# Patient Record
Sex: Female | Born: 1950
Health system: Southern US, Community
[De-identification: ages and names within clinical notes are randomized; demographics above are authoritative.]

## PROBLEM LIST (undated history)

## (undated) DIAGNOSIS — I10 Essential (primary) hypertension: Secondary | ICD-10-CM

## (undated) DIAGNOSIS — E119 Type 2 diabetes mellitus without complications: Secondary | ICD-10-CM

## (undated) DIAGNOSIS — E785 Hyperlipidemia, unspecified: Secondary | ICD-10-CM

## (undated) DIAGNOSIS — B009 Herpesviral infection, unspecified: Secondary | ICD-10-CM

## (undated) HISTORY — DX: Herpesviral infection, unspecified: B00.9

## (undated) HISTORY — DX: Essential (primary) hypertension: I10

## (undated) HISTORY — PX: WISDOM TOOTH EXTRACTION: SHX21

## (undated) HISTORY — DX: Hyperlipidemia, unspecified: E78.5

## (undated) HISTORY — PX: BREAST BIOPSY: SHX20

## (undated) HISTORY — DX: Type 2 diabetes mellitus without complications: E11.9

## (undated) HISTORY — PX: TUBAL LIGATION: SHX77

---

## 1994-06-12 HISTORY — PX: TOE SURGERY: SHX1073

## 1999-01-03 ENCOUNTER — Other Ambulatory Visit: Admission: RE | Admit: 1999-01-03 | Discharge: 1999-01-03 | Payer: Self-pay | Admitting: Obstetrics & Gynecology

## 1999-01-05 ENCOUNTER — Ambulatory Visit (HOSPITAL_COMMUNITY): Admission: RE | Admit: 1999-01-05 | Discharge: 1999-01-05 | Payer: Self-pay | Admitting: Obstetrics and Gynecology

## 1999-01-05 ENCOUNTER — Encounter: Payer: Self-pay | Admitting: Obstetrics and Gynecology

## 1999-01-10 ENCOUNTER — Ambulatory Visit (HOSPITAL_COMMUNITY): Admission: RE | Admit: 1999-01-10 | Discharge: 1999-01-10 | Payer: Self-pay | Admitting: Obstetrics and Gynecology

## 1999-01-10 ENCOUNTER — Encounter: Payer: Self-pay | Admitting: Obstetrics and Gynecology

## 1999-01-18 ENCOUNTER — Encounter: Payer: Self-pay | Admitting: Obstetrics and Gynecology

## 1999-01-18 ENCOUNTER — Ambulatory Visit (HOSPITAL_COMMUNITY): Admission: RE | Admit: 1999-01-18 | Discharge: 1999-01-18 | Payer: Self-pay | Admitting: Obstetrics and Gynecology

## 1999-01-18 ENCOUNTER — Encounter (INDEPENDENT_AMBULATORY_CARE_PROVIDER_SITE_OTHER): Payer: Self-pay | Admitting: Specialist

## 2000-04-23 ENCOUNTER — Other Ambulatory Visit: Admission: RE | Admit: 2000-04-23 | Discharge: 2000-04-23 | Payer: Self-pay | Admitting: Obstetrics and Gynecology

## 2001-02-06 ENCOUNTER — Ambulatory Visit (HOSPITAL_COMMUNITY): Admission: RE | Admit: 2001-02-06 | Discharge: 2001-02-06 | Payer: Self-pay | Admitting: Obstetrics and Gynecology

## 2001-02-06 ENCOUNTER — Encounter: Payer: Self-pay | Admitting: Obstetrics and Gynecology

## 2001-05-21 ENCOUNTER — Other Ambulatory Visit: Admission: RE | Admit: 2001-05-21 | Discharge: 2001-05-21 | Payer: Self-pay | Admitting: Obstetrics and Gynecology

## 2001-06-17 ENCOUNTER — Ambulatory Visit (HOSPITAL_COMMUNITY): Admission: RE | Admit: 2001-06-17 | Discharge: 2001-06-17 | Payer: Self-pay | Admitting: Gastroenterology

## 2002-11-06 ENCOUNTER — Encounter: Payer: Self-pay | Admitting: Obstetrics and Gynecology

## 2002-11-06 ENCOUNTER — Ambulatory Visit (HOSPITAL_COMMUNITY): Admission: RE | Admit: 2002-11-06 | Discharge: 2002-11-06 | Payer: Self-pay | Admitting: Obstetrics and Gynecology

## 2003-02-03 ENCOUNTER — Other Ambulatory Visit: Admission: RE | Admit: 2003-02-03 | Discharge: 2003-02-03 | Payer: Self-pay | Admitting: Obstetrics and Gynecology

## 2003-12-17 ENCOUNTER — Ambulatory Visit (HOSPITAL_COMMUNITY): Admission: RE | Admit: 2003-12-17 | Discharge: 2003-12-17 | Payer: Self-pay | Admitting: Obstetrics and Gynecology

## 2004-02-17 ENCOUNTER — Other Ambulatory Visit: Admission: RE | Admit: 2004-02-17 | Discharge: 2004-02-17 | Payer: Self-pay | Admitting: Obstetrics and Gynecology

## 2005-03-15 ENCOUNTER — Other Ambulatory Visit: Admission: RE | Admit: 2005-03-15 | Discharge: 2005-03-15 | Payer: Self-pay | Admitting: Obstetrics and Gynecology

## 2006-03-27 ENCOUNTER — Other Ambulatory Visit: Admission: RE | Admit: 2006-03-27 | Discharge: 2006-03-27 | Payer: Self-pay | Admitting: Obstetrics and Gynecology

## 2006-10-30 ENCOUNTER — Ambulatory Visit (HOSPITAL_COMMUNITY): Admission: RE | Admit: 2006-10-30 | Discharge: 2006-10-30 | Payer: Self-pay | Admitting: Obstetrics and Gynecology

## 2007-12-20 ENCOUNTER — Ambulatory Visit (HOSPITAL_COMMUNITY): Admission: RE | Admit: 2007-12-20 | Discharge: 2007-12-20 | Payer: Self-pay | Admitting: Obstetrics and Gynecology

## 2009-01-08 ENCOUNTER — Ambulatory Visit (HOSPITAL_COMMUNITY): Admission: RE | Admit: 2009-01-08 | Discharge: 2009-01-08 | Payer: Self-pay | Admitting: Obstetrics and Gynecology

## 2010-07-02 ENCOUNTER — Encounter: Payer: Self-pay | Admitting: Obstetrics and Gynecology

## 2010-07-03 ENCOUNTER — Encounter: Payer: Self-pay | Admitting: Obstetrics and Gynecology

## 2010-10-28 NOTE — Procedures (Signed)
[place]  Patient:    Renee [name], Renee [name] Visit Number: [id number] MRN: [id number]          Service Type: Attending:  Florencia Reasons, M.D. Dictated by:   Florencia Reasons, M.D. Proc. Date: 06/17/01   CC:         Lanora Manis A. Asencion Islam, M.D.  Janine Limbo, M.D.   Procedure Report  PROCEDURE:  Colonoscopy.  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  FINDINGS:  Normal exam to the cecum.  DESCRIPTION OF PROCEDURE:  The nature, purpose, and risks of the procedure had been discussed with the patient, who provided written consent.  Sedation was fentanyl 80 mcg and Versed 8 mg IV prior to and during the course of the procedure without arrhythmias or desaturation.  The Olympus adjustable-tension pediatric video colonoscope was advanced to the cecum, and pullback was then performed.  The quality of the prep was excellent, and it was felt that all areas were well-seen.  There was no particular difficulty in advancing the scope.  This was a normal examination.  No polyps, cancer, colitis, vascular malformations, or diverticulosis were noted.  Retroflexion was attempted in the rectum but could not be accomplished due to the small rectal ampulla.  No biopsies were obtained.  The patient tolerated the procedure well, and there were no apparent complications.  IMPRESSION:  Normal screening colonoscopy.  PLAN:  Consider screening colonoscopy again in five years. Dictated by:   Florencia Reasons, M.D. Attending:  Florencia Reasons, M.D. DD:  06/17/01 TD:  06/17/01 Job: [id number]

## 2010-12-05 ENCOUNTER — Other Ambulatory Visit: Payer: Self-pay | Admitting: Family Medicine

## 2010-12-06 ENCOUNTER — Other Ambulatory Visit: Payer: Self-pay | Admitting: Family Medicine

## 2010-12-06 ENCOUNTER — Other Ambulatory Visit: Payer: Self-pay

## 2010-12-06 DIAGNOSIS — R102 Pelvic and perineal pain: Secondary | ICD-10-CM

## 2011-01-02 ENCOUNTER — Other Ambulatory Visit (HOSPITAL_COMMUNITY): Payer: Self-pay | Admitting: Obstetrics and Gynecology

## 2011-01-02 DIAGNOSIS — Z1231 Encounter for screening mammogram for malignant neoplasm of breast: Secondary | ICD-10-CM

## 2011-01-03 ENCOUNTER — Ambulatory Visit (HOSPITAL_COMMUNITY)
Admission: RE | Admit: 2011-01-03 | Discharge: 2011-01-03 | Disposition: A | Discharge status: Home / Self Care | Payer: BC Managed Care – PPO | Source: Ambulatory Visit | Attending: Obstetrics and Gynecology | Admitting: Obstetrics and Gynecology

## 2011-01-03 DIAGNOSIS — Z1231 Encounter for screening mammogram for malignant neoplasm of breast: Secondary | ICD-10-CM

## 2012-01-26 ENCOUNTER — Other Ambulatory Visit: Payer: Self-pay | Admitting: Obstetrics and Gynecology

## 2012-01-26 DIAGNOSIS — Z1231 Encounter for screening mammogram for malignant neoplasm of breast: Secondary | ICD-10-CM

## 2012-02-20 ENCOUNTER — Ambulatory Visit (HOSPITAL_COMMUNITY): Payer: BC Managed Care – PPO | Attending: Obstetrics and Gynecology

## 2012-03-05 ENCOUNTER — Ambulatory Visit (HOSPITAL_COMMUNITY)
Admission: RE | Admit: 2012-03-05 | Discharge: 2012-03-05 | Disposition: A | Discharge status: Home / Self Care | Payer: BC Managed Care – PPO | Source: Ambulatory Visit | Attending: Obstetrics and Gynecology | Admitting: Obstetrics and Gynecology

## 2012-03-05 DIAGNOSIS — Z1231 Encounter for screening mammogram for malignant neoplasm of breast: Secondary | ICD-10-CM | POA: Insufficient documentation

## 2012-04-01 ENCOUNTER — Ambulatory Visit: Payer: BC Managed Care – PPO | Admitting: Obstetrics and Gynecology

## 2012-05-20 ENCOUNTER — Encounter: Payer: Self-pay | Admitting: Obstetrics and Gynecology

## 2012-05-20 ENCOUNTER — Ambulatory Visit (INDEPENDENT_AMBULATORY_CARE_PROVIDER_SITE_OTHER): Payer: BC Managed Care – PPO | Admitting: Obstetrics and Gynecology

## 2012-05-20 VITALS — BP 120/78 | Ht 64.6 in | Wt 181.0 lb

## 2012-05-20 DIAGNOSIS — Z01419 Encounter for gynecological examination (general) (routine) without abnormal findings: Secondary | ICD-10-CM

## 2012-05-20 NOTE — Progress Notes (Signed)
Subjective:    Renee [name] is a 61 y.o. female G2P2002 who presents for annual exam. The patient complaints of double vision.  She is scheduled to see a neurologist. The patient has a history of herpes but has infrequent outbreaks.  Work and home are going well at this point.  She is not sexually active.  The following portions of the patient's history were reviewed and updated as appropriate: allergies, current medications, past family history, past medical history, past social history, past surgical history and problem list.  Review of Systems Pertinent items are noted in HPI. Gastrointestinal:No change in bowel habits, no abdominal pain, no rectal bleeding Genitourinary:negative for dysuria, frequency, hematuria, nocturia and urinary incontinence    Objective:     BP 120/78  Ht 5' 4.6" (1.641 m)  Wt 181 lb (82.101 kg)  BMI 30.49 kg/m2  Weight:  Wt Readings from Last 1 Encounters:  05/20/12 181 lb (82.101 kg)     BMI: Body mass index is 30.49 kg/(m^2). General Appearance: Alert, appropriate appearance for age. No acute distress HEENT: Grossly normal Neck / Thyroid: Supple, no masses, nodes or enlargement Lungs: clear to auscultation bilaterally Back: No CVA tenderness Breast Exam: No masses or nodes.No dimpling, nipple retraction or discharge. Cardiovascular: Regular rate and rhythm. S1, S2, no murmur Gastrointestinal: Soft, non-tender, no masses or organomegaly  ++++++++++++++++++++++++++++++++++++++++++++++++++++++++  Pelvic Exam: External genitalia: normal general appearance Vaginal: normal without tenderness, induration or masses and relaxation noted Cervix: normal appearance Adnexa: normal bimanual exam Uterus: normal size shape and consistency Rectovaginal: normal rectal, no masses  ++++++++++++++++++++++++++++++++++++++++++++++++++++++++  Lymphatic Exam: Non-palpable nodes in neck, clavicular, axillary, or inguinal regions  Psychiatric: Alert and  oriented, appropriate affect.      Assessment:    Normal gyn exam Menopause   Family history of colon cancer  Overweight or obese: Yes  Pelvic relaxation: Yes  Menopausal symptoms: No. Severe: No.   Plan:    Mammogram. Pap smear. colonoscopy   Follow-up:  for annual exam  The updated Pap smear screening guidelines were discussed with the patient. The patient requested that I obtain a Pap smear: Yes.  Kegel exercises discussed: Yes.  Proper diet and regular exercise were reviewed.  Annual mammograms recommended starting at age 51. Proper breast care was discussed.  Screening colonoscopy is recommended beginning at age 66.  Regular health maintenance was reviewed.  Sleep hygiene was discussed.  Adequate calcium and vitamin D intake was emphasized.  The patient declines a prescription for Valtrex  Leonard Schwartz M.D.   Regular Periods: no Mammogram: yes  Monthly Breast Ex.: yes Exercise: yes  Tetanus < 10 years: yes Seatbelts: yes  NI. Bladder Functn.: yes Abuse at home: no  Daily BM's: no Stressful Work: no  Healthy Diet: yes Sigmoid-Colonoscopy: 2010  Calcium: yes Medical problems this year: Double vision    LAST PAP:04/2009  Contraception: BTL   Mammogram:  03/06/2012  [facility]P: [facility]   PMH: unchanged  FMH: unchanged   Last Bone Scan: never had one

## 2012-05-21 LAB — PAP IG W/ RFLX HPV ASCU

## 2012-05-22 DIAGNOSIS — R51 Headache: Secondary | ICD-10-CM | POA: Insufficient documentation

## 2012-05-22 DIAGNOSIS — H532 Diplopia: Secondary | ICD-10-CM | POA: Insufficient documentation

## 2012-05-22 DIAGNOSIS — R519 Headache, unspecified: Secondary | ICD-10-CM | POA: Insufficient documentation

## 2012-05-25 ENCOUNTER — Encounter: Payer: Self-pay | Admitting: *Deleted

## 2012-05-25 DIAGNOSIS — R51 Headache: Secondary | ICD-10-CM

## 2012-05-25 DIAGNOSIS — H532 Diplopia: Secondary | ICD-10-CM

## 2012-08-29 ENCOUNTER — Ambulatory Visit: Payer: Self-pay | Admitting: Neurology

## 2013-04-07 DIAGNOSIS — H209 Unspecified iridocyclitis: Secondary | ICD-10-CM | POA: Insufficient documentation

## 2013-06-20 ENCOUNTER — Other Ambulatory Visit: Payer: Self-pay | Admitting: Obstetrics and Gynecology

## 2013-06-20 DIAGNOSIS — Z1231 Encounter for screening mammogram for malignant neoplasm of breast: Secondary | ICD-10-CM

## 2013-06-23 ENCOUNTER — Ambulatory Visit (HOSPITAL_COMMUNITY): Payer: BC Managed Care – PPO

## 2013-06-24 ENCOUNTER — Ambulatory Visit (HOSPITAL_COMMUNITY)
Admission: RE | Admit: 2013-06-24 | Discharge: 2013-06-24 | Disposition: A | Discharge status: Home / Self Care | Payer: BC Managed Care – PPO | Source: Ambulatory Visit | Attending: Obstetrics and Gynecology | Admitting: Obstetrics and Gynecology

## 2013-06-24 DIAGNOSIS — Z1231 Encounter for screening mammogram for malignant neoplasm of breast: Secondary | ICD-10-CM | POA: Insufficient documentation

## 2013-06-26 ENCOUNTER — Other Ambulatory Visit: Payer: Self-pay | Admitting: Obstetrics and Gynecology

## 2013-06-26 DIAGNOSIS — R928 Other abnormal and inconclusive findings on diagnostic imaging of breast: Secondary | ICD-10-CM

## 2013-07-01 DIAGNOSIS — H5000 Unspecified esotropia: Secondary | ICD-10-CM | POA: Insufficient documentation

## 2013-07-07 ENCOUNTER — Ambulatory Visit
Admission: RE | Admit: 2013-07-07 | Discharge: 2013-07-07 | Disposition: A | Discharge status: Home / Self Care | Payer: BC Managed Care – PPO | Source: Ambulatory Visit | Attending: Obstetrics and Gynecology | Admitting: Obstetrics and Gynecology

## 2013-07-07 DIAGNOSIS — R928 Other abnormal and inconclusive findings on diagnostic imaging of breast: Secondary | ICD-10-CM

## 2013-08-11 ENCOUNTER — Other Ambulatory Visit: Payer: Self-pay | Admitting: Interventional Cardiology

## 2013-10-25 ENCOUNTER — Other Ambulatory Visit: Payer: Self-pay | Admitting: Interventional Cardiology

## 2014-01-07 ENCOUNTER — Other Ambulatory Visit: Payer: Self-pay | Admitting: Interventional Cardiology

## 2014-01-30 ENCOUNTER — Telehealth: Payer: Self-pay | Admitting: Interventional Cardiology

## 2014-01-30 NOTE — Telephone Encounter (Signed)
New message     Pt thinks she is having problems with her medication.  She says she has "breathing" problems sometimes.  Please advise.

## 2014-01-30 NOTE — Telephone Encounter (Signed)
returned pt call. pt sts that she is having increased fatigue, sob and a "little swelling"LE . pt denies chest pain, palpitations. pt believes her symptoms are related to Amlodipine.pt does not measure bp regularly.pt av to measure bp 1-2 wk and bring readings to o/v. pt is past due for f/u with Dr.Smith appt sch. for 9/18. pt adv to call if symptoms worsen. Adv pt Dr.Smith is back in the office on 8/26. I will fwd him a message and call back with his recommendations. Pt agreeable and verbalized understanding.

## 2014-02-09 ENCOUNTER — Other Ambulatory Visit: Payer: Self-pay | Admitting: Interventional Cardiology

## 2014-02-27 ENCOUNTER — Encounter: Payer: Self-pay | Admitting: Interventional Cardiology

## 2014-02-27 ENCOUNTER — Ambulatory Visit (INDEPENDENT_AMBULATORY_CARE_PROVIDER_SITE_OTHER): Payer: BC Managed Care – PPO | Admitting: Interventional Cardiology

## 2014-02-27 VITALS — BP 118/74 | HR 86 | Ht 64.0 in | Wt 185.0 lb

## 2014-02-27 DIAGNOSIS — R0609 Other forms of dyspnea: Secondary | ICD-10-CM

## 2014-02-27 DIAGNOSIS — E785 Hyperlipidemia, unspecified: Secondary | ICD-10-CM | POA: Insufficient documentation

## 2014-02-27 DIAGNOSIS — I10 Essential (primary) hypertension: Secondary | ICD-10-CM

## 2014-02-27 DIAGNOSIS — G4719 Other hypersomnia: Secondary | ICD-10-CM

## 2014-02-27 DIAGNOSIS — R0683 Snoring: Secondary | ICD-10-CM

## 2014-02-27 DIAGNOSIS — R0989 Other specified symptoms and signs involving the circulatory and respiratory systems: Secondary | ICD-10-CM

## 2014-02-27 DIAGNOSIS — G4710 Hypersomnia, unspecified: Secondary | ICD-10-CM

## 2014-02-27 MED ORDER — AMLODIPINE BESYLATE 5 MG PO TABS: ORAL_TABLET | ORAL | Status: DC

## 2014-02-27 MED ORDER — VALSARTAN-HYDROCHLOROTHIAZIDE 320-25 MG PO TABS: ORAL_TABLET | ORAL | Status: DC

## 2014-02-27 NOTE — Progress Notes (Signed)
Patient ID: Renee [name], female   DOB: [birth year], 63 y.o.   MRN: [id number]    [address] [place], [place]  [address] Phone: [phone] Fax:  [phone]  Date:  02/27/2014   ID:  Renee [name], DOB 1950-11-04, MRN [id number]  PCP:  Pcp Not In System   ASSESSMENT:  1. Hyperlipidemia 2. Hypertension 3. Double vision 4. Excessive daytime sleepiness associated with snoring  PLAN:  1. Consider sleep study 2. Lipid NMR 3. Renew antihypertensive therapy   SUBJECTIVE: Renee [name] is a 63 y.o. female who has an occasional hiccup and she wonders if this could be related to her heart. She also has complaint of excessive daytime sleepiness. Her children complaining that she snores loudly. She feels sleepy L. the time. There is no energy. She states in am responsible for prescribing her blood pressure medications. Has no significant cardiac complaints. She denies orthopnea and PND. She is having trouble with double vision and his are present.   Wt Readings from Last 3 Encounters:  02/27/14 185 lb (83.915 kg)  05/22/12 182 lb (82.555 kg)  05/20/12 181 lb (82.101 kg)     Past Medical History  Diagnosis Date  . Hypertension   . Hyperlipidemia   . HSV infection     Current Outpatient Prescriptions  Medication Sig Dispense Refill  . amLODipine (NORVASC) 5 MG tablet TAKE 1 TABLET EVERY DAY  30 tablet  0  . aspirin 81 MG tablet Take 81 mg by mouth daily.      . RESTASIS 0.05 % ophthalmic emulsion       . valACYclovir (VALTREX) 500 MG tablet       . valsartan-hydrochlorothiazide (DIOVAN-HCT) 320-25 MG per tablet TAKE 1 TABLET EVERY DAY  30 tablet  0   No current facility-administered medications for this visit.    Allergies:   No Known Allergies  Social History:  The patient  reports that she has never smoked. She has never used smokeless tobacco. She reports that she drinks alcohol. She reports that she does not use illicit drugs.    ROS:  Please see the history of present illness.   No neurological complaints of double vision. This was evaluated at [facility] and no brain lesion or evidence of cerebrovascular disease was identified. She denies peripheral edema.   All other systems reviewed and negative.   OBJECTIVE: VS:  BP 118/74  Pulse 86  Ht  (1.626 m)  Wt 185 lb (83.915 kg)  BMI 31.74 kg/m2 Well nourished, well developed, in no acute distress, wearing glasses and have a prism in the right lens HEENT: normal Neck: JVD flat. Carotid bruit absent  Cardiac:  normal S1, S2; RRR; no murmur Lungs:  clear to auscultation bilaterally, no wheezing, rhonchi or rales Abd: soft, nontender, no hepatomegaly Ext: Edema absent. Pulses 2+ Skin: warm and dry Neuro:  CNs 2-12 intact, no focal abnormalities noted  EKG:  Poor R-wave progression. Otherwise normal       Signed, Darci Needle III, MD 02/27/2014 4:40 PM

## 2014-02-27 NOTE — Patient Instructions (Signed)
Your physician recommends that you continue on your current medications as directed. Please refer to the Current Medication list given to you today.  Your physician recommends that you return for a FASTING NMR lipid profile: 03/03/14 between 7:30am-5:15pm  Your physician wants you to follow-up in: 1 year with Dr.Smith You will receive a reminder letter in the mail two months in advance. If you don't receive a letter, please call our office to schedule the follow-up appointment.

## 2014-03-03 ENCOUNTER — Other Ambulatory Visit: Payer: BC Managed Care – PPO

## 2014-04-13 ENCOUNTER — Encounter: Payer: Self-pay | Admitting: Interventional Cardiology

## 2014-04-29 DIAGNOSIS — H50021 Monocular esotropia with A pattern, right eye: Secondary | ICD-10-CM | POA: Insufficient documentation

## 2014-05-05 DIAGNOSIS — H04123 Dry eye syndrome of bilateral lacrimal glands: Secondary | ICD-10-CM | POA: Insufficient documentation

## 2014-11-17 ENCOUNTER — Ambulatory Visit (INDEPENDENT_AMBULATORY_CARE_PROVIDER_SITE_OTHER): Payer: Self-pay | Admitting: Neurology

## 2014-11-17 ENCOUNTER — Telehealth: Payer: Self-pay | Admitting: *Deleted

## 2014-11-17 ENCOUNTER — Encounter: Payer: Self-pay | Admitting: Neurology

## 2014-11-17 VITALS — BP 126/78 | HR 76 | Temp 97.8°F | Ht 64.0 in | Wt 182.4 lb

## 2014-11-17 DIAGNOSIS — F0781 Postconcussional syndrome: Secondary | ICD-10-CM

## 2014-11-17 NOTE — Telephone Encounter (Deleted)
Spoke with pt who stated she has not gone to get MRI and believes she "does not need MRI". I advised her that Dr. Ahern would like her to have this done and she then was confused between different tests she has had done and which doctor's have done which test. Unable to tell me where she was going for MRI for "her appointment on Tuesday next week". She stated "It's off of [address], I will know when I get there". I told her I would have to talk with Dr. Ahern and call her back. Pt verbalized understanding.   I also told her Dr. Wilson office would not give her something before her MRI because he wanted Dr. Ahern to do this since she is the once who ordered the MRI. Pt stated she was not aware and stated "I asked them to fill prescription at my pharmacy and that never happened". I told her Dr. Wilson office has been trying to contact her also about this issue and that I spoke with Gayle from their office about this. Pt aware.  

## 2014-11-17 NOTE — Telephone Encounter (Signed)
There is no order showing for this patient in the chart.
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
## 2014-11-17 NOTE — Patient Instructions (Signed)
Overall you are doing fairly well but I do want to suggest a few things today:   Remember to drink plenty of fluid, eat healthy meals and do not skip any meals. Try to eat protein with a every meal and eat a healthy snack such as fruit or nuts in between meals. Try to keep a regular sleep-wake schedule and try to exercise daily, particularly in the form of walking, 20-30 minutes a day, if you can.   I would like to see you back in 3 months, sooner if we need to. Please call us with any interim questions, concerns, problems, updates or refill requests.   Please also call us for any test results so we can go over those with you on the phone.  My clinical assistant and will answer any of your questions and relay your messages to me and also relay most of my messages to you.   Our phone number is [phone]. We also have an after hours call service for urgent matters and there is a physician on-call for urgent questions. For any emergencies you know to call 911 or go to the nearest emergency room   

## 2014-11-17 NOTE — Telephone Encounter (Signed)
I think there is a mistake. I just saw this patient today and I did not order an MRI. Please recheck chart and ensure this is the right patient, thanks.

## 2014-11-17 NOTE — Telephone Encounter (Signed)
Documentation made on 11/17/14 at 2:55pm and note was deleted on 11/17/14 at 3:56pm. This was intended for another pt chart. Dr. Lucia GaskinsAhern and Venita LickJodi Isom also made me aware of mistake.

## 2015-02-17 ENCOUNTER — Ambulatory Visit (INDEPENDENT_AMBULATORY_CARE_PROVIDER_SITE_OTHER): Payer: BC Managed Care – PPO | Admitting: Neurology

## 2015-02-17 ENCOUNTER — Encounter: Payer: Self-pay | Admitting: Neurology

## 2015-02-17 VITALS — BP 155/84 | HR 73 | Ht 64.0 in | Wt 185.0 lb

## 2015-02-17 DIAGNOSIS — F0781 Postconcussional syndrome: Secondary | ICD-10-CM

## 2015-02-17 NOTE — Patient Instructions (Signed)
Overall you are doing fairly well but I do want to suggest a few things today:   Remember to drink plenty of fluid, eat healthy meals and do not skip any meals. Try to eat protein with a every meal and eat a healthy snack such as fruit or nuts in between meals. Try to keep a regular sleep-wake schedule and try to exercise daily, particularly in the form of walking, 20-30 minutes a day, if you can.   As far as your medications are concerned, I would like to suggest  As far as diagnostic testing:   I would like to see you back as needed, sooner if we need to. Please call us with any interim questions, concerns, problems, updates or refill requests.   Please also call us for any test results so we can go over those with you on the phone.  My clinical assistant and will answer any of your questions and relay your messages to me and also relay most of my messages to you.   Our phone number is [phone]. We also have an after hours call service for urgent matters and there is a physician on-call for urgent questions. For any emergencies you know to call 911 or go to the nearest emergency room
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
## 2015-03-09 DIAGNOSIS — H50012 Monocular esotropia, left eye: Secondary | ICD-10-CM | POA: Insufficient documentation

## 2015-03-09 DIAGNOSIS — H4922 Sixth [abducent] nerve palsy, left eye: Secondary | ICD-10-CM | POA: Insufficient documentation

## 2015-04-02 ENCOUNTER — Other Ambulatory Visit: Payer: Self-pay | Admitting: Interventional Cardiology

## 2015-05-13 ENCOUNTER — Other Ambulatory Visit: Payer: Self-pay | Admitting: Interventional Cardiology

## 2015-05-14 ENCOUNTER — Ambulatory Visit: Payer: BC Managed Care – PPO | Admitting: Interventional Cardiology

## 2015-05-14 ENCOUNTER — Other Ambulatory Visit: Payer: Self-pay | Admitting: Obstetrics and Gynecology

## 2015-05-14 DIAGNOSIS — N6489 Other specified disorders of breast: Secondary | ICD-10-CM

## 2015-05-27 ENCOUNTER — Other Ambulatory Visit: Payer: Self-pay | Admitting: Interventional Cardiology

## 2015-06-16 NOTE — Progress Notes (Signed)
Cardiology Office Note   Date:  06/17/2015   ID:  Renee SkeensGwendolyn Gravlin-Wade, DOB July 22, 1950, MRN [id number]  PCP:  Irving CopasHACKER,ROBERT KELLER, MD  Cardiologist:  Lesleigh NoeSMITH III,Elder Davidian W, MD   Chief Complaint  Patient presents with  . Hypertension      History of Present Illness: Renee [name] is a [age] y.o. female who presents for extreme hyperlipidemia, essential hypertension, and recent history of right sixth nerve palsy.  Doing well. No orthopnea, PND, or chest discomfort. She has no edema or other cardiovascular complaints. She is concerned about when to take her antihypertensive therapy and also the influence of grapefruit juice on absorption  She asks if she is healthy enough to undergo surgery on her right eye for VI nerve palsy   Past Medical History  Diagnosis Date  . Hypertension   . Hyperlipidemia   . HSV infection     Past Surgical History  Procedure Laterality Date  . Tubal ligation    . Toe surgery  1996  . Wisdom tooth extraction       Current Outpatient Prescriptions  Medication Sig Dispense Refill  . amLODipine (NORVASC) 5 MG tablet Take 1 tablet (5 mg total) by mouth daily. 30 tablet 1  . aspirin 81 MG tablet Take 81 mg by mouth daily.    . diclofenac (VOLTAREN) 75 MG EC tablet Take 75 mg by mouth 2 (two) times daily with a meal.  0  . dorzolamide-timolol (COSOPT) 22.3-6.8 MG/ML ophthalmic solution Place 1 drop into both eyes daily.     . valsartan-hydrochlorothiazide (DIOVAN-HCT) 320-25 MG tablet Take 1 tablet by mouth daily. 30 tablet 0   No current facility-administered medications for this visit.    Allergies:   Review of patient's allergies indicates no known allergies.    Social History:  The patient  reports that she has never smoked. She has never used smokeless tobacco. She reports that she drinks alcohol. She reports that she does not use illicit drugs.   Family History:  The patient's family history includes Breast cancer in her  maternal aunt and sister; Colon cancer in her paternal uncle and paternal uncle; Hyperlipidemia in her maternal grandmother and mother; Hypertension in her maternal grandmother and mother; Lung cancer in her father.    ROS:  Please see the history of present illness.   Otherwise, review of systems are positive for double vision, eye discomfort, and anxiety..   All other systems are reviewed and negative.    PHYSICAL EXAM: VS:  BP 134/88 mmHg  Pulse 77  Ht 5' 4.5" (1.638 m)  Wt 189 lb (85.73 kg)  BMI 31.95 kg/m2 , BMI Body mass index is 31.95 kg/(m^2). GEN: Well nourished, well developed, in no acute distress HEENT: normal Neck: no JVD, carotid bruits, or masses Cardiac: RRR.  There is no murmur, rub, or gallop. There is no edema. Respiratory:  clear to auscultation bilaterally, normal work of breathing. GI: soft, nontender, nondistended, + BS MS: no deformity or atrophy Skin: warm and dry, no rash Neuro:  Strength and sensation are intact Psych: euthymic mood, full affect   EKG:  EKG is ordered today. The ekg reveals normal sinus rhythm with leftward axis and nonspecific T wave flattening.   Recent Labs: No results found for requested labs within last 365 days.    Lipid Panel No results found for: CHOL, TRIG, HDL, CHOLHDL, VLDL, LDLCALC, LDLDIRECT    Wt Readings from Last 3 Encounters:  06/17/15 189 lb (85.73 kg)  02/17/15  185 lb (83.915 kg)  11/17/14 182 lb 6.4 oz (82.736 kg)      Other studies Reviewed: Additional studies/ records that were reviewed today include: none. The findings include none.    ASSESSMENT AND PLAN:  1. Essential hypertension Excellent control  2. Hyperlipidemia There is a history of extreme lipid elevation. She has been reluctant to have therapy.  3. Snoring Still happening but not addressed  4. Right sixth nerve palsy She will have corrective surgery soon.   Current medicines are reviewed at length with the patient today.  The  patient has the following concerns regarding medicines: None.  The following changes/actions have been instituted:    Cardio IQ  Basic metabolic panel  Cleared for upcoming ophthalmologic surgery.  Labs/ tests ordered today include:  No orders of the defined types were placed in this encounter.     Disposition:   FU with HS in 1 year  Signed, Lesleigh Noe, MD  06/17/2015 2:24 PM    [facility] [address] [place], [place], [place]  [address] Phone: [phone]; Fax: [phone]

## 2015-06-17 ENCOUNTER — Encounter: Payer: Self-pay | Admitting: Interventional Cardiology

## 2015-06-17 ENCOUNTER — Ambulatory Visit (INDEPENDENT_AMBULATORY_CARE_PROVIDER_SITE_OTHER): Payer: BC Managed Care – PPO | Admitting: Interventional Cardiology

## 2015-06-17 VITALS — BP 134/88 | HR 77 | Ht 64.5 in | Wt 189.0 lb

## 2015-06-17 DIAGNOSIS — R0683 Snoring: Secondary | ICD-10-CM | POA: Diagnosis not present

## 2015-06-17 DIAGNOSIS — E785 Hyperlipidemia, unspecified: Secondary | ICD-10-CM | POA: Diagnosis not present

## 2015-06-17 DIAGNOSIS — I10 Essential (primary) hypertension: Secondary | ICD-10-CM

## 2015-06-17 NOTE — Patient Instructions (Signed)
Medication Instructions:  Your physician recommends that you continue on your current medications as directed. Please refer to the Current Medication list given to you today.   Labwork: Cardio IQ, Bmet today  Testing/Procedures: None ordered  Follow-Up: Your physician wants you to follow-up in: 1 year with Dr.Smith You will receive a reminder letter in the mail two months in advance. If you don't receive a letter, please call our office to schedule the follow-up appointment.   Any Other Special Instructions Will Be Listed Below (If Applicable).     If you need a refill on your cardiac medications before your next appointment, please call your pharmacy.

## 2015-06-18 LAB — BASIC METABOLIC PANEL
BUN: 13 mg/dL (ref 7–25)
CHLORIDE: 100 mmol/L (ref 98–110)
CO2: 30 mmol/L (ref 20–31)
CREATININE: 0.69 mg/dL (ref 0.50–0.99)
Calcium: 10.1 mg/dL (ref 8.6–10.4)
GLUCOSE: 120 mg/dL — AB (ref 65–99)
Potassium: 3.8 mmol/L (ref 3.5–5.3)
Sodium: 140 mmol/L (ref 135–146)

## 2015-06-23 LAB — CARDIO IQ(R) ADVANCED LIPID PANEL
Apolipoprotein B: 182 mg/dL — ABNORMAL HIGH (ref 49–103)
Cholesterol, Total: 346 mg/dL — ABNORMAL HIGH (ref 125–200)
Cholesterol/HDL Ratio: 4.9 calc (ref <=5.0)
HDL CHOLESTEROL (CARDIO IQ ADV LIPID PANEL): 71 mg/dL (ref >=46)
LDL CHOLESTEROL CALCULATED (CARDIO IQ ADV LIPID PANEL): 257 mg/dL — AB
LDL LARGE: 6287 nmol/L (ref 5038–17886)
LDL MEDIUM: 606 nmol/L — AB (ref 121–397)
LDL Particle Number: 2597 nmol/L — ABNORMAL HIGH (ref 1016–2185)
LDL Peak Size: 222.9 Angstrom (ref >=218.2)
LDL SMALL: 317 nmol/L (ref 115–386)
LIPOPROTEIN (A) (CARDIO IQ ADV LIPID PANEL): 104 nmol/L — AB (ref <75)
Non-HDL Cholesterol: 275 mg/dL
Triglycerides: 89 mg/dL

## 2015-06-28 ENCOUNTER — Other Ambulatory Visit: Payer: BC Managed Care – PPO

## 2015-06-28 ENCOUNTER — Telehealth: Payer: Self-pay

## 2015-06-28 DIAGNOSIS — E785 Hyperlipidemia, unspecified: Secondary | ICD-10-CM

## 2015-06-28 MED ORDER — ROSUVASTATIN CALCIUM 20 MG PO TABS: 20.0000 mg | ORAL_TABLET | Freq: Every day | ORAL | Status: DC

## 2015-06-28 NOTE — Telephone Encounter (Signed)
Pt aware of lab results and Dr.Smith's recommendation. Lipids are abnormal with multiple high risk features. I would recommend starting therapy. My suggestion would be Crestor 20 mg daily. This would prevent development of vascular obstruction in the future. Fasting liver and lipid in 6 weeks. After some discussion pt is agreeable with plan. Rx sent to pt pharmacy. Pt will call back to schedule her fasting lab appt for 6 weeks. Lab recall in epic.

## 2015-06-28 NOTE — Telephone Encounter (Signed)
-----   Message from Lyn RecordsHenry W Smith, MD sent at 06/21/2015  9:38 AM EST ----- Electrolytes are normal but blood sugar elevated. Needs to have this followed by PCP.

## 2015-07-02 ENCOUNTER — Ambulatory Visit
Admission: RE | Admit: 2015-07-02 | Discharge: 2015-07-02 | Disposition: A | Discharge status: Home / Self Care | Payer: BC Managed Care – PPO | Source: Ambulatory Visit | Attending: Obstetrics and Gynecology | Admitting: Obstetrics and Gynecology

## 2015-07-02 DIAGNOSIS — N6489 Other specified disorders of breast: Secondary | ICD-10-CM

## 2015-07-05 DIAGNOSIS — Z9889 Other specified postprocedural states: Secondary | ICD-10-CM | POA: Insufficient documentation

## 2015-07-24 ENCOUNTER — Other Ambulatory Visit: Payer: Self-pay | Admitting: Interventional Cardiology

## 2015-07-28 ENCOUNTER — Other Ambulatory Visit: Payer: Self-pay | Admitting: Interventional Cardiology

## 2015-12-07 ENCOUNTER — Telehealth: Payer: Self-pay | Admitting: *Deleted

## 2015-12-07 NOTE — Telephone Encounter (Signed)
Pt records and bills faxed to [employer] on 12/07/2015.

## 2016-05-29 ENCOUNTER — Encounter: Payer: BC Managed Care – PPO | Attending: Family Medicine | Admitting: *Deleted

## 2016-05-29 ENCOUNTER — Encounter: Payer: Self-pay | Admitting: *Deleted

## 2016-05-29 DIAGNOSIS — Z713 Dietary counseling and surveillance: Secondary | ICD-10-CM | POA: Diagnosis not present

## 2016-05-29 DIAGNOSIS — E785 Hyperlipidemia, unspecified: Secondary | ICD-10-CM

## 2016-05-29 DIAGNOSIS — I10 Essential (primary) hypertension: Secondary | ICD-10-CM

## 2016-05-29 DIAGNOSIS — E119 Type 2 diabetes mellitus without complications: Secondary | ICD-10-CM

## 2016-05-29 NOTE — Progress Notes (Signed)
Diabetes Self-Management Education  Visit Type: First/Initial  Appt. Start Time: 0830 Appt. End Time: 1000  05/29/2016  [name], identified by name and date of birth, is a 65 y.o. female with a diagnosis of Diabetes: Type 2.   ASSESSMENT  Daughter has type 1 diabetes.  Patient with history of dieting, meal skipping, emotional eating, smoothie cleanses (general disordered eating)      Diabetes Self-Management Education - 05/29/16 0843      Visit Information   Visit Type First/Initial     Initial Visit   Diabetes Type Type 2   Are you currently following a meal plan? No   Are you taking your medications as prescribed? Yes  sometimes forgets   Date Diagnosed 2017     Health Coping   How would you rate your overall health? Good     Psychosocial Assessment   Patient Belief/Attitude about Diabetes Motivated to manage diabetes   Self-care barriers None   Other persons present Patient   Patient Concerns Nutrition/Meal planning;Healthy Lifestyle  meal skipping and then overeating, emotional/binge eating   Special Needs None   Preferred Learning Style Auditory;Visual;Hands on   Learning Readiness Ready   How often do you need to have someone help you when you read instructions, pamphlets, or other written materials from your doctor or pharmacy? 1 - Never   What is the last grade level you completed in school? post graduate     Pre-Education Assessment   Patient understands the diabetes disease and treatment process. Needs Instruction   Patient understands incorporating nutritional management into lifestyle. Needs Instruction   Patient undertands incorporating physical activity into lifestyle. Needs Instruction   Patient understands using medications safely. Needs Instruction   Patient understands monitoring blood glucose, interpreting and using results Needs Instruction   Patient understands prevention, detection, and treatment of acute complications. Needs  Instruction   Patient understands prevention, detection, and treatment of chronic complications. Needs Instruction   Patient understands how to develop strategies to address psychosocial issues. Needs Instruction   Patient understands how to develop strategies to promote health/change behavior. Needs Instruction     Complications   Last HgB A1C per patient/outside source 6.6 %   How often do you check your blood sugar? 1-2 times/day   Fasting Blood glucose range (mg/dL) 16-109;[id number];130-179   Number of hypoglycemic episodes per month 0   Have you had a dilated eye exam in the past 12 months? Yes   Have you had a dental exam in the past 12 months? Yes   Are you checking your feet? No     Dietary Intake   Breakfast salmon, cantaloupe   Lunch peanut butter   Snack (afternoon) pistachios   Dinner 1/2 cornish hen, squash, onions tries to eat before 6     Exercise   Exercise Type Other (comment)  strength training   How many days per week to you exercise? 2   How many minutes per day do you exercise? 40   Total minutes per week of exercise 80     Patient Education   Previous Diabetes Education No   Disease state  Definition of diabetes, type 1 and 2, and the diagnosis of diabetes;Factors that contribute to the development of diabetes;Explored patient's options for treatment of their diabetes   Nutrition management  Role of diet in the treatment of diabetes and the relationship between the three main macronutrients and blood glucose level;Effects of alcohol on blood glucose and safety factors with  consumption of alcohol.;Meal options for control of blood glucose level and chronic complications.;Food label reading, portion sizes and measuring food.;Carbohydrate counting   Physical activity and exercise  Role of exercise on diabetes management, blood pressure control and cardiac health.;Helped patient identify appropriate exercises in relation to his/her diabetes, diabetes complications and other  health issue.   Medications Reviewed patients medication for diabetes, action, purpose, timing of dose and side effects.   Monitoring Yearly dilated eye exam;Identified appropriate SMBG and/or A1C goals.   Acute complications Taught treatment of hypoglycemia - the 15 rule.;Discussed and identified patients' treatment of hyperglycemia.   Chronic complications Relationship between chronic complications and blood glucose control   Personal strategies to promote health Lifestyle issues that need to be addressed for better diabetes care     Individualized Goals (developed by patient)   Nutrition Follow meal plan discussed;General guidelines for healthy choices and portions discussed   Physical Activity Exercise 3-5 times per week   Medications take my medication as prescribed   Monitoring  test my blood glucose as discussed   Reducing Risk examine blood glucose patterns     Post-Education Assessment   Patient understands the diabetes disease and treatment process. Needs Review   Patient understands incorporating nutritional management into lifestyle. Needs Review   Patient undertands incorporating physical activity into lifestyle. Needs Review   Patient understands using medications safely. Needs Review   Patient understands monitoring blood glucose, interpreting and using results Needs Review   Patient understands prevention, detection, and treatment of acute complications. Needs Review   Patient understands prevention, detection, and treatment of chronic complications. Needs Review   Patient understands how to develop strategies to address psychosocial issues. Needs Review   Patient understands how to develop strategies to promote health/change behavior. Needs Review     Outcomes   Expected Outcomes Demonstrated interest in learning. Expect positive outcomes   Future DMSE 4-6 wks   Program Status Not Completed      Individualized Plan for Diabetes Self-Management Training:   Learning  Objective:  Patient will have a greater understanding of diabetes self-management. Patient education plan is to attend individual and/or group sessions per assessed needs and concerns.   Plan:   Read Eat What you Love, Love what you Eat for Diabetes  Expected Outcomes:  Demonstrated interest in learning. Expect positive outcomes  Education material provided: Living Well with Diabetes and Meal plan card  If problems or questions, patient to contact team via:  Phone  Future DSME appointment: 4-6 wks

## 2016-06-23 ENCOUNTER — Other Ambulatory Visit: Payer: Self-pay | Admitting: Obstetrics and Gynecology

## 2016-06-23 DIAGNOSIS — Z1231 Encounter for screening mammogram for malignant neoplasm of breast: Secondary | ICD-10-CM

## 2016-06-29 ENCOUNTER — Ambulatory Visit: Payer: BC Managed Care – PPO | Admitting: *Deleted

## 2016-07-13 ENCOUNTER — Encounter: Payer: BC Managed Care – PPO | Attending: Family Medicine | Admitting: *Deleted

## 2016-07-13 DIAGNOSIS — E119 Type 2 diabetes mellitus without complications: Secondary | ICD-10-CM | POA: Diagnosis not present

## 2016-07-13 DIAGNOSIS — Z713 Dietary counseling and surveillance: Secondary | ICD-10-CM | POA: Insufficient documentation

## 2016-07-13 NOTE — Patient Instructions (Signed)
Aim for physical activity when able.  Goal is 150 min/week You're not getting enough to eat. Period.  Goal is 3 meals/day and snacks as needed Try reading Eat What You Love, Love What You Eat With Diabetes

## 2016-07-13 NOTE — Progress Notes (Signed)
Diabetes Self-Management Education  Visit Type:  Follow-up  Appt. Start Time: 0830 Appt. End Time: 0900  07/13/2016  Ms. Jocabed Gieske-Wade, identified by name and date of birth, is a 66 y.o. female with a diagnosis of Diabetes:  .   ASSESSMENT States she is checking glucose at night and it's normal, but high in the morning.       Diabetes Self-Management Education - 07/13/16 0836      Complications   How often do you check your blood sugar? 3-4 times/day   Fasting Blood glucose range (mg/dL) [phone]   Postprandial Blood glucose range (mg/dL) [phone]     Dietary Intake   Breakfast eggwhite and avocado; oatmeal and scrambled egg white   Lunch apple   Dinner stirfy with chicken; lentil soup      Learning Objective:  Patient will have a greater understanding of diabetes self-management. Patient education plan is to attend individual and/or group sessions per assessed needs and concerns.   Plan:   Patient Instructions  Aim for physical activity when able.  Goal is 150 min/week You're not getting enough to eat. Period.  Goal is 3 meals/day and snacks as needed Try reading Eat What You Love, Love What You Eat With Diabetes    If problems or questions, patient to contact team via:  Phone  Future DSME appointment: -  1 month

## 2016-07-14 ENCOUNTER — Ambulatory Visit
Admission: RE | Admit: 2016-07-14 | Discharge: 2016-07-14 | Disposition: A | Discharge status: Home / Self Care | Payer: BC Managed Care – PPO | Source: Ambulatory Visit | Attending: Obstetrics and Gynecology | Admitting: Obstetrics and Gynecology

## 2016-07-14 DIAGNOSIS — Z1231 Encounter for screening mammogram for malignant neoplasm of breast: Secondary | ICD-10-CM

## 2016-07-18 ENCOUNTER — Encounter: Payer: Self-pay | Admitting: Interventional Cardiology

## 2016-07-18 ENCOUNTER — Ambulatory Visit: Payer: BC Managed Care – PPO

## 2016-07-18 ENCOUNTER — Ambulatory Visit (INDEPENDENT_AMBULATORY_CARE_PROVIDER_SITE_OTHER): Payer: BC Managed Care – PPO | Admitting: Interventional Cardiology

## 2016-07-18 VITALS — BP 132/72 | HR 80 | Ht 64.0 in | Wt 178.4 lb

## 2016-07-18 DIAGNOSIS — I10 Essential (primary) hypertension: Secondary | ICD-10-CM

## 2016-07-18 DIAGNOSIS — E7849 Other hyperlipidemia: Secondary | ICD-10-CM

## 2016-07-18 DIAGNOSIS — E784 Other hyperlipidemia: Secondary | ICD-10-CM | POA: Diagnosis not present

## 2016-07-18 MED ORDER — VALSARTAN-HYDROCHLOROTHIAZIDE 320-25 MG PO TABS: 1.0000 | ORAL_TABLET | Freq: Every day | ORAL | 3 refills | Status: DC

## 2016-07-18 MED ORDER — AMLODIPINE BESYLATE 5 MG PO TABS: ORAL_TABLET | ORAL | 3 refills | Status: DC

## 2016-07-18 MED ORDER — ROSUVASTATIN CALCIUM 20 MG PO TABS: 20.0000 mg | ORAL_TABLET | ORAL | 3 refills | Status: DC

## 2016-07-18 NOTE — Patient Instructions (Signed)
Medication Instructions:  None  Labwork: None  Testing/Procedures: None  Follow-Up: Your physician wants you to follow-up in: 1 year with Dr. Katrinka [name].   You will receive a reminder letter in the mail two months in advance. If you don't receive a letter, please call our office to schedule the follow-up appointment.   Any Other Special Instructions Will Be Listed Below (If Applicable).  Please make sure you are exercising regularly and following a low salt diet.    Low-Sodium Eating Plan Sodium raises blood pressure and causes water to be held in the body. Getting less sodium from food will help lower your blood pressure, reduce any swelling, and protect your heart, liver, and kidneys. We get sodium by adding salt (sodium chloride) to food. Most of our sodium comes from canned, boxed, and frozen foods. Restaurant foods, fast foods, and pizza are also very high in sodium. Even if you take medicine to lower your blood pressure or to reduce fluid in your body, getting less sodium from your food is important. What is my plan? Most people should limit their sodium intake to 2,300 mg a day. Your health care provider recommends that you limit your sodium intake to __________ a day. What do I need to know about this eating plan? For the low-sodium eating plan, you will follow these general guidelines:  Choose foods with a % Daily Value for sodium of less than 5% (as listed on the food label).  Use salt-free seasonings or herbs instead of table salt or sea salt.  Check with your health care provider or pharmacist before using salt substitutes.  Eat fresh foods.  Eat more vegetables and fruits.  Limit canned vegetables. If you do use them, rinse them well to decrease the sodium.  Limit cheese to 1 oz (28 g) per day.  Eat lower-sodium products, often labeled as "lower sodium" or "no salt added."  Avoid foods that contain monosodium glutamate (MSG). MSG is sometimes added to Cong[place]inese food and  some canned foods.  Check food labels (Nutrition Facts labels) on foods to learn how much sodium is in one serving.  Eat more home-cooked food and less restaurant, buffet, and fast food.  When eating at a restaurant, ask that your food be prepared with less salt, or no salt if possible. How do I read food labels for sodium information? The Nutrition Facts label lists the amount of sodium in one serving of the food. If you eat more than one serving, you must multiply the listed amount of sodium by the number of servings. Food labels may also identify foods as:  Sodium free-Less than 5 mg in a serving.  Very low sodium-35 mg or less in a serving.  Low sodium-140 mg or less in a serving.  Light in sodium-50% less sodium in a serving. For example, if a food that usually has 300 mg of sodium is changed to become light in sodium, it will have 150 mg of sodium.  Reduced sodium-25% less sodium in a serving. For example, if a food that usually has 400 mg of sodium is changed to reduced sodium, it will have 300 mg of sodium. What foods can I eat? Grains  Low-sodium cereals, including oats, puffed wheat and rice, and shredded wheat cereals. Low-sodium crackers. Unsalted rice and pasta. Lower-sodium bread. Vegetables  Frozen or fresh vegetables. Low-sodium or reduced-sodium canned vegetables. Low-sodium or reduced-sodium tomato sauce and paste. Low-sodium or reduced-sodium tomato and vegetable juices. Fruits  Fresh, frozen, and canned fruit.  Fruit juice. Meat and Other Protein Products  Low-sodium canned tuna and salmon. Fresh or frozen meat, poultry, seafood, and fish. Lamb. Unsalted nuts. Dried beans, peas, and lentils without added salt. Unsalted canned beans. Homemade soups without salt. Eggs. Dairy  Milk. Soy milk. Ricotta cheese. Low-sodium or reduced-sodium cheeses. Yogurt. Condiments  Fresh and dried herbs and spices. Salt-free seasonings. Onion and garlic powders. Low-sodium varieties of  mustard and ketchup. Fresh or refrigerated horseradish. Lemon juice. Fats and Oils  Reduced-sodium salad dressings. Unsalted butter. Other  Unsalted popcorn and pretzels. The items listed above may not be a complete list of recommended foods or beverages. Contact your dietitian for more options.  What foods are not recommended? Grains  Instant hot cereals. Bread stuffing, pancake, and biscuit mixes. Croutons. Seasoned rice or pasta mixes. Noodle soup cups. Boxed or frozen macaroni and cheese. Self-rising flour. Regular salted crackers. Vegetables  Regular canned vegetables. Regular canned tomato sauce and paste. Regular tomato and vegetable juices. Frozen vegetables in sauces. Salted [place] fries. Olives. Rosita Fire. Relishes. Sauerkraut. Salsa. Meat and Other Protein Products  Salted, canned, smoked, spiced, or pickled meats, seafood, or fish. Bacon, ham, sausage, hot dogs, corned beef, chipped beef, and packaged luncheon meats. Salt pork. Jerky. Pickled herring. Anchovies, regular canned tuna, and sardines. Salted nuts. Dairy  Processed cheese and cheese spreads. Cheese curds. Blue cheese and cottage cheese. Buttermilk. Condiments  Onion and garlic salt, seasoned salt, table salt, and sea salt. Canned and packaged gravies. Worcestershire sauce. Tartar sauce. Barbecue sauce. Teriyaki sauce. Soy sauce, including reduced sodium. Steak sauce. Fish sauce. Oyster sauce. Cocktail sauce. Horseradish that you find on the shelf. Regular ketchup and mustard. Meat flavorings and tenderizers. Bouillon cubes. Hot sauce. Tabasco sauce. Marinades. Taco seasonings. Relishes. Fats and Oils  Regular salad dressings. Salted butter. Margarine. Ghee. Bacon fat. Other  Potato and tortilla chips. Corn chips and puffs. Salted popcorn and pretzels. Canned or dried soups. Pizza. Frozen entrees and pot pies. The items listed above may not be a complete list of foods and beverages to avoid. Contact your dietitian for more  information.  This information is not intended to replace advice given to you by your health care provider. Make sure you discuss any questions you have with your health care provider. Document Released: 11/18/2001 Document Revised: 11/04/2015 Document Reviewed: 04/02/2013 Elsevier Interactive Patient Education  2017 [employer].    If you need a refill on your cardiac medications before your next appointment, please call your pharmacy.

## 2016-07-18 NOTE — Progress Notes (Signed)
Cardiology Office Note    Date:  07/18/2016   ID:  Renee SkeensGwendolyn [name], DOB Jun 16, 1950, MRN [id number]  PCP:  Renee CopasHACKER,ROBERT KELLER, MD  Cardiologist: Lesleigh NoeHenry W Smith III, MD   Chief Complaint  Patient presents with  . Follow-up    BP    History of Present Illness:  Renee [name] is a 66 y.o. female who presents for extreme hyperlipidemia, essential hypertension, and recent history of right sixth nerve palsy.  She chose again stat in therapy despite markedly abnormal values.  Tolerating current medical regimen without problems.   Past Medical History:  Diagnosis Date  . Diabetes mellitus without complication (HCC)   . HSV infection   . Hyperlipidemia   . Hypertension     Past Surgical History:  Procedure Laterality Date  . TOE SURGERY  1996  . TUBAL LIGATION    . WISDOM TOOTH EXTRACTION      Current Medications: Outpatient Medications Prior to Visit  Medication Sig Dispense Refill  . amLODipine (NORVASC) 5 MG tablet TAKE 1 TABLET (5 MG TOTAL) BY MOUTH DAILY. 30 tablet 11  . aspirin 81 MG tablet Take 81 mg by mouth daily.    . dorzolamide-timolol (COSOPT) 22.3-6.8 MG/ML ophthalmic solution Place 1 drop into both eyes daily.     . metFORMIN (GLUCOPHAGE) 500 MG tablet Take by mouth 2 (two) times daily with a meal.    . valsartan-hydrochlorothiazide (DIOVAN-HCT) 320-25 MG tablet TAKE 1 TABLET BY MOUTH DAILY. 30 tablet 11  . diclofenac (VOLTAREN) 75 MG EC tablet Take 75 mg by mouth 2 (two) times daily with a meal.  0  . rosuvastatin (CRESTOR) 20 MG tablet Take 1 tablet (20 mg total) by mouth daily. (Patient not taking: Reported on 07/18/2016) 90 tablet 3   No facility-administered medications prior to visit.      Allergies:   Codeine   Social History   Social History  . Marital status: Divorced    Spouse name: N/A  . Number of children: 2  . Years of education: 16+   Social History Main Topics  . Smoking status: Never Smoker  . Smokeless tobacco:  Never Used  . Alcohol use 0.0 oz/week     Comment: Wine occass  . Drug use: No  . Sexual activity: No     Comment: BTL    Other Topics Concern  . None   Social History Narrative   Lives at home with herself with dog.   Caffeine use: Drink 2 cups coffee per day        Family History:  The patient's family history includes Breast cancer in her maternal aunt and sister; Cancer in her father, maternal aunt, paternal uncle, and sister; Colon cancer in her paternal uncle and paternal uncle; Diabetes in her daughter; Hyperlipidemia in her maternal grandmother and mother; Hypertension in her maternal grandmother and mother; Lung cancer in her father.   ROS:   Please see the history of present illness.    Recurring headaches.  All other systems reviewed and are negative.   PHYSICAL EXAM:   VS:  BP 132/72   Pulse 80   Ht 5\' 4"  (1.626 m)   Wt 178 lb 6.4 oz (80.9 kg)   SpO2 98%   BMI 30.62 kg/m    GEN: Well nourished, well developed, in no acute distress  HEENT: normal  Neck: no JVD, carotid bruits, or masses Cardiac: RRR; no murmurs, rubs, or gallops,no edema  Respiratory:  clear to auscultation bilaterally, normal work  of breathing GI: soft, nontender, nondistended, + BS MS: no deformity or atrophy  Skin: warm and dry, no rash Neuro:  Alert and Oriented x 3, Strength and sensation are intact Psych: euthymic mood, full affect  Wt Readings from Last 3 Encounters:  07/18/16 178 lb 6.4 oz (80.9 kg)  06/17/15 189 lb (85.7 kg)  02/17/15 185 lb (83.9 kg)      Studies/Labs Reviewed:   EKG:  EKG  Normal sinus rhythm, poor R-wave progression V1 through V6. Left axis deviation.  Recent Labs: No results found for requested labs within last 8760 hours.   Lipid Panel    Component Value Date/Time   CHOL 346 (H) 06/17/2015 1431   TRIG 89 06/17/2015 1431   HDL 71 06/17/2015 1431   CHOLHDL 4.9 06/17/2015 1431   LDLCALC 257 (H) 06/17/2015 1431    Additional studies/ records that  were reviewed today include:  None    ASSESSMENT:    1. Essential hypertension   2. Other hyperlipidemia      PLAN:  In order of problems listed above:  1. 2 g sodium diet, weight loss, aerobic activity, and continue monitoring blood pressure with target 140/90 mmHg or less. 2. We discussed the desire for primary prevention. She has not interested in therapy for lipids. She will attempt tighter diet control, exercise, and weight loss.    Medication Adjustments/Labs and Tests Ordered: Current medicines are reviewed at length with the patient today.  Concerns regarding medicines are outlined above.  Medication changes, Labs and Tests ordered today are listed in the Patient Instructions below. There are no Patient Instructions on file for this visit.   Signed, Lesleigh Noe, MD  07/18/2016 8:50 AM    [facility] [address] [place], [place], [place]  [address] Phone: [phone]; Fax: [phone]

## 2016-08-03 ENCOUNTER — Encounter: Payer: BC Managed Care – PPO | Admitting: *Deleted

## 2016-08-03 DIAGNOSIS — Z713 Dietary counseling and surveillance: Secondary | ICD-10-CM | POA: Diagnosis not present

## 2016-08-03 DIAGNOSIS — I10 Essential (primary) hypertension: Secondary | ICD-10-CM

## 2016-08-03 DIAGNOSIS — E7849 Other hyperlipidemia: Secondary | ICD-10-CM

## 2016-08-03 NOTE — Progress Notes (Signed)
Requested abbreviated appointment due to scheduling  Assessment:  Joined weight watchers to ensure that she is eating enough/eating regularly Shifted her larger meal to lunch.  Eats in the cafeteria and looks up the nutrition information ahead of time, then brings home large salad for dinner with protein. Also has snack of fruit This is working out well for her, but Still hyperglycemic in the morning  inadequate consumption at dinner/night.  Snacks mindlessly  Is trying to walk some  Intervention: eat enough.  Need 3 balanced meals and snacks each day in order to reduce binging at night  Follow up prn

## 2016-08-24 ENCOUNTER — Encounter: Payer: BC Managed Care – PPO | Attending: Family Medicine | Admitting: *Deleted

## 2016-08-24 DIAGNOSIS — E119 Type 2 diabetes mellitus without complications: Secondary | ICD-10-CM | POA: Diagnosis not present

## 2016-08-24 DIAGNOSIS — Z713 Dietary counseling and surveillance: Secondary | ICD-10-CM | POA: Insufficient documentation

## 2016-08-24 DIAGNOSIS — E7849 Other hyperlipidemia: Secondary | ICD-10-CM

## 2016-08-24 DIAGNOSIS — I10 Essential (primary) hypertension: Secondary | ICD-10-CM

## 2016-08-24 NOTE — Patient Instructions (Signed)
Dr Lubertha BasqueMaria Parades [facility] [phone]  Explore body movement you enjoy Keep working on nourishing your body.  Get enough carbs/food/fat!!  What will satisfy you

## 2016-08-24 NOTE — Progress Notes (Signed)
  Medical Nutrition Therapy:  Appt start time: 0830 end time:  0900.  Assessment:  Primary concerns today: here for follow up related to DSME, weight concerns.  When she isn't on a routine she eats more emotional and eating more treats and feels like she gained weight. She now feels more in control.  She is more structured and on a schedule Feels more satisfied and doesn't still feeling tempted to snack Is drinking more water (48 oz) Is learning more about her body and realizes what happens when she doesn't get enough during the day  MEDICATIONS: see list  DIETARY INTAKE:  24-hr recall:  B ( AM): multigrain bread with avocado and 2 slices canandian bacon or 2 eggs, 2 slices canandian bacon with veggies and 1 slice toast  Snk ( AM) :fruit  L ( PM): goes to cafeteria and eats half - black beans, brown rice, breakfast sausage Snk (PM): fruit D (PM): eats the other half of her lunch  Snk  PM): cheese or nuts if she restricted during the day Beverages: water  Recent physical activity: none    Nutritional Diagnosis:  NB-1.1 Food and nutrition-related knowledge deficit As related to how to nourish herself and manage metabolic health.  As evidenced by self-report.    Intervention:  Nutrition counseling provided.  Suggested [facility]. Discussed meeting her energy needs during the day.  Debunked diet myths.  Stressed need for adequate fuel Discussed hoe exercise can help with metabolic health Discussed progression of diabetes and challenged medication stigma    Monitoring/Evaluation:  Dietary intake, exercise, glucose, and body weight prn.

## 2016-09-12 DIAGNOSIS — H40003 Preglaucoma, unspecified, bilateral: Secondary | ICD-10-CM | POA: Insufficient documentation

## 2017-07-04 ENCOUNTER — Ambulatory Visit: Payer: BC Managed Care – PPO | Admitting: Podiatry

## 2017-07-04 ENCOUNTER — Encounter: Payer: Self-pay | Admitting: Podiatry

## 2017-07-04 ENCOUNTER — Other Ambulatory Visit: Payer: Self-pay | Admitting: Podiatry

## 2017-07-04 ENCOUNTER — Ambulatory Visit (INDEPENDENT_AMBULATORY_CARE_PROVIDER_SITE_OTHER): Payer: BC Managed Care – PPO

## 2017-07-04 VITALS — BP 114/72 | HR 74 | Resp 16

## 2017-07-04 DIAGNOSIS — M779 Enthesopathy, unspecified: Secondary | ICD-10-CM

## 2017-07-04 DIAGNOSIS — M79671 Pain in right foot: Secondary | ICD-10-CM

## 2017-07-04 DIAGNOSIS — L600 Ingrowing nail: Secondary | ICD-10-CM

## 2017-07-04 NOTE — Patient Instructions (Signed)

## 2017-07-04 NOTE — Progress Notes (Signed)
   Subjective:    Patient ID: Renee SkeensGwendolyn [name], female    DOB: April 03, 1951, 67 y.o.   MRN: [id number]  HPI    Review of Systems  All other systems reviewed and are negative.      Objective:   Physical Exam        Assessment & Plan:

## 2017-07-04 NOTE — Progress Notes (Signed)
Subjective:   Patient ID: Renee [name], female   DOB: 67 y.o.   MRN: [id number]   HPI Patient presents stating that she is had nail disease and she has an ingrown toenail of her right big toe that has to be worked on all the time with only temporary relief.  Patient states it is sore when pressed and makes shoe gear difficult.  Also she does have a complaint of her mid arch area right over left stating that it is inflamed and it hard when she walks.  Patient does not smoke and likes to be active   Review of Systems  All other systems reviewed and are negative.       Objective:  Physical Exam  Constitutional: She appears well-developed and well-nourished.  Cardiovascular: Intact distal pulses.  Pulmonary/Chest: Effort normal.  Musculoskeletal: Normal range of motion.  Neurological: She is alert.  Skin: Skin is warm.  Nursing note and vitals reviewed.   Neurovascular status intact muscle strength adequate range of motion within normal limits with patient noted to have exquisite discomfort in the medial border of the right hallux that is painful when pressed with no redness or drainage noted and just distal soreness.  Patient is noted to have discomfort around the navicular right over left with moderate depression of the arch noted     Assessment:  Chronic ingrown toenail deformity right hallux medial border with patient also noted to have probable tendinitis secondary to foot structure     Plan:  H&P condition reviewed and at this time x-ray reviewed with patient.  I did dispense fascial brace to lift the arch I discussed correction of the ingrown toenail and she wants this fixed.  I explained procedure and risk associated with surgery and she is willing to accept risk and today I infiltrated the right hallux 60 mg Xylocaine Marcaine mixture removed medial border exposed matrix and applied phenol 3 applications 30 seconds followed by alcohol lavage and sterile dressing.  Given  instructions on soaks and reappoint and also discussed long-term orthotics  X-ray indicates moderate depression of the arch with no indications of advanced arthritis or stress fracture

## 2017-07-18 ENCOUNTER — Ambulatory Visit: Payer: BC Managed Care – PPO | Admitting: Podiatry

## 2017-07-18 ENCOUNTER — Encounter: Payer: Self-pay | Admitting: Podiatry

## 2017-07-18 DIAGNOSIS — M7750 Other enthesopathy of unspecified foot: Secondary | ICD-10-CM | POA: Diagnosis not present

## 2017-07-18 DIAGNOSIS — L600 Ingrowing nail: Secondary | ICD-10-CM

## 2017-07-18 DIAGNOSIS — M779 Enthesopathy, unspecified: Secondary | ICD-10-CM

## 2017-07-18 NOTE — Progress Notes (Signed)
Subjective:   Patient ID: Lorne SkeensGwendolyn Macrae-Wade, female   DOB: 67 y.o.   MRN: [id number]   HPI Patient states the nail seems to be healing well and she would like orthotics as the brace did feel good lifting her arch up to like a pair for tennis shoes and also for dress shoes that she has to wear different shoes for work   ROS      Objective:  Physical Exam  Neurovascular status intact with patient's right nail site medial border healing well and crusted over with moderate depression of the arch with fascial brace which is been helpful     Assessment:  Tendinitis-like symptoms with moderate collapse of the arch as part of the pathological process with well-healing nail site right big toe     Plan:  Patient overall is doing well and at this point I have recommended long-term orthotics to provide for more consistent support.  Patient wants orthotics and is scanned for customized orthotics at the current time and will have a second pair made for dress shoes when the first pair is appropriate.  Reappoint to recheck when orthotics are ready by ped orthotist and scanning done today

## 2017-07-19 ENCOUNTER — Ambulatory Visit: Payer: BC Managed Care – PPO | Admitting: Orthotics

## 2017-07-19 DIAGNOSIS — M779 Enthesopathy, unspecified: Secondary | ICD-10-CM

## 2017-07-19 NOTE — Progress Notes (Signed)
Discussed f/o w patient and the value of said orthotics.  Also educated on different types

## 2017-07-30 ENCOUNTER — Other Ambulatory Visit: Payer: Self-pay | Admitting: Interventional Cardiology

## 2017-08-04 ENCOUNTER — Other Ambulatory Visit: Payer: Self-pay | Admitting: Interventional Cardiology

## 2017-08-06 ENCOUNTER — Other Ambulatory Visit: Payer: BC Managed Care – PPO | Admitting: Orthotics

## 2017-08-06 NOTE — Telephone Encounter (Signed)
Medication Detail    Disp Refills Start End    amLODipine (NORVASC) 5 MG tablet 30 tablet 0 07/30/2017    Sig: TAKE 1 TABLET (5 MG TOTAL) BY MOUTH DAILY.   Sent to pharmacy as: amLODipine (NORVASC) 5 MG tablet   Notes to Pharmacy: Please call the office at [phone] to set up yearly OV for further refills. Thank you. 1st attempt.   E-Prescribing Status: Receipt confirmed by pharmacy (07/30/2017 12:33 PM EST)    Pharmacy  [id number] - [place], [place] - [address]

## 2017-08-15 ENCOUNTER — Ambulatory Visit: Payer: BC Managed Care – PPO | Admitting: Nurse Practitioner

## 2017-08-15 ENCOUNTER — Encounter: Payer: Self-pay | Admitting: Nurse Practitioner

## 2017-08-15 ENCOUNTER — Encounter (INDEPENDENT_AMBULATORY_CARE_PROVIDER_SITE_OTHER): Payer: Self-pay

## 2017-08-15 ENCOUNTER — Ambulatory Visit
Admission: RE | Admit: 2017-08-15 | Discharge: 2017-08-15 | Disposition: A | Discharge status: Home / Self Care | Payer: Self-pay | Source: Ambulatory Visit | Attending: Nurse Practitioner | Admitting: Nurse Practitioner

## 2017-08-15 VITALS — BP 132/90 | HR 90 | Ht 64.0 in | Wt 183.8 lb

## 2017-08-15 DIAGNOSIS — I10 Essential (primary) hypertension: Secondary | ICD-10-CM

## 2017-08-15 DIAGNOSIS — E7849 Other hyperlipidemia: Secondary | ICD-10-CM

## 2017-08-15 NOTE — Progress Notes (Signed)
CARDIOLOGY OFFICE NOTE  Date:  08/15/2017    Renee [name] Date of Birth: 25-Jun-1950 Medical Record #[id number]  PCP:  Renee Screws, MD  Cardiologist:  Renee [name]   Chief Complaint  Patient presents with  . Hypertension  . Hyperlipidemia    1 [name] check - seen for Renee [name]    History of Present Illness: Renee [name] is a 67 y.o. female who presents today for a one [name] check. Seen for Renee [name].   She has a history of extreme hyperlipidemia, essential hypertension, and history of right sixth nerve palsy. Noted that she declined statin therapy in the past despite her markedly abnormal values.   Last seen in February of 2018 - felt to be doing ok.   Comes in today. Here alone. Notes she now has diabetes - says she is "still in denial about this". Taking Metformin. She has tried statin therapy - labs from January noted - LDL still pretty high. She felt bad on Crestor - says "felt like an old lady". Now on Lipitor and still feels bad. Reports that she has been advised to increase her dose to 40 of Lipitor - she is not willing to be "unfunctional". Asking for referral for lipid clinic. Apparently has had issues with cholesterol since in her 20's. Notes a pressure sensation in her chest - attributes this to indigestion. Nothing exertional. Admits she is overweight. Limited by orthopedic issues. She has had her lot of Valsartan changed out. She says she wants to lose 20#. She knows she gets too much sugar.   Past Medical History:  Diagnosis Date  . Diabetes mellitus without complication (HCC)   . HSV infection   . Hyperlipidemia   . Hypertension     Past Surgical History:  Procedure Laterality Date  . TOE SURGERY  1996  . TUBAL LIGATION    . WISDOM TOOTH EXTRACTION       Medications: Current Meds  Medication Sig  . amLODipine (NORVASC) 5 MG tablet TAKE 1 TABLET (5 MG TOTAL) BY MOUTH DAILY.  Marland Kitchen aspirin 81 MG tablet Take 81 mg by mouth daily.    Marland Kitchen atorvastatin (LIPITOR) 20 MG tablet atorvastatin 20 mg tablet daily  . dorzolamide-timolol (COSOPT) 22.3-6.8 MG/ML ophthalmic solution Place 1 drop into both eyes daily.   . metFORMIN (GLUCOPHAGE) 500 MG tablet Take by mouth 2 (two) times daily with a meal.  . valsartan-hydrochlorothiazide (DIOVAN-HCT) 320-25 MG tablet Take 1 tablet by mouth daily.     Allergies: Allergies  Allergen Reactions  . Codeine Nausea Only    Social History: The patient  reports that  has never smoked. she has never used smokeless tobacco. She reports that she drinks alcohol. She reports that she does not use drugs.   Family History: The patient's family history includes Breast cancer in her maternal aunt and sister; Cancer in her father, maternal aunt, paternal uncle, and sister; Colon cancer in her paternal uncle and paternal uncle; Diabetes in her daughter; Hyperlipidemia in her maternal grandmother and mother; Hypertension in her maternal grandmother and mother; Lung cancer in her father.   Review of Systems: Please see the history of present illness.   Otherwise, the review of systems is positive for none.   All other systems are reviewed and negative.   Physical Exam: VS:  BP 132/90 (BP Location: Left Arm, Patient Position: Sitting, Cuff Size: Normal)   Pulse 90   Ht 5\' 4"  (1.626 m)   Wt 183 lb  12.8 oz (83.4 kg)   BMI 31.55 kg/m  .  BMI Body mass index is 31.55 kg/m.  Wt Readings from Last 3 Encounters:  08/15/17 183 lb 12.8 oz (83.4 kg)  07/18/16 178 lb 6.4 oz (80.9 kg)  06/17/15 189 lb (85.7 kg)    General: Pleasant. Well developed, well nourished and in no acute distress.   HEENT: Normal.  Neck: Supple, no JVD, carotid bruits, or masses noted.  Cardiac: Regular rate and rhythm. No murmurs, rubs, or gallops. No edema.  Respiratory:  Lungs are clear to auscultation bilaterally with normal work of breathing.  GI: Soft and nontender.  MS: No deformity or atrophy. Gait and ROM intact.   Skin: Warm and dry. Color is normal.  Neuro:  Strength and sensation are intact and no gross focal deficits noted.  Psych: Alert, appropriate and with normal affect.   LABORATORY DATA:  EKG:  EKG is ordered today. This demonstrates NSR.  Lab Results  Component Value Date   GLUCOSE 120 (H) 06/17/2015   CHOL 346 (H) 06/17/2015   TRIG 89 06/17/2015   HDL 71 06/17/2015   LDLCALC 257 (H) 06/17/2015   NA 140 06/17/2015   K 3.8 06/17/2015   CL 100 06/17/2015   CREATININE 0.69 06/17/2015   BUN 13 06/17/2015   CO2 30 06/17/2015       BNP (last 3 results) No results for input(s): BNP in the last 8760 hours.  ProBNP (last 3 results) No results for input(s): PROBNP in the last 8760 hours.   Other Studies Reviewed Today:   Assessment/Plan:  1. HTN - BP ok on current regimen. Her lot has been changed on her Valsartan.   2. HLD - she does not like taking statin due to the way it makes her feel. She is asking for referral to the lipid clinic. She is also interested in having calcium scoring.   3. Obesity - discussed at length - my tips given today. She knows her downfalls and how to improve.    Current medicines are reviewed with the patient today.  The patient does not have concerns regarding medicines other than what has been noted above.  The following changes have been made:  See above.  Labs/ tests ordered today include:    Orders Placed This Encounter  Procedures  . CT CARDIAC SCORING  . EKG 12-Lead     Disposition:   FU with Dr. Katrinka [name] in one [name].    Patient is agreeable to this plan and will call if any problems develop in the interim.   SignedNorma [name]: Renee Demary, NP  08/15/2017 11:15 AM  [facility] [address] [place], [place]  [address] Phone: [phone] Fax: [phone]

## 2017-08-15 NOTE — Patient Instructions (Addendum)
We will be checking the following labs today - NONE   Medication Instructions:    Continue with your current medicines.     Testing/Procedures To Be Arranged:  Calcium scoring study please  Follow-Up:   See Dr. Katrinka BlazingSmith in one year    Other Special Instructions:   Think about what we talked about today    If you need a refill on your cardiac medications before your next appointment, please call your pharmacy.   Call the [facility] office at [phone] if you have any questions, problems or concerns.

## 2017-08-20 ENCOUNTER — Other Ambulatory Visit: Payer: Self-pay | Admitting: Nurse Practitioner

## 2017-08-20 DIAGNOSIS — I7789 Other specified disorders of arteries and arterioles: Secondary | ICD-10-CM

## 2017-08-20 DIAGNOSIS — R931 Abnormal findings on diagnostic imaging of heart and coronary circulation: Secondary | ICD-10-CM

## 2017-08-20 DIAGNOSIS — R079 Chest pain, unspecified: Secondary | ICD-10-CM

## 2017-08-21 ENCOUNTER — Encounter: Payer: Self-pay | Admitting: *Deleted

## 2017-08-21 ENCOUNTER — Encounter (INDEPENDENT_AMBULATORY_CARE_PROVIDER_SITE_OTHER): Payer: Self-pay

## 2017-08-21 ENCOUNTER — Telehealth: Payer: Self-pay | Admitting: *Deleted

## 2017-08-21 ENCOUNTER — Other Ambulatory Visit: Payer: Self-pay | Admitting: *Deleted

## 2017-08-21 MED ORDER — METOPROLOL TARTRATE 50 MG PO TABS: 25.0000 mg | ORAL_TABLET | Freq: Once | ORAL | 0 refills | Status: DC

## 2017-08-21 NOTE — Telephone Encounter (Signed)
lvm for pt, Renee [name] s/w Dr. Katrinka BlazingSmith, per pt about upcoming procedure and statins.  Dr. Katrinka BlazingSmith stated  He agreed with CTA, agreed with statin recommendation/high intensity/goal of LDL at 70. Stated to many risk factors.

## 2017-08-22 NOTE — Telephone Encounter (Signed)
lvm per (DPR) with Dr.Smith's recommendation's.  Will call back if pt wants to schedule lipid clinic appt.

## 2017-08-22 NOTE — Telephone Encounter (Signed)
Follow UP:     Returning Renee [name]'s call from yesterday.She will not be available today from 1:00 to 2:00.

## 2017-08-24 ENCOUNTER — Other Ambulatory Visit: Payer: BC Managed Care – PPO | Admitting: Orthotics

## 2017-08-30 ENCOUNTER — Ambulatory Visit (INDEPENDENT_AMBULATORY_CARE_PROVIDER_SITE_OTHER): Payer: BC Managed Care – PPO | Admitting: Pharmacist

## 2017-08-30 DIAGNOSIS — E7849 Other hyperlipidemia: Secondary | ICD-10-CM

## 2017-08-30 NOTE — Progress Notes (Signed)
Patient ID: Renee [name]                 DOB: January 05, 1951                    MRN: [id number]     HPI: Renee [name] is a 67 y.o. female patient of Dr. Katrinka BlazingSmith that presents today for lipid evaluation.  PMH includes extreme hyperlipidemia, essential hypertension, and history of right sixth nerve palsy. Noted that she declined statin therapy in the past despite her markedly abnormal values. Calcium Score of 85, 78% for age and sex matched control. Her ASCVD 10 year risk is 18.4% indicating aggressive modification is warranted.   She presents today for discussion of PCSK9i. She reports that she has had horrible side effects with statin medications and she absolutely will not retry statin medications.   Risk Factors: DM, HTN, calcium score 85, 78% for age and sex LDL Goal: <70  Current Medications: none Intolerances: atorvastatin 20mg  daily (cramps in legs even when sitting so bad that can not move), rosuvastatin 20mg  daily (felt like an "old lady" and she would rather die than take this medication) - these symptoms improved with discontinuation of therapy  Diet: Very mindful about what she eats, she eats DM mindful. She eats mostly chicken and fish. Ribs and beef on occasion. Does eat green leafy vegetables. She juices a lot. She avoid fried foods. She prepares most food baked or sauteed. She does not drink juice or soda. She drinks coffee 4 cups per day with half and half.   Exercise: Have not really been exercising in last 8 months. Does plan on starting exercising next week.   Family History: Breast cancer in her maternal aunt and sister; Cancer in her father, maternal aunt, paternal uncle, and sister; Colon cancer in her paternal uncle and paternal uncle; Diabetes in her daughter; Hyperlipidemia in her maternal grandmother and mother; Hypertension in her maternal grandmother and mother; Lung cancer in her father.  Social History: The patient  reports that  has never smoked. she  has never used smokeless tobacco. She reports that she drinks alcohol. She reports that she does not use drugs.  Labs: 07/11/17: TC 229, TG 75, HDL 53, LDL 161, nonHDL 176 (no therapy) 06/17/15: LDL particle number 2597, TC 346, HDL 71, TG 89, LDL 257, nonHDL 275, ApoB 409182, Lpa 104   Past Medical History:  Diagnosis Date  . Diabetes mellitus without complication (HCC)   . HSV infection   . Hyperlipidemia   . Hypertension     Current Outpatient Medications on File Prior to Visit  Medication Sig Dispense Refill  . amLODipine (NORVASC) 5 MG tablet TAKE 1 TABLET (5 MG TOTAL) BY MOUTH DAILY. 30 tablet 0  . aspirin 81 MG tablet Take 81 mg by mouth daily.    Marland Kitchen. atorvastatin (LIPITOR) 20 MG tablet atorvastatin 20 mg tablet daily    . dorzolamide-timolol (COSOPT) 22.3-6.8 MG/ML ophthalmic solution Place 1 drop into both eyes daily.     . metFORMIN (GLUCOPHAGE) 500 MG tablet Take by mouth 2 (two) times daily with a meal.    . metoprolol tartrate (LOPRESSOR) 50 MG tablet Take 0.5 tablets (25 mg total) by mouth once for 1 dose. 25 mg pm the night before test/25 mg one hour before test 2 tablet 0  . valsartan-hydrochlorothiazide (DIOVAN-HCT) 320-25 MG tablet Take 1 tablet by mouth daily. 90 tablet 3   No current facility-administered medications on file prior to visit.  Allergies  Allergen Reactions  . Codeine Nausea Only    Assessment/Plan: Hyperlipidemia: LDL is not at goal. Discussed options including retrial of statin, Zetia therapy, and PCSK9i therapy. Pt is unwilling to do retrial on statin medication. There is no evidence for Zetia monotherapy and this would be unlikely to bring LDL to goal. Will pursue PCSK9i therapy with Repatha.    Thank you,  Freddie Apley. Cleatis Polka, PharmD  [facility]  08/30/2017 8:06 AM

## 2017-08-30 NOTE — Patient Instructions (Addendum)
We will send for coverage of Repatha 140mg  every 14 day injection. We will call once we have an approval.   [phone].   Cholesterol Cholesterol is a fat. Your body needs a small amount of cholesterol. Cholesterol (plaque) may build up in your blood vessels (arteries). That makes you more likely to have a heart attack or stroke. You cannot feel your cholesterol level. Having a blood test is the only way to find out if your level is high. Keep your test results. Work with your doctor to keep your cholesterol at a good level. What do the results mean?  Total cholesterol is how much cholesterol is in your blood.  LDL is bad cholesterol. This is the type that can build up. Try to have low LDL.  HDL is good cholesterol. It cleans your blood vessels and carries LDL away. Try to have high HDL.  Triglycerides are fat that the body can store or burn for energy. What are good levels of cholesterol?  Total cholesterol below 200.  LDL below 100 is good for people who have health risks. LDL below 70 is good for people who have very high risks.  HDL above 40 is good. It is best to have HDL of 60 or higher.  Triglycerides below 150. How can I lower my cholesterol? Diet Follow your diet program as told by your doctor.  Choose fish, white meat chicken, or [place] that is roasted or baked. Try not to eat red meat, fried foods, sausage, or lunch meats.  Eat lots of fresh fruits and vegetables.  Choose whole grains, beans, pasta, potatoes, and cereals.  Choose olive oil, corn oil, or canola oil. Only use small amounts.  Try not to eat butter, mayonnaise, shortening, or palm kernel oils.  Try not to eat foods with trans fats.  Choose low-fat or nonfat dairy foods. ? Drink skim or nonfat milk. ? Eat low-fat or nonfat yogurt and cheeses. ? Try not to drink whole milk or cream. ? Try not to eat ice cream, egg yolks, or full-fat cheeses.  Healthy desserts include angel food cake, ginger  snaps, animal crackers, hard candy, popsicles, and low-fat or nonfat frozen yogurt. Try not to eat pastries, cakes, pies, and cookies.  Exercise Follow your exercise program as told by your doctor.  Be more active. Try gardening, walking, and taking the stairs.  Ask your doctor about ways that you can be more active.  Medicine  Take over-the-counter and prescription medicines only as told by your doctor. This information is not intended to replace advice given to you by your health care provider. Make sure you discuss any questions you have with your health care provider. Document Released: 08/25/2008 Document Revised: 12/29/2015 Document Reviewed: 12/09/2015 Elsevier Interactive Patient Education  [employer]2018 Elsevier Inc.

## 2017-08-31 ENCOUNTER — Other Ambulatory Visit: Payer: Self-pay | Admitting: Interventional Cardiology

## 2017-09-04 ENCOUNTER — Encounter: Payer: Self-pay | Admitting: Pharmacist

## 2017-09-06 ENCOUNTER — Ambulatory Visit: Payer: BC Managed Care – PPO | Admitting: Orthotics

## 2017-09-06 DIAGNOSIS — M779 Enthesopathy, unspecified: Secondary | ICD-10-CM

## 2017-09-06 DIAGNOSIS — M79671 Pain in right foot: Secondary | ICD-10-CM

## 2017-09-06 NOTE — Progress Notes (Signed)
Patient was delivered f/o today.  Patient overall enjoyed the fit and function of the f/o; however she was concerned about feeling she was "rolling out" laterally.  I adjusted by added 2 degree valgus wedge right and 4* left.   I told her to make a two week f/up appointment for me to glue everything down

## 2017-09-14 ENCOUNTER — Telehealth: Payer: Self-pay | Admitting: Pharmacist

## 2017-09-14 MED ORDER — EVOLOCUMAB 140 MG/ML ~~LOC~~ SOAJ: 140.0000 mg | SUBCUTANEOUS | 3 refills | Status: DC

## 2017-09-14 NOTE — Telephone Encounter (Signed)
Auth approved through insurance for Repatha. Will send to pharmacy. Pt aware to call if cost prohibitive. She would like to come to office to give injection and she will call to set this up once she has medication.

## 2017-09-19 ENCOUNTER — Telehealth: Payer: Self-pay | Admitting: Nurse Practitioner

## 2017-09-19 NOTE — Telephone Encounter (Signed)
Error no note needed/jmp °

## 2017-09-20 ENCOUNTER — Ambulatory Visit: Payer: BC Managed Care – PPO | Admitting: Orthotics

## 2017-09-20 DIAGNOSIS — M779 Enthesopathy, unspecified: Secondary | ICD-10-CM

## 2017-09-20 NOTE — Progress Notes (Signed)
Redoing f/o w/ lower arch, shallow heel cup, p-cell cover. Valgus RF 2* post.

## 2017-09-21 ENCOUNTER — Other Ambulatory Visit: Payer: Self-pay | Admitting: *Deleted

## 2017-09-21 DIAGNOSIS — E7849 Other hyperlipidemia: Secondary | ICD-10-CM

## 2017-09-21 DIAGNOSIS — I10 Essential (primary) hypertension: Secondary | ICD-10-CM

## 2017-10-01 ENCOUNTER — Other Ambulatory Visit: Payer: BC Managed Care – PPO | Admitting: *Deleted

## 2017-10-01 DIAGNOSIS — I10 Essential (primary) hypertension: Secondary | ICD-10-CM

## 2017-10-01 DIAGNOSIS — E7849 Other hyperlipidemia: Secondary | ICD-10-CM

## 2017-10-01 LAB — HEPATIC FUNCTION PANEL
ALK PHOS: 65 IU/L (ref 39–117)
ALT: 11 IU/L (ref 0–32)
AST: 11 IU/L (ref 0–40)
Albumin: 4 g/dL (ref 3.6–4.8)
Bilirubin Total: 0.4 mg/dL (ref 0.0–1.2)
Bilirubin, Direct: 0.09 mg/dL (ref 0.00–0.40)
TOTAL PROTEIN: 6.7 g/dL (ref 6.0–8.5)

## 2017-10-01 LAB — BASIC METABOLIC PANEL
BUN / CREAT RATIO: 16 (ref 12–28)
BUN: 14 mg/dL (ref 8–27)
CO2: 24 mmol/L (ref 20–29)
Calcium: 9.4 mg/dL (ref 8.7–10.3)
Chloride: 99 mmol/L (ref 96–106)
Creatinine, Ser: 0.9 mg/dL (ref 0.57–1.00)
GFR calc Af Amer: 77 mL/min/{1.73_m2} (ref >59)
GFR, EST NON AFRICAN AMERICAN: 67 mL/min/{1.73_m2} (ref >59)
Glucose: 147 mg/dL — ABNORMAL HIGH (ref 65–99)
POTASSIUM: 4.2 mmol/L (ref 3.5–5.2)
SODIUM: 138 mmol/L (ref 134–144)

## 2017-10-01 LAB — CBC
Hematocrit: 37.9 % (ref 34.0–46.6)
Hemoglobin: 13.1 g/dL (ref 11.1–15.9)
MCH: 30.8 pg (ref 26.6–33.0)
MCHC: 34.6 g/dL (ref 31.5–35.7)
MCV: 89 fL (ref 79–97)
PLATELETS: 236 10*3/uL (ref 150–379)
RBC: 4.25 x10E6/uL (ref 3.77–5.28)
RDW: 12.9 % (ref 12.3–15.4)
WBC: 5.1 10*3/uL (ref 3.4–10.8)

## 2017-10-02 ENCOUNTER — Telehealth: Payer: Self-pay | Admitting: Pharmacist

## 2017-10-02 NOTE — Telephone Encounter (Signed)
Spoke with patient who states that she was told that [facility] to our office. I advised that we do not accept shipments for patient medication and that she would need [facility]. She states that is fine, but that she has not started the medication because she was waiting for me to call that we had received her shipment. She was notified that [facility] office. As far as I am aware we have not received any patient specific medication. Advised I would call [facility] to ensure that they did not [facility] to our office and make sure that patient will be able to obtain from them.

## 2017-10-02 NOTE — Telephone Encounter (Signed)
Spoke with [facility] medication to our office and was received today. They are unsure if anyone was ever called to verify ok to ship to our practice. Had [facility] [employer] remove our practice from patient record as shipping option and advised that we DO NOT accept patient medications. [facility] [employer] made note of this and has removed our address. The patient's next dose will be shipped to her local pharmacy for pick up by her.   Spoke with patient and made aware of above. She will come to office on Thursday 4/25 to do first injection and we schedule labs at that time.

## 2017-10-02 NOTE — Telephone Encounter (Signed)
[facility] who states that pt can pick up rx from the store if she tells [facility] that she would like to have sent to local pharmacy. [phone] - [facility].   LMOM to call and set up shipment to pharmacy and call our office once obtained.

## 2017-10-03 LAB — LIPID PANEL
CHOLESTEROL TOTAL: 284 mg/dL — AB (ref 100–199)
Chol/HDL Ratio: 4.9 ratio — ABNORMAL HIGH (ref 0.0–4.4)
HDL: 58 mg/dL (ref >39)
LDL Calculated: 213 mg/dL — ABNORMAL HIGH (ref 0–99)
TRIGLYCERIDES: 65 mg/dL (ref 0–149)
VLDL Cholesterol Cal: 13 mg/dL (ref 5–40)

## 2017-10-03 LAB — SPECIMEN STATUS REPORT

## 2017-10-04 ENCOUNTER — Telehealth: Payer: Self-pay | Admitting: Pharmacist

## 2017-10-04 DIAGNOSIS — E785 Hyperlipidemia, unspecified: Secondary | ICD-10-CM

## 2017-10-04 NOTE — Telephone Encounter (Signed)
Pt in office today for first Repatha injection. Demonstrates appropriate technique. Labs scheduled for June 12th to evaluate efficacy.

## 2017-10-12 ENCOUNTER — Ambulatory Visit (HOSPITAL_COMMUNITY)
Admission: RE | Admit: 2017-10-12 | Discharge: 2017-10-12 | Disposition: A | Discharge status: Home / Self Care | Payer: BC Managed Care – PPO | Source: Ambulatory Visit | Attending: Nurse Practitioner | Admitting: Nurse Practitioner

## 2017-10-12 DIAGNOSIS — R079 Chest pain, unspecified: Secondary | ICD-10-CM | POA: Diagnosis not present

## 2017-10-12 DIAGNOSIS — I7789 Other specified disorders of arteries and arterioles: Secondary | ICD-10-CM | POA: Insufficient documentation

## 2017-10-12 DIAGNOSIS — R931 Abnormal findings on diagnostic imaging of heart and coronary circulation: Secondary | ICD-10-CM | POA: Diagnosis present

## 2017-10-12 DIAGNOSIS — R0789 Other chest pain: Secondary | ICD-10-CM | POA: Diagnosis not present

## 2017-10-12 LAB — POCT I-STAT CREATININE: Creatinine, Ser: 0.8 mg/dL (ref 0.44–1.00)

## 2017-10-12 MED ORDER — METOPROLOL TARTRATE 5 MG/5ML IV SOLN
INTRAVENOUS | Status: AC
  Filled 2017-10-12: qty 10

## 2017-10-12 MED ORDER — NITROGLYCERIN 0.4 MG SL SUBL
0.8000 mg | SUBLINGUAL_TABLET | Freq: Once | SUBLINGUAL | Status: AC
  Administered 2017-10-12: 0.8 mg via SUBLINGUAL
  Filled 2017-10-12: qty 25

## 2017-10-12 MED ORDER — IOPAMIDOL (ISOVUE-370) INJECTION 76%
100.0000 mL | Freq: Once | INTRAVENOUS | Status: AC | PRN
  Administered 2017-10-12: 80 mL via INTRAVENOUS

## 2017-10-12 MED ORDER — METOPROLOL TARTRATE 5 MG/5ML IV SOLN
5.0000 mg | INTRAVENOUS | Status: DC | PRN
  Administered 2017-10-12: 5 mg via INTRAVENOUS
  Filled 2017-10-12: qty 5

## 2017-10-12 MED ORDER — IOPAMIDOL (ISOVUE-370) INJECTION 76%
INTRAVENOUS | Status: AC
  Filled 2017-10-12: qty 100

## 2017-10-12 MED ORDER — NITROGLYCERIN 0.4 MG SL SUBL
SUBLINGUAL_TABLET | SUBLINGUAL | Status: AC
  Filled 2017-10-12: qty 2

## 2017-10-12 NOTE — Progress Notes (Signed)
CT complete. Patient given coffee per request. Denies any complaints.

## 2017-10-12 NOTE — Progress Notes (Signed)
Patient ambulatory out of department alone with steady gait noted.

## 2017-11-01 ENCOUNTER — Telehealth: Payer: Self-pay | Admitting: Pharmacist

## 2017-11-01 NOTE — Telephone Encounter (Signed)
Received fax from [employer] that pt had intolerable side effects and has stopped the medication. We had no knowledge of this thus called pt.   Pt reports that she has some bruising on her leg. She states that if she had any other side effects they must have resolved as she feels fine now. She is due to take another injection today and plans to do so. She never had any intention of stopping the medication and is unsure why the drug company would say so. She states that the pharmacy told her there was a hold on the medication. She will call the pharmacy and advise that she will fill. She will call back if she has any difficulty obtaining medication. Also adjusted labs so that will have results when she sees Dr. Katrinka Blazing in June.

## 2017-11-13 ENCOUNTER — Other Ambulatory Visit: Payer: BC Managed Care – PPO | Admitting: *Deleted

## 2017-11-13 DIAGNOSIS — E785 Hyperlipidemia, unspecified: Secondary | ICD-10-CM

## 2017-11-13 LAB — LIPID PANEL
CHOL/HDL RATIO: 3 ratio (ref 0.0–4.4)
Cholesterol, Total: 193 mg/dL (ref 100–199)
HDL: 64 mg/dL (ref >39)
LDL Calculated: 116 mg/dL — ABNORMAL HIGH (ref 0–99)
TRIGLYCERIDES: 66 mg/dL (ref 0–149)
VLDL Cholesterol Cal: 13 mg/dL (ref 5–40)

## 2017-11-13 LAB — HEPATIC FUNCTION PANEL
ALK PHOS: 58 IU/L (ref 39–117)
ALT: 17 IU/L (ref 0–32)
AST: 15 IU/L (ref 0–40)
Albumin: 4 g/dL (ref 3.6–4.8)
Bilirubin Total: 0.3 mg/dL (ref 0.0–1.2)
Bilirubin, Direct: 0.06 mg/dL (ref 0.00–0.40)
TOTAL PROTEIN: 6.3 g/dL (ref 6.0–8.5)

## 2017-11-13 NOTE — Progress Notes (Signed)
Cardiology Office Note    Date:  11/14/2017   ID:  Renee [name], DOB 05-25-1951, MRN [id number]  PCP:  [name], MD  Cardiologist: Lesleigh Noe, MD   Chief Complaint  Patient presents with  . Coronary Artery Disease  . Hypertension    History of Present Illness:  Renee [name] is a 67 y.o. female who presents for probable familial hyperlipidemia, low risk coronary calcium score 115 (85th percentile), essential hypertension, and recent history of right sixth nerve palsy.  Has been seen in lipid clinic.  Currently on Repatha for lipid elevation.  Patient is on Repatha.  No significant side effects.  Refers not to use statins because of prior side effects.  She is exercising.  She is taking antihypertensive therapy without concerns.  Also being treated for diabetes and most recent hemoglobin A1c was 6.3.  Pre-Repatha, LDL was 213 and post LDL has dropped to 116.  HDL cholesterol is 64 liver tests are normal.   Past Medical History:  Diagnosis Date  . Diabetes mellitus without complication (HCC)   . HSV infection   . Hyperlipidemia   . Hypertension     Past Surgical History:  Procedure Laterality Date  . TOE SURGERY  1996  . TUBAL LIGATION    . WISDOM TOOTH EXTRACTION      Current Medications: Outpatient Medications Prior to Visit  Medication Sig Dispense Refill  . amLODipine (NORVASC) 5 MG tablet TAKE 1 TABLET BY MOUTH EVERY DAY 90 tablet 3  . aspirin 81 MG tablet Take 81 mg by mouth daily.    . Evolocumab (REPATHA SURECLICK) 140 MG/ML SOAJ Inject 140 mg into the skin every 14 (fourteen) days. 6 pen 3  . LUMIGAN 0.01 % SOLN Place 1 drop into both eyes every evening.    . metFORMIN (GLUCOPHAGE) 500 MG tablet Take 1,000 mg by mouth daily with breakfast.     . valsartan-hydrochlorothiazide (DIOVAN-HCT) 320-25 MG tablet Take 1 tablet by mouth daily. 90 tablet 3  . dorzolamide-timolol (COSOPT) 22.3-6.8 MG/ML ophthalmic solution Place 1 drop into  both eyes daily.     . metoprolol tartrate (LOPRESSOR) 50 MG tablet Take 0.5 tablets (25 mg total) by mouth once for 1 dose. 25 mg pm the night before test/25 mg one hour before test 2 tablet 0   No facility-administered medications prior to visit.      Allergies:   Codeine   Social History   Socioeconomic History  . Marital status: Divorced    Spouse name: Not on file  . Number of children: 2  . Years of education: 16+  . Highest education level: Not on file  Occupational History  . Not on file  Social Needs  . Financial resource strain: Not on file  . Food insecurity:    Worry: Not on file    Inability: Not on file  . Transportation needs:    Medical: Not on file    Non-medical: Not on file  Tobacco Use  . Smoking status: Never Smoker  . Smokeless tobacco: Never Used  Substance and Sexual Activity  . Alcohol use: Yes    Alcohol/week: 0.0 oz    Comment: Wine occass  . Drug use: No  . Sexual activity: Never    Comment: BTL   Lifestyle  . Physical activity:    Days per week: Not on file    Minutes per session: Not on file  . Stress: Not on file  Relationships  . Social connections:  Talks on phone: Not on file    Gets together: Not on file    Attends religious service: Not on file    Active member of club or organization: Not on file    Attends meetings of clubs or organizations: Not on file    Relationship status: Not on file  Other Topics Concern  . Not on file  Social History Narrative   Lives at home with herself with dog.   Caffeine use: Drink 2 cups coffee per day     Family History:  The patient's family history includes Breast cancer in her maternal aunt and sister; Cancer in her father, maternal aunt, paternal uncle, and sister; Colon cancer in her paternal uncle and paternal uncle; Diabetes in her daughter; Hyperlipidemia in her maternal grandmother and mother; Hypertension in her maternal grandmother and mother; Lung cancer in her father.   ROS:     Please see the history of present illness.    No side effects on current medical regimen All other systems reviewed and are negative.   PHYSICAL EXAM:   VS:  BP 122/80   Pulse 80   Ht 5\' 4"  (1.626 m)   Wt 183 lb 9.6 oz (83.3 kg)   BMI 31.51 kg/m    GEN: Well nourished, well developed, in no acute distress  HEENT: normal  Neck: no JVD, carotid bruits, or masses Cardiac: RRR; no murmurs, rubs, or gallops,no edema  Respiratory:  clear to auscultation bilaterally, normal work of breathing GI: soft, nontender, nondistended, + BS MS: no deformity or atrophy  Skin: warm and dry, no rash Neuro:  Alert and Oriented x 3, Strength and sensation are intact Psych: euthymic mood, full affect  Wt Readings from Last 3 Encounters:  11/14/17 183 lb 9.6 oz (83.3 kg)  08/15/17 183 lb 12.8 oz (83.4 kg)  07/18/16 178 lb 6.4 oz (80.9 kg)      Studies/Labs Reviewed:   EKG:  EKG not repeated  Recent Labs: 10/01/2017: BUN 14; Hemoglobin 13.1; Platelets 236; Potassium 4.2; Sodium 138 10/12/2017: Creatinine, Ser 0.80 11/13/2017: ALT 17   Lipid Panel    Component Value Date/Time   CHOL 193 11/13/2017 0803   CHOL 346 (H) 06/17/2015 1431   TRIG 66 11/13/2017 0803   TRIG 89 06/17/2015 1431   HDL 64 11/13/2017 0803   HDL 71 06/17/2015 1431   CHOLHDL 3.0 11/13/2017 0803   CHOLHDL 4.9 06/17/2015 1431   LDLCALC 116 (H) 11/13/2017 0803   LDLCALC 257 (H) 06/17/2015 1431    Additional studies/ records that were reviewed today include:  Coronary calcium and coronary morphology by CT May 2019:  IMPRESSION: 1. Coronary calcium score of 107. This was 66 percentile for age and sex matched control.  2. Normal coronary origin with left dominance.  3. Mild CAD in the proximal LAD and LCX arteries. Aggressive risk factor modification is recommended.  4. Mildly dilated pulmonary artery measuring 33 mm.  5. Normal size of the thoracic aorta. There is no evidence for ascending aortic  aneurysm.    ASSESSMENT:    1. Coronary artery calcification seen on CAT scan   2. Essential hypertension   3. Other hyperlipidemia   4. Controlled type 2 diabetes mellitus with other circulatory complication, without long-term current use of insulin (HCC)      PLAN:  In order of problems listed above:  1. Low to intermediate risk coronary calcium score without evidence of obstructive disease on morphology.  Calcium score is 107 which  is 85th percentile for age.  Less than 20% plaquing is noted to mid LAD.  Plan is more aggressive risk modification.  Patient unable to tolerate statins.  Repatha has been started with a dramatic drop DL from 161213 to 096115.  Encouraged diet, aerobic activity, diabetes controlled hemoglobin A1c less than 7, and close clinical follow-up.   2. Target blood pressure 130/80 MmHg. 3. Target LDL less than 70.  May consider adding Zetia.  Discussed the concept of ultra low-dose statin therapy.  She will follow-up in the lipid clinic. 4. Target A1c less than 7 point 0.  Exercise also discussed as well as diet including marked reduction in carbohydrate intake.   Clinical follow-up in 1 year.  Encouraged altering her risk and encouraged to be aggressive in attempts to control.   Medication Adjustments/Labs and Tests Ordered: Current medicines are reviewed at length with the patient today.  Concerns regarding medicines are outlined above.  Medication changes, Labs and Tests ordered today are listed in the Patient Instructions below. Patient Instructions  Medication Instructions:  Your physician recommends that you continue on your current medications as directed. Please refer to the Current Medication list given to you today.  Labwork: None  Testing/Procedures: None  Follow-Up: Your physician wants you to follow-up in: 1 year with Dr. Katrinka BlazingSmith.  You will receive a reminder letter in the mail two months in advance. If you don't receive a letter, please call our  office to schedule the follow-up appointment.   Any Other Special Instructions Will Be Listed Below (If Applicable).     If you need a refill on your cardiac medications before your next appointment, please call your pharmacy.      Signed, Lesleigh NoeHenry W Ragen Laver III, MD  11/14/2017 6:02 PM    [facility] [address], [place], [place]  [address] Phone: [phone]; Fax: [phone]

## 2017-11-14 ENCOUNTER — Encounter

## 2017-11-14 ENCOUNTER — Ambulatory Visit: Payer: BC Managed Care – PPO | Admitting: Interventional Cardiology

## 2017-11-14 ENCOUNTER — Encounter: Payer: Self-pay | Admitting: Interventional Cardiology

## 2017-11-14 VITALS — BP 122/80 | HR 80 | Ht 64.0 in | Wt 183.6 lb

## 2017-11-14 DIAGNOSIS — E1159 Type 2 diabetes mellitus with other circulatory complications: Secondary | ICD-10-CM | POA: Diagnosis not present

## 2017-11-14 DIAGNOSIS — E7849 Other hyperlipidemia: Secondary | ICD-10-CM | POA: Diagnosis not present

## 2017-11-14 DIAGNOSIS — I10 Essential (primary) hypertension: Secondary | ICD-10-CM

## 2017-11-14 DIAGNOSIS — I2510 Atherosclerotic heart disease of native coronary artery without angina pectoris: Secondary | ICD-10-CM

## 2017-11-14 NOTE — Patient Instructions (Signed)

## 2017-11-21 ENCOUNTER — Other Ambulatory Visit: Payer: BC Managed Care – PPO

## 2017-11-25 ENCOUNTER — Other Ambulatory Visit: Payer: Self-pay | Admitting: Interventional Cardiology

## 2018-01-07 ENCOUNTER — Ambulatory Visit: Payer: BC Managed Care – PPO | Admitting: Orthotics

## 2018-01-07 DIAGNOSIS — M79671 Pain in right foot: Secondary | ICD-10-CM

## 2018-01-07 DIAGNOSIS — M779 Enthesopathy, unspecified: Secondary | ICD-10-CM

## 2018-01-07 NOTE — Progress Notes (Signed)
Patient came in today to pick up custom made foot orthotics.  The goals were accomplished and the patient reported no initial dissatisfaction with said orthotics. However, she did noticed that on the right f/o the arch seemed a little proximal;  I told her after breakin period we could adjust that.  Patient was advised of breakin period and how to report any issues.

## 2018-05-13 ENCOUNTER — Other Ambulatory Visit: Payer: Self-pay | Admitting: Obstetrics and Gynecology

## 2018-05-13 DIAGNOSIS — Z1231 Encounter for screening mammogram for malignant neoplasm of breast: Secondary | ICD-10-CM

## 2018-05-22 ENCOUNTER — Telehealth: Payer: Self-pay | Admitting: Interventional Cardiology

## 2018-05-22 MED ORDER — EVOLOCUMAB 140 MG/ML ~~LOC~~ SOAJ: 140.0000 mg | SUBCUTANEOUS | 3 refills | Status: DC

## 2018-05-22 NOTE — Telephone Encounter (Signed)
RX refill sent as requested.  

## 2018-05-22 NOTE — Telephone Encounter (Signed)
°*  STAT* If patient is at the pharmacy, call can be transferred to refill team.   1. Which medications need to be refilled? (please list name of each medication and dose if known)  New prescription for Repatha- changing pharmacy  2. Which pharmacy/location (including street and city if local pharmacy) is [phone]  3. Do they need a 30 day or 90 day supply? Injection

## 2018-06-18 ENCOUNTER — Ambulatory Visit
Admission: RE | Admit: 2018-06-18 | Discharge: 2018-06-18 | Disposition: A | Discharge status: Home / Self Care | Payer: BC Managed Care – PPO | Source: Ambulatory Visit | Attending: Obstetrics and Gynecology | Admitting: Obstetrics and Gynecology

## 2018-06-18 DIAGNOSIS — Z1231 Encounter for screening mammogram for malignant neoplasm of breast: Secondary | ICD-10-CM

## 2018-06-27 ENCOUNTER — Telehealth: Payer: Self-pay | Admitting: Neurology

## 2018-06-27 NOTE — Telephone Encounter (Signed)
[email or web address] is asking for a call from RN of Dr Lucia Gaskins re: timeframe pt was last seen here in 2016, please call

## 2018-06-28 NOTE — Telephone Encounter (Signed)
Pt needs to sign a release form. Pt will stop by today to sign release.

## 2018-08-05 ENCOUNTER — Other Ambulatory Visit: Payer: Self-pay | Admitting: Interventional Cardiology

## 2018-08-05 ENCOUNTER — Telehealth: Payer: Self-pay | Admitting: Pharmacist

## 2018-08-05 DIAGNOSIS — E7849 Other hyperlipidemia: Secondary | ICD-10-CM

## 2018-08-05 MED ORDER — PITAVASTATIN CALCIUM 1 MG PO TABS: 1.0000 mg | ORAL_TABLET | Freq: Every day | ORAL | 11 refills | Status: DC

## 2018-08-05 NOTE — Telephone Encounter (Signed)
Spoke with patient about cholesterol panel and need to get LDL lower. Discussed low dose statin vs. bempedoic acid. Pt is very leary of statin, but would prefer a medication that has been available for a while.   She would like to try Livalo 1mg  daily if it is cost-effective; otherwise would like to try pravastatin 10mg  daily. She will call back to let us know if Livalo will be cost-prohibitive. Will need to schedule labs for 8-12 weeks post initiation (orders entered - just need to schedule).

## 2018-08-05 NOTE — Telephone Encounter (Signed)
Pt left voicemail - states she spoke with pharmacy who mentioned a denial and appeal but did not have specifics. Submitted prior authorization for Livalo.

## 2018-08-06 MED ORDER — PRAVASTATIN SODIUM 20 MG PO TABS: 10.0000 mg | ORAL_TABLET | Freq: Every evening | ORAL | 3 refills | Status: DC

## 2018-08-06 NOTE — Telephone Encounter (Signed)
Spoke with patient and she is willing to try pravastatin. Scheduled for labs on 10/02/18.

## 2018-08-06 NOTE — Addendum Note (Signed)
Addended by: Levin Bacon on: 08/06/2018 11:22 PM   Modules accepted: Orders

## 2018-08-06 NOTE — Telephone Encounter (Addendum)
PA denied as pt must try at least 3 statins before insurance will pay for Livalo.   Will try pravastatin 10mg  daily as previously discussed.   Unable to leave message as MB full on both phones.

## 2018-08-14 ENCOUNTER — Other Ambulatory Visit: Payer: Self-pay | Admitting: Interventional Cardiology

## 2018-08-15 ENCOUNTER — Telehealth: Payer: Self-pay

## 2018-08-15 MED ORDER — EVOLOCUMAB 140 MG/ML ~~LOC~~ SOAJ: 140.0000 mg | SUBCUTANEOUS | 3 refills | Status: DC

## 2018-08-15 NOTE — Telephone Encounter (Signed)
Called to let the pt know that the pa is approved and rx sent

## 2018-10-02 ENCOUNTER — Other Ambulatory Visit: Payer: Self-pay

## 2018-10-02 ENCOUNTER — Other Ambulatory Visit: Payer: BC Managed Care – PPO | Admitting: *Deleted

## 2018-10-02 DIAGNOSIS — E7849 Other hyperlipidemia: Secondary | ICD-10-CM

## 2018-10-02 LAB — LIPID PANEL
Chol/HDL Ratio: 4 ratio (ref 0.0–4.4)
Cholesterol, Total: 264 mg/dL — ABNORMAL HIGH (ref 100–199)
HDL: 66 mg/dL (ref >39)
LDL Calculated: 181 mg/dL — ABNORMAL HIGH (ref 0–99)
Triglycerides: 87 mg/dL (ref 0–149)
VLDL Cholesterol Cal: 17 mg/dL (ref 5–40)

## 2018-10-02 LAB — HEPATIC FUNCTION PANEL
ALT: 14 IU/L (ref 0–32)
AST: 15 IU/L (ref 0–40)
Albumin: 4.5 g/dL (ref 3.8–4.8)
Alkaline Phosphatase: 69 IU/L (ref 39–117)
Bilirubin Total: 0.2 mg/dL (ref 0.0–1.2)
Bilirubin, Direct: 0.08 mg/dL (ref 0.00–0.40)
Total Protein: 7.2 g/dL (ref 6.0–8.5)

## 2018-10-03 ENCOUNTER — Telehealth: Payer: Self-pay | Admitting: Pharmacist

## 2018-10-03 ENCOUNTER — Telehealth: Payer: Self-pay | Admitting: Interventional Cardiology

## 2018-10-03 MED ORDER — BEMPEDOIC ACID 180 MG PO TABS: 180.0000 mg | ORAL_TABLET | Freq: Every day | ORAL | 1 refills | Status: DC

## 2018-10-03 NOTE — Telephone Encounter (Signed)
Great. This will be my first Bempedoic

## 2018-10-03 NOTE — Telephone Encounter (Signed)
error 

## 2018-10-03 NOTE — Telephone Encounter (Signed)
Spoke with pt about lipid results from yesterday. Her LDL continues to trend up. She states that about a month ago the pains got so bad she was taking pain pills and after several days she decided to stop both Repatha and pravastatin - she never called our office to make [place] aware. She states that she feels she was like a frog in boiling water - she didn't notice the pains until they were so severe that she couldn't function. She states that she would like to pursue other options if available. Discussed risk/benefit of treating cholesterol. We will attempt change to Praluent to see if this might control her cholesterol with less side effects. She is also interested in starting Nexletol. Will pursue both Nexletol and low dose Praluent. She would prefer to start with low dose to see if tolerates and how much benefit can get on LDL. She is advised on how to obtain copay card for Nexletol. Will notify her once Praluent approved through insurance. She states understanding of above plan.

## 2018-10-07 ENCOUNTER — Telehealth: Payer: Self-pay

## 2018-10-07 MED ORDER — ALIROCUMAB 75 MG/ML ~~LOC~~ SOAJ: 75.0000 mg | SUBCUTANEOUS | 11 refills | Status: DC

## 2018-10-07 NOTE — Telephone Encounter (Signed)
Pt approved for Praluent through insurance. Pt aware and will call with any issues.

## 2018-10-07 NOTE — Telephone Encounter (Signed)
I have done a Nexletol PA through [employer]. Key: [id number]

## 2018-10-08 NOTE — Telephone Encounter (Signed)
Patient called stating that the pharmacy told her with the copay card that Nexletol would be around $170/month. I advised patient call insurance company to see if a tier exception could be done and make sure that the PA was applied. Patient to call back once she speaks to [employer]. I will also leave copay card for patient praluent to pick up.

## 2018-10-08 NOTE — Telephone Encounter (Signed)
Patient called back and states she called insurance and she has not met her deducible for this year. Patient plans to retire this year and does not want to pay out deductible now and then have to pay another deducible when she retires. She is going to call [employer] to discuss further. Patient states she is going to do the praluent injections. She is holding off on Nelextol. Will call us to follow up.

## 2018-10-08 NOTE — Telephone Encounter (Signed)
Letter received from [facility] stating that they have approved the ts [facility] PA. Approval good from 10/07/2018 until 10/07/19  I have notified the pts pharmacy of this approval.

## 2018-10-10 ENCOUNTER — Telehealth: Payer: Self-pay

## 2018-10-10 NOTE — Telephone Encounter (Signed)
YOUR CARDIOLOGY TEAM HAS ARRANGED FOR AN E-VISIT FOR YOUR APPOINTMENT - PLEASE REVIEW IMPORTANT INFORMATION BELOW SEVERAL DAYS PRIOR TO YOUR APPOINTMENT  Due to the recent COVID-19 pandemic, we are transitioning in-person office visits to tele-medicine visits in an effort to decrease unnecessary exposure to our patients, their families, and staff. These visits are billed to your insurance just like a normal visit is. We also encourage you to sign up for MyChart if you have not already done so. You will need a smartphone if possible. For patients that do not have this, we can still complete the visit using a regular telephone but do prefer a smartphone to enable video when possible. You may have a family member that lives with you that can help. If possible, we also ask that you have a blood pressure cuff and scale at home to measure your blood pressure, heart rate and weight prior to your scheduled appointment. Patients with clinical needs that need an in-person evaluation and testing will still be able to come to the office if absolutely necessary. If you have any questions, feel free to call our office.     YOUR PROVIDER WILL BE USING THE FOLLOWING PLATFORM TO COMPLETE YOUR VISIT: Doximity  . IF USING MYCHART - How to Download the MyChart App to Your SmartPhone   - If Apple, go to App Store and type in MyChart in the search bar and download the app. If Android, ask patient to go to Google Play Store and type in MyChart in the search bar and download the app. The app is free but as with any other app downloads, your phone may require you to verify saved payment information or Apple/Android password.  - You will need to then log into the app with your MyChart username and password, and select [facility] as your healthcare provider to link the account.  - When it is time for your visit, go to the MyChart app, find appointments, and click Begin Video Visit. Be sure to Select Allow for your device to  access the Microphone and Camera for your visit. You will then be connected, and your provider will be with you shortly.  **If you have any issues connecting or need assistance, please contact MyChart service desk [phone] ([phone])**  **If using a computer, in order to ensure the best quality for your visit, you will need to use either of the following Internet Browsers: Google Chrome or Microsoft Edge**  . IF USING DOXIMITY or DOXY.ME - The staff will give you instructions on receiving your link to join the meeting the day of your visit.      2-3 DAYS BEFORE YOUR APPOINTMENT  You will receive a telephone call from one of our [facility] team members - your caller ID may say "Unknown caller." If this is a video visit, we will walk you through how to get the video launched on your phone. We will remind you check your blood pressure, heart rate and weight prior to your scheduled appointment. If you have an Apple Watch or Kardia, please upload any pertinent ECG strips the day before or morning of your appointment to MyChart. Our staff will also make sure you have reviewed the consent and agree to move forward with your scheduled tele-health visit.     THE DAY OF YOUR APPOINTMENT  Approximately 15 minutes prior to your scheduled appointment, you will receive a telephone call from one of [facility] team - your caller ID may say "Unknown caller."    Our staff will confirm medications, vital signs for the day and any symptoms you may be experiencing. Please have this information available prior to the time of visit start. It may also be helpful for you to have a pad of paper and pen handy for any instructions given during your visit. They will also walk you through joining the smartphone meeting if this is a video visit.    CONSENT FOR TELE-HEALTH VISIT - PLEASE REVIEW  I hereby voluntarily request, consent and authorize [facility] and its employed or contracted physicians, physician  assistants, nurse practitioners or other licensed health care professionals (the Practitioner), to provide me with telemedicine health care services (the "Services") as deemed necessary by the treating Practitioner. I acknowledge and consent to receive the Services by the Practitioner via telemedicine. I understand that the telemedicine visit will involve communicating with the Practitioner through live audiovisual communication technology and the disclosure of certain medical information by electronic transmission. I acknowledge that I have been given the opportunity to request an in-person assessment or other available alternative prior to the telemedicine visit and am voluntarily participating in the telemedicine visit.  I understand that I have the right to withhold or withdraw my consent to the use of telemedicine in the course of my care at any time, without affecting my right to future care or treatment, and that the Practitioner or I may terminate the telemedicine visit at any time. I understand that I have the right to inspect all information obtained and/or recorded in the course of the telemedicine visit and may receive copies of available information for a reasonable fee.  I understand that some of the potential risks of receiving the Services via telemedicine include:  . Delay or interruption in medical evaluation due to technological equipment failure or disruption; . Information transmitted may not be sufficient (e.g. poor resolution of images) to allow for appropriate medical decision making by the Practitioner; and/or  . In rare instances, security protocols could fail, causing a breach of personal health information.  Furthermore, I acknowledge that it is my responsibility to provide information about my medical history, conditions and care that is complete and accurate to the best of my ability. I acknowledge that Practitioner's advice, recommendations, and/or decision may be based on  factors not within their control, such as incomplete or inaccurate data provided by me or distortions of diagnostic images or specimens that may result from electronic transmissions. I understand that the practice of medicine is not an exact science and that Practitioner makes no warranties or guarantees regarding treatment outcomes. I acknowledge that I will receive a copy of this consent concurrently upon execution via email to the email address I last provided but may also request a printed copy by calling the office of [facility].    I understand that my insurance will be billed for this visit.   I have read or had this consent read to me. . I understand the contents of this consent, which adequately explains the benefits and risks of the Services being provided via telemedicine.  . I have been provided ample opportunity to ask questions regarding this consent and the Services and have had my questions answered to my satisfaction. . I give my informed consent for the services to be provided through the use of telemedicine in my medical care  By participating in this telemedicine visit I agree to the above.  

## 2018-10-10 NOTE — Progress Notes (Signed)
Virtual Visit via Video Note   This visit type was conducted due to national recommendations for restrictions regarding the COVID-19 Pandemic (e.g. social distancing) in an effort to limit this patient's exposure and mitigate transmission in our community.  Due to her co-morbid illnesses, this patient is at least at moderate risk for complications without adequate follow up.  This format is felt to be most appropriate for this patient at this time.  All issues noted in this document were discussed and addressed.  A limited physical exam was performed with this format.  Please refer to the patient's chart for her consent to telehealth for [facility].   Evaluation Performed:  Follow-up visit  Date:  10/11/2018   ID:  Renee [name], DOB December 01, 1950, MRN [id number]  Patient Location: Home Provider Location: Office  PCP:  [name], MD  Cardiologist:  No primary care provider on file.  Electrophysiologist:  None   Chief Complaint:  Asymptomatic CAD/Lipids/Hypertension/DM II  History of Present Illness:    Renee [name] is a 68 y.o. female with familial hyperlipidemia, DM II, low risk coronary calcium score 115 (85th percentile), essential hypertension, and recent history of right sixth nerve palsy.  Has been seen in lipid clinic.  Currently on Repatha for lipid elevation.  Renee [name] is doing well.  She had terrible side effects on Repatha and statin combination.  She is now on Praluent and Nexletol Bempedoic acid). She has no cardiac symptoms.  She is lost weight.  She is motivated for primary prevention.  Her younger daughter has elevated cholesterol.  We discussed primary prevention considerations for her as well.  The younger daughter is less than 46 years of age.  Lipid level should be monitored and perhaps treated after she is above age 21 if indicated by numbers.  Last hemoglobin A1c was less than 5.5.  She is exercising and controlling weight.  The patient does not have  symptoms concerning for COVID-19 infection (fever, chills, cough, or new shortness of breath).    Past Medical History:  Diagnosis Date  . Diabetes mellitus without complication (HCC)   . HSV infection   . Hyperlipidemia   . Hypertension    Past Surgical History:  Procedure Laterality Date  . TOE SURGERY  1996  . TUBAL LIGATION    . WISDOM TOOTH EXTRACTION       Current Meds  Medication Sig  . Alirocumab (PRALUENT) 75 MG/ML SOAJ Inject 75 mg into the skin every 14 (fourteen) days.  Marland Kitchen amLODipine (NORVASC) 5 MG tablet TAKE 1 TABLET BY MOUTH EVERY DAY  . aspirin 81 MG tablet Take 81 mg by mouth daily.  Marland Kitchen loratadine (CLARITIN) 10 MG tablet Take 10 mg by mouth daily.  Marland Kitchen LUMIGAN 0.01 % SOLN Place 1 drop into both eyes every evening.  . metFORMIN (GLUCOPHAGE) 500 MG tablet Take 1,000 mg by mouth daily with breakfast.   . valsartan-hydrochlorothiazide (DIOVAN-HCT) 320-25 MG tablet TAKE 1 TABLET BY MOUTH DAILY.     Allergies:   Codeine   Social History   Tobacco Use  . Smoking status: Never Smoker  . Smokeless tobacco: Never Used  Substance Use Topics  . Alcohol use: Yes    Alcohol/week: 0.0 standard drinks    Comment: Wine occass  . Drug use: No     Family Hx: The patient's family history includes Breast cancer in her maternal aunt and sister; Cancer in her father, maternal aunt, paternal uncle, and sister; Colon cancer in her paternal uncle and  paternal uncle; Diabetes in her daughter; Hyperlipidemia in her maternal grandmother and mother; Hypertension in her maternal grandmother and mother; Lung cancer in her father.  ROS:   Please see the history of present illness.    Muscle aches and pain have resolved off statin and Repatha. All other systems reviewed and are negative.   Prior CV studies:   The following studies were reviewed today:   Coronary calcium score 1 year ago was 118.  Labs/Other Tests and Data Reviewed:    EKG:  No ECG reviewed.  Recent Labs:  10/12/2017: Creatinine, Ser 0.80 10/02/2018: ALT 14   Recent Lipid Panel Lab Results  Component Value Date/Time   CHOL 264 (H) 10/02/2018 08:01 AM   CHOL 346 (H) 06/17/2015 02:31 PM   TRIG 87 10/02/2018 08:01 AM   TRIG 89 06/17/2015 02:31 PM   HDL 66 10/02/2018 08:01 AM   HDL 71 06/17/2015 02:31 PM   CHOLHDL 4.0 10/02/2018 08:01 AM   CHOLHDL 4.9 06/17/2015 02:31 PM   LDLCALC 181 (H) 10/02/2018 08:01 AM   LDLCALC 257 (H) 06/17/2015 02:31 PM    Wt Readings from Last 3 Encounters:  10/11/18 172 lb (78 kg)  11/14/17 183 lb 9.6 oz (83.3 kg)  08/15/17 183 lb 12.8 oz (83.4 kg)     Objective:    Vital Signs:  BP 132/84   Pulse 84   Ht 5\' 4"  (1.626 m)   Wt 172 lb (78 kg)   BMI 29.52 kg/m    VITAL SIGNS:  reviewed GEN:  no acute distress RESPIRATORY:  normal respiratory effort, symmetric expansion NEURO:  alert and oriented x 3, no obvious focal deficit  ASSESSMENT & PLAN:    1. Other hyperlipidemia   2. Essential hypertension   3. Coronary artery calcification seen on CAT scan   4. Controlled type 2 diabetes mellitus with other circulatory complication, without long-term current use of insulin (HCC)   5. Enlarged aorta (HCC)    PLAN:  1. She is enrolled in the lipid clinic working with Renee [name],Target LDL less than or equal to 70 if possible.  Her daughters should consider early lipid intervention if indicated by laboratory testing 2. Blood pressure target 130/80 mmHg is being achieved. 3. Coronary calcium score has been documented and discussed in detail.  Differentiation between a score greater than 100 and less than 100 was discussed. 4. Discussed the importance of early consideration of SGLT2 therapy. 5. When the coronary calcium score was performed, there was no evidence of aortic enlargement.  This problem should be removed from the list of active problems.  Overall education and awareness concerning primary/secondary risk prevention was discussed in detail: LDL  less than 70, hemoglobin A1c less than 7, blood pressure target less than 130/80 mmHg, >150 minutes of moderate aerobic activity per week, avoidance of smoking, weight control (via diet and exercise), and continued surveillance/management of/for obstructive sleep apnea.   COVID-19 Education: The signs and symptoms of COVID-19 were discussed with the patient and how to seek care for testing (follow up with PCP or arrange E-visit).  The importance of social distancing was discussed today.  Time:   Today, I have spent 19 minutes with the patient with telehealth technology discussing the above problems.     Medication Adjustments/Labs and Tests Ordered: Current medicines are reviewed at length with the patient today.  Concerns regarding medicines are outlined above.   Tests Ordered: No orders of the defined types were placed in this encounter.  Medication Changes: No orders of the defined types were placed in this encounter.   Disposition:  Follow up in 1 year(s)  Signed, Lesleigh Noe, MD  10/11/2018 9:18 AM    [facility]

## 2018-10-11 ENCOUNTER — Other Ambulatory Visit: Payer: Self-pay

## 2018-10-11 ENCOUNTER — Encounter: Payer: Self-pay | Admitting: Interventional Cardiology

## 2018-10-11 ENCOUNTER — Telehealth (INDEPENDENT_AMBULATORY_CARE_PROVIDER_SITE_OTHER): Payer: BC Managed Care – PPO | Admitting: Interventional Cardiology

## 2018-10-11 VITALS — BP 132/84 | HR 84 | Ht 64.0 in | Wt 172.0 lb

## 2018-10-11 DIAGNOSIS — I2510 Atherosclerotic heart disease of native coronary artery without angina pectoris: Secondary | ICD-10-CM

## 2018-10-11 DIAGNOSIS — E1159 Type 2 diabetes mellitus with other circulatory complications: Secondary | ICD-10-CM

## 2018-10-11 DIAGNOSIS — E7849 Other hyperlipidemia: Secondary | ICD-10-CM | POA: Diagnosis not present

## 2018-10-11 DIAGNOSIS — I7789 Other specified disorders of arteries and arterioles: Secondary | ICD-10-CM

## 2018-10-11 DIAGNOSIS — I10 Essential (primary) hypertension: Secondary | ICD-10-CM

## 2018-10-11 NOTE — Patient Instructions (Signed)

## 2018-11-07 ENCOUNTER — Other Ambulatory Visit: Payer: Self-pay | Admitting: Interventional Cardiology

## 2018-11-07 MED ORDER — AMLODIPINE BESYLATE 5 MG PO TABS: 5.0000 mg | ORAL_TABLET | Freq: Every day | ORAL | 3 refills | Status: DC

## 2018-11-19 ENCOUNTER — Other Ambulatory Visit: Payer: Self-pay | Admitting: Interventional Cardiology

## 2018-12-18 ENCOUNTER — Other Ambulatory Visit: Payer: Self-pay | Admitting: Family Medicine

## 2018-12-18 DIAGNOSIS — R911 Solitary pulmonary nodule: Secondary | ICD-10-CM

## 2018-12-30 ENCOUNTER — Ambulatory Visit
Admission: RE | Admit: 2018-12-30 | Discharge: 2018-12-30 | Disposition: A | Discharge status: Home / Self Care | Payer: Medicare Other | Source: Ambulatory Visit | Attending: Family Medicine | Admitting: Family Medicine

## 2018-12-30 DIAGNOSIS — R911 Solitary pulmonary nodule: Secondary | ICD-10-CM

## 2019-01-26 NOTE — Progress Notes (Deleted)
Cardiology Office Note:    Date:  01/26/2019   ID:  Renee [name], DOB 30-Dec-1950, MRN [id number]  PCP:  Henrine Screwshacker, Robert, MD  Cardiologist:  No primary care provider on file.   Referring MD: Henrine Screwshacker, Robert, MD   No chief complaint on file.   History of Present Illness:    Renee [name] is a 68 y.o. female with a hx of familialhyperlipidemia, DM II,low risk coronary calcium score 115(85thpercentile),essential hypertension, and recent history of right sixth nerve palsy. Has been seen in lipid clinic. Currently on Repatha for lipid elevation.  ***  Past Medical History:  Diagnosis Date  . Diabetes mellitus without complication (HCC)   . HSV infection   . Hyperlipidemia   . Hypertension     Past Surgical History:  Procedure Laterality Date  . TOE SURGERY  1996  . TUBAL LIGATION    . WISDOM TOOTH EXTRACTION      Current Medications: No outpatient medications have been marked as taking for the 01/27/19 encounter (Appointment) with Lyn RecordsSmith, Henry W, MD.     Allergies:   Codeine   Social History   Socioeconomic History  . Marital status: Divorced    Spouse name: Not on file  . Number of children: 2  . Years of education: 16+  . Highest education level: Not on file  Occupational History  . Not on file  Social Needs  . Financial resource strain: Not on file  . Food insecurity    Worry: Not on file    Inability: Not on file  . Transportation needs    Medical: Not on file    Non-medical: Not on file  Tobacco Use  . Smoking status: Never Smoker  . Smokeless tobacco: Never Used  Substance and Sexual Activity  . Alcohol use: Yes    Alcohol/week: 0.0 standard drinks    Comment: Wine occass  . Drug use: No  . Sexual activity: Never    Comment: BTL   Lifestyle  . Physical activity    Days per week: Not on file    Minutes per session: Not on file  . Stress: Not on file  Relationships  . Social Musicianconnections    Talks on phone: Not on file    Gets  together: Not on file    Attends religious service: Not on file    Active member of club or organization: Not on file    Attends meetings of clubs or organizations: Not on file    Relationship status: Not on file  Other Topics Concern  . Not on file  Social History Narrative   Lives at home with herself with dog.   Caffeine use: Drink 2 cups coffee per day     Family History: The patient's family history includes Breast cancer in her maternal aunt and sister; Cancer in her father, maternal aunt, paternal uncle, and sister; Colon cancer in her paternal uncle and paternal uncle; Diabetes in her daughter; Hyperlipidemia in her maternal grandmother and mother; Hypertension in her maternal grandmother and mother; Lung cancer in her father.  ROS:   Please see the history of present illness.    *** All other systems reviewed and are negative.  EKGs/Labs/Other Studies Reviewed:    The following studies were reviewed today: ***  EKG:  EKG ***  Recent Labs: 10/02/2018: ALT 14  Recent Lipid Panel    Component Value Date/Time   CHOL 264 (H) 10/02/2018 0801   CHOL 346 (H) 06/17/2015 1431   TRIG 87  10/02/2018 0801   TRIG 89 06/17/2015 1431   HDL 66 10/02/2018 0801   HDL 71 06/17/2015 1431   CHOLHDL 4.0 10/02/2018 0801   CHOLHDL 4.9 06/17/2015 1431   LDLCALC 181 (H) 10/02/2018 0801   LDLCALC 257 (H) 06/17/2015 1431    Physical Exam:    VS:  There were no vitals taken for this visit.    Wt Readings from Last 3 Encounters:  10/11/18 172 lb (78 kg)  11/14/17 183 lb 9.6 oz (83.3 kg)  08/15/17 183 lb 12.8 oz (83.4 kg)     GEN: ***. No acute distress HEENT: Normal NECK: No JVD. LYMPHATICS: No lymphadenopathy CARDIAC: *** RRR without murmur, gallop, or edema. VASCULAR: *** Normal Pulses. No bruits. RESPIRATORY:  Clear to auscultation without rales, wheezing or rhonchi  ABDOMEN: Soft, non-tender, non-distended, No pulsatile mass, MUSCULOSKELETAL: No deformity  SKIN: Warm and dry  NEUROLOGIC:  Alert and oriented x 3 PSYCHIATRIC:  Normal affect   ASSESSMENT:    1. Coronary artery calcification seen on CAT scan   2. Controlled type 2 diabetes mellitus with other circulatory complication, without long-term current use of insulin ([place])   3. Essential hypertension   4. Other hyperlipidemia   5. Enlarged aorta (HCC)   6. Educated About Covid-19 Virus Infection    PLAN:    In order of problems listed above:  1. ***   Medication Adjustments/Labs and Tests Ordered: Current medicines are reviewed at length with the patient today.  Concerns regarding medicines are outlined above.  No orders of the defined types were placed in this encounter.  No orders of the defined types were placed in this encounter.   There are no Patient Instructions on file for this visit.   Signed, Sinclair Grooms, MD  01/26/2019 10:19 AM    [place]

## 2019-01-27 ENCOUNTER — Telehealth: Payer: Self-pay | Admitting: Interventional Cardiology

## 2019-01-27 ENCOUNTER — Ambulatory Visit: Payer: BC Managed Care – PPO | Admitting: Interventional Cardiology

## 2019-01-27 NOTE — Telephone Encounter (Signed)
Patient is due for lipid labs. Called and left a message for patient to return call to set up

## 2019-01-27 NOTE — Telephone Encounter (Signed)
Pt does not need labs for Dr. Tamala Julian.  Will forward to PharmD to see if pt needs labs anytime soon for them in regards to her Praluent.

## 2019-01-27 NOTE — Telephone Encounter (Signed)
New Message:    Pt wants to know if she needs lab work. If,so, she will need an order. Las saw DR Tamala Julian on 10-11-18.

## 2019-01-29 ENCOUNTER — Telehealth: Payer: Self-pay

## 2019-01-29 NOTE — Telephone Encounter (Signed)
Called to schedule labs but the pt was in a rush and stated that they would call the clinic back to schedule labs

## 2019-01-29 NOTE — Telephone Encounter (Signed)
CMA called and spoke with patient who states she was in a rush and would call us back to schedule

## 2019-03-19 ENCOUNTER — Other Ambulatory Visit: Payer: Self-pay | Admitting: Interventional Cardiology

## 2019-03-26 ENCOUNTER — Telehealth: Payer: Self-pay | Admitting: Interventional Cardiology

## 2019-03-26 NOTE — Telephone Encounter (Signed)
[facility] is requesting [facility] refill on Bempedoic acid (Nexletol). Would Dr. Tamala Julian like to refill this medication? Please address

## 2019-03-26 NOTE — Telephone Encounter (Signed)
Will route to pharmacy.  Doesn't look like she has had her f/u labs.  I'm not terribly familiar with Bempedoic Acid so unfamiliar with protocols on labs.

## 2019-03-27 MED ORDER — BEMPEDOIC ACID 180 MG PO TABS: 180.0000 mg | ORAL_TABLET | Freq: Every day | ORAL | 0 refills | Status: DC

## 2019-03-27 NOTE — Telephone Encounter (Signed)
Patient overdue to repeat fastin lipid panel and CMET.  Will refill bempedoic acid for 30 days while blood work completed.

## 2019-03-27 NOTE — Telephone Encounter (Signed)
Called two of the numbers on the list to schedule fasting labs and pt hung up on me

## 2019-04-11 DIAGNOSIS — S46012A Strain of muscle(s) and tendon(s) of the rotator cuff of left shoulder, initial encounter: Secondary | ICD-10-CM | POA: Insufficient documentation

## 2019-04-20 ENCOUNTER — Other Ambulatory Visit: Payer: Self-pay | Admitting: Interventional Cardiology

## 2019-04-30 NOTE — Telephone Encounter (Signed)
Pt called clinic and states she had lipid panel and BMET drawn at [facility] on 04/18/19

## 2019-09-08 ENCOUNTER — Other Ambulatory Visit: Payer: Self-pay | Admitting: Interventional Cardiology

## 2019-11-22 ENCOUNTER — Other Ambulatory Visit: Payer: Self-pay | Admitting: Interventional Cardiology

## 2019-12-07 ENCOUNTER — Other Ambulatory Visit: Payer: Self-pay | Admitting: Interventional Cardiology

## 2019-12-16 NOTE — Progress Notes (Signed)
Cardiology Office Note:    Date:  12/17/2019   ID:  Renee [name], DOB 05/13/1951, MRN [id number]  PCP:  [name], MD  Cardiologist:  No primary care provider on file.   Referring MD: [name], MD   Chief Complaint  Patient presents with  . Advice Only    CAD  . Hyperlipidemia  . Hypertension    History of Present Illness:    Renee [name] is a 69 y.o. female with a hx of familialhyperlipidemia, DM II,low risk coronary calcium score 115(85thpercentile),essential hypertension, and recent history of right sixth nerve palsy. Has been seen in lipid clinic. Currently on Repatha for lipid elevation.  She has no cardiovascular complaints.  We are concerned with risk prevention as she has what appears to be familial hyperlipidemia, hypertension, and diabetes mellitus.    Past Medical History:  Diagnosis Date  . Diabetes mellitus without complication (HCC)   . HSV infection   . Hyperlipidemia   . Hypertension     Past Surgical History:  Procedure Laterality Date  . TOE SURGERY  1996  . TUBAL LIGATION    . WISDOM TOOTH EXTRACTION      Current Medications: Current Meds  Medication Sig  . amLODipine (NORVASC) 5 MG tablet Take 1 tablet (5 mg total) by mouth daily. Keep follow up visit to receive further refills. Thank you.  Marland Kitchen aspirin 81 MG tablet Take 81 mg by mouth daily.  Marland Kitchen LUMIGAN 0.01 % SOLN Place 1 drop into both eyes every evening.  . metFORMIN (GLUCOPHAGE) 500 MG tablet Take 1,000 mg by mouth daily with breakfast.   . NEXLETOL 180 MG TABS TAKE 1 TABLET EVERY DAY  . PRALUENT 75 MG/ML SOAJ INJECT 75 MG INTO THE SKIN EVERY 14 (FOURTEEN) DAYS.  Marland Kitchen valACYclovir (VALTREX) 500 MG tablet Take 200 mg by mouth as needed.  . valsartan-hydrochlorothiazide (DIOVAN-HCT) 320-25 MG tablet Take 1 tablet by mouth daily. Keep follow up visit to receive further refills. Thank you.     Allergies:   Codeine   Social History   Socioeconomic History  .  Marital status: Divorced    Spouse name: Not on file  . Number of children: 2  . Years of education: 16+  . Highest education level: Not on file  Occupational History  . Not on file  Tobacco Use  . Smoking status: Never Smoker  . Smokeless tobacco: Never Used  Substance and Sexual Activity  . Alcohol use: Yes    Alcohol/week: 0.0 standard drinks    Comment: Wine occass  . Drug use: No  . Sexual activity: Never    Comment: BTL   Other Topics Concern  . Not on file  Social History Narrative   Lives at home with herself with dog.   Caffeine use: Drink 2 cups coffee per day   Social Determinants of Health   Financial Resource Strain:   . Difficulty of Paying Living Expenses:   Food Insecurity:   . Worried About Programme researcher, broadcasting/film/video in the Last Year:   . Barista in the Last Year:   Transportation Needs:   . Freight forwarder (Medical):   Marland Kitchen Lack of Transportation (Non-Medical):   Physical Activity:   . Days of Exercise per Week:   . Minutes of Exercise per Session:   Stress:   . Feeling of Stress :   Social Connections:   . Frequency of Communication with Friends and Family:   . Frequency of Social Gatherings  with Friends and Family:   . Attends Religious Services:   . Active Member of Clubs or Organizations:   . Attends Banker Meetings:   Marland Kitchen Marital Status:      Family History: The patient's family history includes Breast cancer in her maternal aunt and sister; Cancer in her father, maternal aunt, paternal uncle, and sister; Colon cancer in her paternal uncle and paternal uncle; Diabetes in her daughter; Hyperlipidemia in her maternal grandmother and mother; Hypertension in her maternal grandmother and mother; Lung cancer in her father.  ROS:   Please see the history of present illness.    She will be retiring from work within the next year or so.  All other systems reviewed and are negative.  EKGs/Labs/Other Studies Reviewed:    The  following studies were reviewed today: No recent cardiac data repeated.  EKG:  EKG normal sinus rhythm with nonspecific T wave flattening and poor R wave progression.  When compared to 2019,  Cardiac CT June 2019: IMPRESSION: 1. Coronary calcium score of 107. This was 50 percentile for age and sex matched control.  2. Normal coronary origin with left dominance.  3. Mild CAD in the proximal LAD and LCX arteries. Aggressive risk factor modification is recommended.  4. Mildly dilated pulmonary artery measuring 33 mm.  5. Normal size of the thoracic aorta. There is no evidence for ascending aortic aneurysm.  Recent Labs: No results found for requested labs within last 8760 hours.  Recent Lipid Panel    Component Value Date/Time   CHOL 264 (H) 10/02/2018 0801   CHOL 346 (H) 06/17/2015 1431   TRIG 87 10/02/2018 0801   TRIG 89 06/17/2015 1431   HDL 66 10/02/2018 0801   HDL 71 06/17/2015 1431   CHOLHDL 4.0 10/02/2018 0801   CHOLHDL 4.9 06/17/2015 1431   LDLCALC 181 (H) 10/02/2018 0801   LDLCALC 257 (H) 06/17/2015 1431    Physical Exam:    VS:  BP 118/78   Pulse 82   Ht 5\' 4"  (1.626 m)   Wt 176 lb 3.2 oz (79.9 kg)   SpO2 98%   BMI 30.24 kg/m     Wt Readings from Last 3 Encounters:  12/17/19 176 lb 3.2 oz (79.9 kg)  10/11/18 172 lb (78 kg)  11/14/17 183 lb 9.6 oz (83.3 kg)     GEN: Moderate obesity. No acute distress HEENT: Normal NECK: No JVD. LYMPHATICS: No lymphadenopathy CARDIAC:  RRR without murmur, gallop, or edema. VASCULAR:  Normal Pulses. No bruits. RESPIRATORY:  Clear to auscultation without rales, wheezing or rhonchi  ABDOMEN: Soft, non-tender, non-distended, No pulsatile mass, MUSCULOSKELETAL: No deformity  SKIN: Warm and dry NEUROLOGIC:  Alert and oriented x 3 PSYCHIATRIC:  Normal affect   ASSESSMENT:    1. Other hyperlipidemia   2. Essential hypertension   3. Coronary artery calcification seen on CAT scan   4. Controlled type 2 diabetes  mellitus with other circulatory complication, without long-term current use of insulin (HCC)   5. Enlarged aorta (HCC)   6. Educated about COVID-19 virus infection    PLAN:    In order of problems listed above:  1. Lipids are terrible with the patient using Praluent and bempedoic acid.  Most recent LDL was 140 in June 2021.  Will refer to Dr. July 2021 to be considered for advanced therapies and possibly trial with small inhibiting molecules or other ideas.  At the very least she should be bempedoic acid/ezetimibe. 2. Well controlled with target  130/80 mmHg. 3. Primary prevention it is our goal as described below. 4. Hemoglobin A1c is reasonable at 6.4. 5. We have not reassessed aorta.  This will be done in another year or so. 6. She has been vaccinated.  Encouraged to maintain social distancing Morrie Sheldon when indoors.  Overall education and awareness concerning primary/secondary risk prevention was discussed in detail: LDL less than 70, hemoglobin A1c less than 7, blood pressure target less than 130/80 mmHg, >150 minutes of moderate aerobic activity per week, avoidance of smoking, weight control (via diet and exercise), and continued surveillance/management of/for obstructive sleep apnea.      Medication Adjustments/Labs and Tests Ordered: Current medicines are reviewed at length with the patient today.  Concerns regarding medicines are outlined above.  Orders Placed This Encounter  Procedures  . EKG 12-Lead   No orders of the defined types were placed in this encounter.   There are no Patient Instructions on file for this visit.   Signed, Lesleigh Noe, MD  12/17/2019 8:19 AM    [facility]

## 2019-12-17 ENCOUNTER — Ambulatory Visit (INDEPENDENT_AMBULATORY_CARE_PROVIDER_SITE_OTHER): Payer: Medicare Other | Admitting: Interventional Cardiology

## 2019-12-17 ENCOUNTER — Other Ambulatory Visit: Payer: Self-pay

## 2019-12-17 ENCOUNTER — Encounter: Payer: Self-pay | Admitting: Interventional Cardiology

## 2019-12-17 VITALS — BP 118/78 | HR 82 | Ht 64.0 in | Wt 176.2 lb

## 2019-12-17 DIAGNOSIS — E7849 Other hyperlipidemia: Secondary | ICD-10-CM

## 2019-12-17 DIAGNOSIS — I10 Essential (primary) hypertension: Secondary | ICD-10-CM

## 2019-12-17 DIAGNOSIS — Z7189 Other specified counseling: Secondary | ICD-10-CM

## 2019-12-17 DIAGNOSIS — E1159 Type 2 diabetes mellitus with other circulatory complications: Secondary | ICD-10-CM | POA: Diagnosis not present

## 2019-12-17 DIAGNOSIS — I2510 Atherosclerotic heart disease of native coronary artery without angina pectoris: Secondary | ICD-10-CM

## 2019-12-17 DIAGNOSIS — I7789 Other specified disorders of arteries and arterioles: Secondary | ICD-10-CM

## 2019-12-17 NOTE — Patient Instructions (Signed)
Medication Instructions:  Your physician recommends that you continue on your current medications as directed. Please refer to the Current Medication list given to you today.  *If you need a refill on your cardiac medications before your next appointment, please call your pharmacy*   Lab Work: None If you have labs (blood work) drawn today and your tests are completely normal, you will receive your results only by: Marland Kitchen MyChart Message (if you have MyChart) OR . A paper copy in the mail If you have any lab test that is abnormal or we need to change your treatment, we will call you to review the results.   Testing/Procedures: None   Follow-Up:  You have been referred to Dr. Rennis Golden with our [facility].   At [facility], you and your health needs are our priority.  As part of our continuing mission to provide you with exceptional heart care, we have created designated Provider Care Teams.  These Care Teams include your primary Cardiologist (physician) and Advanced Practice Providers (APPs -  Physician Assistants and Nurse Practitioners) who all work together to provide you with the care you need, when you need it.  We recommend signing up for the patient portal called "MyChart".  Sign up information is provided on this After Visit Summary.  MyChart is used to connect with patients for Virtual Visits (Telemedicine).  Patients are able to view lab/test results, encounter notes, upcoming appointments, etc.  Non-urgent messages can be sent to your provider as well.   To learn more about what you can do with MyChart, go to [email or web address].    Your next appointment:   12 month(s)  The format for your next appointment:   In Person  Provider:   You may see Dr. Verdis Prime or one of the following Advanced Practice Providers on your designated Care Team:    Norma Fredrickson, NP  Nada Boozer, NP  Georgie Chard, NP    Other Instructions

## 2019-12-29 ENCOUNTER — Telehealth: Payer: Self-pay

## 2019-12-29 NOTE — Telephone Encounter (Signed)
**Note De-Identified Meili Kleckley Obfuscation** I started a Nexletol PA through covermymeds: Key: [id number]

## 2019-12-30 NOTE — Telephone Encounter (Addendum)
**Note De-Identified Kynlea Blackston Obfuscation** Letter received from [facility] [employer] stating that they have approved the pts Nexletol PA. Approval is valid from 12/29/2019 until 12/29/2022. PA Case ID: [id number]-([facility] covermymeds)  I have notified [facility] of this approval.

## 2020-01-07 DIAGNOSIS — E119 Type 2 diabetes mellitus without complications: Secondary | ICD-10-CM | POA: Insufficient documentation

## 2020-01-19 ENCOUNTER — Other Ambulatory Visit: Payer: Self-pay | Admitting: Obstetrics and Gynecology

## 2020-01-19 DIAGNOSIS — Z1231 Encounter for screening mammogram for malignant neoplasm of breast: Secondary | ICD-10-CM

## 2020-02-03 ENCOUNTER — Other Ambulatory Visit: Payer: Self-pay

## 2020-02-03 ENCOUNTER — Ambulatory Visit
Admission: RE | Admit: 2020-02-03 | Discharge: 2020-02-03 | Disposition: A | Discharge status: Home / Self Care | Payer: Medicare Other | Source: Ambulatory Visit | Attending: Obstetrics and Gynecology | Admitting: Obstetrics and Gynecology

## 2020-02-03 DIAGNOSIS — Z1231 Encounter for screening mammogram for malignant neoplasm of breast: Secondary | ICD-10-CM

## 2020-02-19 ENCOUNTER — Ambulatory Visit (INDEPENDENT_AMBULATORY_CARE_PROVIDER_SITE_OTHER): Payer: Medicare Other | Admitting: Internal Medicine

## 2020-02-19 ENCOUNTER — Encounter: Payer: Self-pay | Admitting: Internal Medicine

## 2020-02-19 ENCOUNTER — Other Ambulatory Visit: Payer: Self-pay

## 2020-02-19 VITALS — BP 130/76 | HR 104 | Temp 96.1°F | Ht 64.0 in | Wt 175.8 lb

## 2020-02-19 DIAGNOSIS — T466X5A Adverse effect of antihyperlipidemic and antiarteriosclerotic drugs, initial encounter: Secondary | ICD-10-CM

## 2020-02-19 DIAGNOSIS — E1159 Type 2 diabetes mellitus with other circulatory complications: Secondary | ICD-10-CM | POA: Diagnosis not present

## 2020-02-19 DIAGNOSIS — M791 Myalgia, unspecified site: Secondary | ICD-10-CM

## 2020-02-19 DIAGNOSIS — E7801 Familial hypercholesterolemia: Secondary | ICD-10-CM

## 2020-02-19 DIAGNOSIS — I2510 Atherosclerotic heart disease of native coronary artery without angina pectoris: Secondary | ICD-10-CM | POA: Diagnosis not present

## 2020-02-19 MED ORDER — PRALUENT 150 MG/ML ~~LOC~~ SOAJ: 1.0000 | SUBCUTANEOUS | 10 refills | Status: DC

## 2020-02-19 NOTE — Progress Notes (Signed)
LIPID CLINIC CONSULT NOTE  Chief Complaint:  Manage dyslipidemia  Primary Care Physician: [name], MD  Primary Cardiologist:  Chrystie Nose, MD  HPI:  Renee [name] is a 69 y.o. female who is being seen today for the evaluation of dyslipidemia at the request of Lyn Records, MD.  This is a pleasant 69 year old female patient of Dr. Katrinka Blazing with a history of hypertension, longstanding dyslipidemia which has been difficult to treat as well as a family history of dyslipidemia and heart disease in her mother.  She underwent CT coronary angiography in May 2019 which showed two-vessel coronary disease with mild disease in the proximal LAD and circumflex arteries as well as a calcium score 107, 80th percentile for age and sex matched control.  Unfortunately she has been intolerant of statins causing significant myalgias.  She was seen and treated by my pharmacist team in the lipid clinic who placed her on bempedoic acid.  She reports compliance with this and was also placed on Praluent 75 mg every 2 weeks.  Despite this, her lipids in April 2020 showed total cholesterol 264, triglycerides 87, HDL 66 and LDL 181.  2 years prior to that her LDL had been 116.  More recently her PCP drew lipids which showed total cholesterol 222, HDL 68, LDL 140 and triglycerides 81.  Hemoglobin A1c was 6.4.  Her target LDL is less than 70.  PMHx:  Past Medical History:  Diagnosis Date   Diabetes mellitus without complication (HCC)    HSV infection    Hyperlipidemia    Hypertension     Past Surgical History:  Procedure Laterality Date   TOE SURGERY  1996   TUBAL LIGATION     WISDOM TOOTH EXTRACTION      FAMHx:  Family History  Problem Relation Age of Onset   Hyperlipidemia Mother    Hypertension Mother    Lung cancer Father    Cancer Father    Breast cancer Sister    Cancer Sister    Breast cancer Maternal Aunt    Cancer Maternal Aunt    Hyperlipidemia Maternal  Grandmother    Hypertension Maternal Grandmother    Colon cancer Paternal Uncle    Cancer Paternal Uncle    Colon cancer Paternal Uncle    Diabetes Daughter     SOCHx:   reports that she has never smoked. She has never used smokeless tobacco. She reports current alcohol use. She reports that she does not use drugs.  ALLERGIES:  Allergies  Allergen Reactions   Codeine Nausea Only    ROS: Pertinent items noted in HPI and remainder of comprehensive ROS otherwise negative.  HOME MEDS: Current Outpatient Medications on File Prior to Visit  Medication Sig Dispense Refill   amLODipine (NORVASC) 5 MG tablet Take 1 tablet (5 mg total) by mouth daily. Keep follow up visit to receive further refills. Thank you. 30 tablet 0   aspirin 81 MG tablet Take 81 mg by mouth daily.     LUMIGAN 0.01 % SOLN Place 1 drop into both eyes every evening.     metFORMIN (GLUCOPHAGE) 500 MG tablet Take 1,000 mg by mouth daily with breakfast.      NEXLETOL 180 MG TABS TAKE 1 TABLET EVERY DAY 90 tablet 3   PRALUENT 75 MG/ML SOAJ INJECT 75 MG INTO THE SKIN EVERY 14 (FOURTEEN) DAYS. 2 pen 11   valACYclovir (VALTREX) 500 MG tablet Take 200 mg by mouth as needed.  valsartan-hydrochlorothiazide (DIOVAN-HCT) 320-25 MG tablet Take 1 tablet by mouth daily. Keep follow up visit to receive further refills. Thank you. 30 tablet 0   No current facility-administered medications on file prior to visit.    LABS/IMAGING: No results found for this or any previous visit (from the past 48 hour(s)). No results found.  LIPID PANEL:    Component Value Date/Time   CHOL 264 (H) 10/02/2018 0801   CHOL 346 (H) 06/17/2015 1431   TRIG 87 10/02/2018 0801   TRIG 89 06/17/2015 1431   HDL 66 10/02/2018 0801   HDL 71 06/17/2015 1431   CHOLHDL 4.0 10/02/2018 0801   CHOLHDL 4.9 06/17/2015 1431   LDLCALC 181 (H) 10/02/2018 0801   LDLCALC 257 (H) 06/17/2015 1431    WEIGHTS: Wt Readings from Last 3 Encounters:   02/19/20 175 lb 12.8 oz (79.7 kg)  12/17/19 176 lb 3.2 oz (79.9 kg)  10/11/18 172 lb (78 kg)    VITALS: BP 130/76    Pulse (!) 104    Temp (!) 96.1 F (35.6 C)    Ht 5\' 4"  (1.626 m)    Wt 175 lb 12.8 oz (79.7 kg)    SpO2 98%    BMI 30.18 kg/m   EXAM: Deferred  EKG: Deferred  ASSESSMENT: 1. Probable familial hyperlipidemia 2. Two-vessel coronary disease with calcium score of 107, 80th percentile for age and sex matched control 3. Family history of dyslipidemia and heart disease 4. Statin intolerance-myalgias  PLAN: 1.   [name] has a probable familial hyperlipidemia with a treated LDL which has been well over 200.  She had been approved for Praluent however recently had a shoulder injury and says that she may have been taking the medication every 3 or 4 weeks rather than every 2 weeks as she noticed that she had extra pens building up in her refrigerator.  This could have caused some of the variability in her lipid levels.  She also reports compliance with bempedoic acid.  Weight has been stable and she says her diet is unchanged.  She is still well above target LDL and I think we should therefore increase her Praluent dose to 150 mg every 2 weeks.  In addition we may add Zetia to her Nexletol for further benefit.  Plan repeat lipids in about 3 months and follow-up with me at that time.  Thanks as always for the kind referral.  [name], MD, Chrystie Nose  [place]   [facility]  Medical Director of the Advanced Lipid Disorders &  Cardiovascular Risk Reduction Clinic Diplomate of the American Board of Clinical Lipidology Attending Cardiologist  Direct Dial: [phone]   Fax: [phone]  Website:  [email or web address] Renee [name] 02/19/2020, 4:30 PM

## 2020-02-19 NOTE — Patient Instructions (Signed)
Medication Instructions:  INCREASE PRALUENT  150 MG/ML  INJECTABLE EVERY 14 DAYS  *If you need a refill on your cardiac medications before your next appointment, please call your pharmacy*   Lab Work: FASTING LIPIDS IN DEC 2021 If you have labs (blood work) drawn today and your tests are completely normal, you will receive your results only by: Marland Kitchen MyChart Message (if you have MyChart) OR . A paper copy in the mail If you have any lab test that is abnormal or we need to change your treatment, we will call you to review the results.   Testing/Procedures: NOT NEEDED   Follow-Up: At [facility], you and your health needs are our priority.  As part of our continuing mission to provide you with exceptional heart care, we have created designated Provider Care Teams.  These Care Teams include your primary Cardiologist (physician) and Advanced Practice Providers (APPs -  Physician Assistants and Nurse Practitioners) who all work together to provide you with the care you need, when you need it.      Your next appointment:   3 month(s)  The format for your next appointment:   In Person  Provider:   K. Italy Hilty, MD

## 2020-05-03 ENCOUNTER — Ambulatory Visit: Payer: Medicare Other | Attending: Family

## 2020-05-03 DIAGNOSIS — Z23 Encounter for immunization: Secondary | ICD-10-CM

## 2020-05-17 ENCOUNTER — Other Ambulatory Visit: Payer: Self-pay

## 2020-05-17 ENCOUNTER — Other Ambulatory Visit: Payer: Medicare Other | Admitting: *Deleted

## 2020-05-18 LAB — LIPID PANEL
Chol/HDL Ratio: 2.5 ratio (ref 0.0–4.4)
Cholesterol, Total: 158 mg/dL (ref 100–199)
HDL: 63 mg/dL (ref >39)
LDL Chol Calc (NIH): 85 mg/dL (ref 0–99)
Triglycerides: 48 mg/dL (ref 0–149)
VLDL Cholesterol Cal: 10 mg/dL (ref 5–40)

## 2020-05-19 ENCOUNTER — Ambulatory Visit: Payer: Medicare Other | Admitting: Internal Medicine

## 2020-05-19 ENCOUNTER — Other Ambulatory Visit: Payer: Self-pay

## 2020-05-19 ENCOUNTER — Ambulatory Visit (INDEPENDENT_AMBULATORY_CARE_PROVIDER_SITE_OTHER): Payer: Medicare Other | Admitting: Internal Medicine

## 2020-05-19 ENCOUNTER — Encounter: Payer: Self-pay | Admitting: Internal Medicine

## 2020-05-19 VITALS — BP 147/82 | HR 78 | Ht 64.5 in | Wt 180.0 lb

## 2020-05-19 DIAGNOSIS — E7849 Other hyperlipidemia: Secondary | ICD-10-CM | POA: Diagnosis not present

## 2020-05-19 DIAGNOSIS — E7801 Familial hypercholesterolemia: Secondary | ICD-10-CM

## 2020-05-19 DIAGNOSIS — I2510 Atherosclerotic heart disease of native coronary artery without angina pectoris: Secondary | ICD-10-CM | POA: Diagnosis not present

## 2020-05-19 DIAGNOSIS — M791 Myalgia, unspecified site: Secondary | ICD-10-CM

## 2020-05-19 DIAGNOSIS — T466X5A Adverse effect of antihyperlipidemic and antiarteriosclerotic drugs, initial encounter: Secondary | ICD-10-CM

## 2020-05-19 NOTE — Patient Instructions (Signed)
Medication Instructions:  Your physician recommends that you continue on your current medications as directed. Please refer to the Current Medication list given to you today.  *If you need a refill on your cardiac medications before your next appointment, please call your pharmacy*   Lab Work: FASTING lab work in 6 months   If you have labs (blood work) drawn today and your tests are completely normal, you will receive your results only by: Marland Kitchen MyChart Message (if you have MyChart) OR . A paper copy in the mail If you have any lab test that is abnormal or we need to change your treatment, we will call you to review the results.   Testing/Procedures: NONE   Follow-Up: At [facility], you and your health needs are our priority.  As part of our continuing mission to provide you with exceptional heart care, we have created designated Provider Care Teams.  These Care Teams include your primary Cardiologist (physician) and Advanced Practice Providers (APPs -  Physician Assistants and Nurse Practitioners) who all work together to provide you with the care you need, when you need it.  We recommend signing up for the patient portal called "MyChart".  Sign up information is provided on this After Visit Summary.  MyChart is used to connect with patients for Virtual Visits (Telemedicine).  Patients are able to view lab/test results, encounter notes, upcoming appointments, etc.  Non-urgent messages can be sent to your provider as well.   To learn more about what you can do with MyChart, go to [email or web address].    Your next appointment:   6 month(s)  The format for your next appointment:   In Person  Provider:    Dr. Zoila Shutter - lipid clinic    Other Instructions Labs from 05/17/2020 Total cholesterol 158  LDL 85 Triglycerides 48 HDL: 63

## 2020-05-19 NOTE — Progress Notes (Signed)
LIPID CLINIC CONSULT NOTE  Chief Complaint:  Manage dyslipidemia  Primary Care Physician: [name], MD  Primary Cardiologist:  Lesleigh Noe, MD  HPI:  Renee [name] is a 69 y.o. female who is being seen today for the evaluation of dyslipidemia at the request of [name], MD.  This is a pleasant 69 year old female patient of Dr. Katrinka Blazing with a history of hypertension, longstanding dyslipidemia which has been difficult to treat as well as a family history of dyslipidemia and heart disease in her mother.  She underwent CT coronary angiography in May 2019 which showed two-vessel coronary disease with mild disease in the proximal LAD and circumflex arteries as well as a calcium score 107, 80th percentile for age and sex matched control.  Unfortunately she has been intolerant of statins causing significant myalgias.  She was seen and treated by my pharmacist team in the lipid clinic who placed her on bempedoic acid.  She reports compliance with this and was also placed on Praluent 75 mg every 2 weeks.  Despite this, her lipids in April 2020 showed total cholesterol 264, triglycerides 87, HDL 66 and LDL 181.  2 years prior to that her LDL had been 116.  More recently her PCP drew lipids which showed total cholesterol 222, HDL 68, LDL 140 and triglycerides 81.  Hemoglobin A1c was 6.4.  Her target LDL is less than 70.  05/19/2020  Renee [name] is seen today in follow-up.  She has had significant additional reduction in her cholesterol.  Labs were drawn a couple days ago and we receive them today.  Her total cholesterol is now 158, triglycerides 48, HDL 63 and an LDL of 85 (down from 140).  Overall she seems to be tolerating this combination of medications very well.  She is not quite at target LDL less than 70, I do think we will achieve that soon.  At this time she does not want to add or change any medications.  PMHx:  Past Medical History:  Diagnosis Date  . Diabetes  mellitus without complication (HCC)   . HSV infection   . Hyperlipidemia   . Hypertension     Past Surgical History:  Procedure Laterality Date  . TOE SURGERY  1996  . TUBAL LIGATION    . WISDOM TOOTH EXTRACTION      FAMHx:  Family History  Problem Relation Age of Onset  . Hyperlipidemia Mother   . Hypertension Mother   . Lung cancer Father   . Cancer Father   . Breast cancer Sister   . Cancer Sister   . Breast cancer Maternal Aunt   . Cancer Maternal Aunt   . Hyperlipidemia Maternal Grandmother   . Hypertension Maternal Grandmother   . Colon cancer Paternal Uncle   . Cancer Paternal Uncle   . Colon cancer Paternal Uncle   . Diabetes Daughter     SOCHx:   reports that she has never smoked. She has never used smokeless tobacco. She reports current alcohol use. She reports that she does not use drugs.  ALLERGIES:  Allergies  Allergen Reactions  . Codeine Nausea Only    ROS: Pertinent items noted in HPI and remainder of comprehensive ROS otherwise negative.  HOME MEDS: Current Outpatient Medications on File Prior to Visit  Medication Sig Dispense Refill  . Alirocumab (PRALUENT) 150 MG/ML SOAJ Inject 1 Dose into the skin every 14 (fourteen) days. 2 mL 10  . amLODipine (NORVASC) 5 MG tablet Take 1 tablet (  5 mg total) by mouth daily. Keep follow up visit to receive further refills. Thank you. 30 tablet 0  . aspirin 81 MG tablet Take 81 mg by mouth daily.    Marland Kitchen LUMIGAN 0.01 % SOLN Place 1 drop into both eyes every evening.    . metFORMIN (GLUCOPHAGE) 500 MG tablet Take 1,000 mg by mouth daily with breakfast.     . NEXLETOL 180 MG TABS TAKE 1 TABLET EVERY DAY 90 tablet 3  . valACYclovir (VALTREX) 500 MG tablet Take 200 mg by mouth as needed.    . valsartan-hydrochlorothiazide (DIOVAN-HCT) 320-25 MG tablet Take 1 tablet by mouth daily. Keep follow up visit to receive further refills. Thank you. 30 tablet 0   No current facility-administered medications on file prior to  visit.    LABS/IMAGING: No results found for this or any previous visit (from the past 48 hour(s)). No results found.  LIPID PANEL:    Component Value Date/Time   CHOL 264 (H) 10/02/2018 0801   CHOL 346 (H) 06/17/2015 1431   TRIG 87 10/02/2018 0801   TRIG 89 06/17/2015 1431   HDL 66 10/02/2018 0801   HDL 71 06/17/2015 1431   CHOLHDL 4.0 10/02/2018 0801   CHOLHDL 4.9 06/17/2015 1431   LDLCALC 181 (H) 10/02/2018 0801   LDLCALC 257 (H) 06/17/2015 1431    WEIGHTS: Wt Readings from Last 3 Encounters:  05/19/20 180 lb (81.6 kg)  02/19/20 175 lb 12.8 oz (79.7 kg)  12/17/19 176 lb 3.2 oz (79.9 kg)    VITALS: BP (!) 147/82   Pulse 78   Ht 5' 4.5" (1.638 m)   Wt 180 lb (81.6 kg)   SpO2 100%   BMI 30.42 kg/m   EXAM: Deferred  EKG: Deferred  ASSESSMENT: 1. Probable familial hyperlipidemia 2. Two-vessel coronary disease with calcium score of 107, 80th percentile for age and sex matched control 3. Family history of dyslipidemia and heart disease 4. Statin intolerance-myalgias  PLAN: 1.   Renee [name] has had marked improvement in her lipids with much greater than 50% reduction on combination of bempedoic acid and Praluent.  She seems to be tolerating this.  LDL now in the 80s.  I think some further activity and dietary changes may help her get even lower.  I would recommend retesting it in 6 months.  We could possibly consider other options at that time however she does not want any additional therapies presently.  Follow-up in 6 months.  Chrystie Nose, MD, [facility], FACP  [place]  [facility]  Medical Director of the Advanced Lipid Disorders &  Cardiovascular Risk Reduction Clinic Diplomate of the American Board of Clinical Lipidology Attending Cardiologist  Direct Dial: [phone]  Fax: [phone]  Website:  [email or web address] 05/19/2020, 9:43 AM

## 2020-05-31 ENCOUNTER — Encounter: Payer: Self-pay | Admitting: Internal Medicine

## 2020-06-08 ENCOUNTER — Other Ambulatory Visit: Payer: Self-pay | Admitting: Interventional Cardiology

## 2020-06-22 ENCOUNTER — Other Ambulatory Visit: Payer: Self-pay | Admitting: Interventional Cardiology

## 2020-07-09 DIAGNOSIS — K921 Melena: Secondary | ICD-10-CM | POA: Diagnosis not present

## 2020-07-13 DIAGNOSIS — E1165 Type 2 diabetes mellitus with hyperglycemia: Secondary | ICD-10-CM | POA: Diagnosis not present

## 2020-07-13 DIAGNOSIS — Z7984 Long term (current) use of oral hypoglycemic drugs: Secondary | ICD-10-CM | POA: Diagnosis not present

## 2020-07-23 DIAGNOSIS — K921 Melena: Secondary | ICD-10-CM | POA: Diagnosis not present

## 2020-08-07 ENCOUNTER — Other Ambulatory Visit: Payer: Self-pay | Admitting: Interventional Cardiology

## 2020-08-17 NOTE — Progress Notes (Signed)
   Covid-19 Vaccination Clinic  Name:  Renee [name]    MRN: [id number] DOB: 1950/08/14  08/17/2020  [name] was observed post Covid-19 immunization for 15 minutes without incident. She was provided with Vaccine Information Sheet and instruction to access the V-Safe system.   [name] was instructed to call 911 with any severe reactions post vaccine: Marland Kitchen Difficulty breathing  . Swelling of face and throat  . A fast heartbeat  . A bad rash all over body  . Dizziness and weakness   Immunizations Administered    Name Date Dose VIS Date Route   Moderna Covid-19 Booster Vaccine 05/03/2020 10:15 AM 0.25 mL 03/31/2020 Intramuscular   Manufacturer: Moderna   Lot: [id number]   NDC: 20355-974-16

## 2020-08-31 DIAGNOSIS — Z7984 Long term (current) use of oral hypoglycemic drugs: Secondary | ICD-10-CM | POA: Diagnosis not present

## 2020-08-31 DIAGNOSIS — Z713 Dietary counseling and surveillance: Secondary | ICD-10-CM | POA: Diagnosis not present

## 2020-08-31 DIAGNOSIS — E1165 Type 2 diabetes mellitus with hyperglycemia: Secondary | ICD-10-CM | POA: Diagnosis not present

## 2020-10-08 DIAGNOSIS — I10 Essential (primary) hypertension: Secondary | ICD-10-CM | POA: Diagnosis not present

## 2020-10-08 DIAGNOSIS — Z7984 Long term (current) use of oral hypoglycemic drugs: Secondary | ICD-10-CM | POA: Diagnosis not present

## 2020-10-08 DIAGNOSIS — E119 Type 2 diabetes mellitus without complications: Secondary | ICD-10-CM | POA: Diagnosis not present

## 2020-10-08 DIAGNOSIS — N951 Menopausal and female climacteric states: Secondary | ICD-10-CM | POA: Diagnosis not present

## 2020-10-14 DIAGNOSIS — I10 Essential (primary) hypertension: Secondary | ICD-10-CM | POA: Diagnosis not present

## 2020-10-14 DIAGNOSIS — E119 Type 2 diabetes mellitus without complications: Secondary | ICD-10-CM | POA: Diagnosis not present

## 2020-10-14 DIAGNOSIS — Z7984 Long term (current) use of oral hypoglycemic drugs: Secondary | ICD-10-CM | POA: Diagnosis not present

## 2020-10-18 DIAGNOSIS — E1165 Type 2 diabetes mellitus with hyperglycemia: Secondary | ICD-10-CM | POA: Diagnosis not present

## 2020-11-03 ENCOUNTER — Other Ambulatory Visit: Payer: Self-pay

## 2020-11-03 ENCOUNTER — Other Ambulatory Visit: Payer: Medicare Other | Admitting: *Deleted

## 2020-11-03 DIAGNOSIS — E7801 Familial hypercholesterolemia: Secondary | ICD-10-CM | POA: Diagnosis not present

## 2020-11-05 LAB — LIPID PANEL
Chol/HDL Ratio: 2.6 ratio (ref 0.0–4.4)
Cholesterol, Total: 183 mg/dL (ref 100–199)
HDL: 71 mg/dL (ref >39)
LDL Chol Calc (NIH): 104 mg/dL — ABNORMAL HIGH (ref 0–99)
Triglycerides: 40 mg/dL (ref 0–149)
VLDL Cholesterol Cal: 8 mg/dL (ref 5–40)

## 2020-11-10 ENCOUNTER — Ambulatory Visit (INDEPENDENT_AMBULATORY_CARE_PROVIDER_SITE_OTHER): Payer: Medicare Other | Admitting: Internal Medicine

## 2020-11-10 ENCOUNTER — Encounter: Payer: Self-pay | Admitting: Internal Medicine

## 2020-11-10 ENCOUNTER — Other Ambulatory Visit: Payer: Self-pay

## 2020-11-10 VITALS — BP 130/78 | HR 74 | Ht 64.0 in | Wt 174.6 lb

## 2020-11-10 DIAGNOSIS — M791 Myalgia, unspecified site: Secondary | ICD-10-CM | POA: Diagnosis not present

## 2020-11-10 DIAGNOSIS — T466X5D Adverse effect of antihyperlipidemic and antiarteriosclerotic drugs, subsequent encounter: Secondary | ICD-10-CM | POA: Diagnosis not present

## 2020-11-10 DIAGNOSIS — E7801 Familial hypercholesterolemia: Secondary | ICD-10-CM | POA: Diagnosis not present

## 2020-11-10 DIAGNOSIS — Z124 Encounter for screening for malignant neoplasm of cervix: Secondary | ICD-10-CM | POA: Diagnosis not present

## 2020-11-10 DIAGNOSIS — E1159 Type 2 diabetes mellitus with other circulatory complications: Secondary | ICD-10-CM

## 2020-11-10 DIAGNOSIS — T466X5A Adverse effect of antihyperlipidemic and antiarteriosclerotic drugs, initial encounter: Secondary | ICD-10-CM

## 2020-11-10 DIAGNOSIS — Z01419 Encounter for gynecological examination (general) (routine) without abnormal findings: Secondary | ICD-10-CM | POA: Diagnosis not present

## 2020-11-10 DIAGNOSIS — Z6830 Body mass index (BMI) 30.0-30.9, adult: Secondary | ICD-10-CM | POA: Diagnosis not present

## 2020-11-10 DIAGNOSIS — I2510 Atherosclerotic heart disease of native coronary artery without angina pectoris: Secondary | ICD-10-CM | POA: Diagnosis not present

## 2020-11-10 DIAGNOSIS — I10 Essential (primary) hypertension: Secondary | ICD-10-CM

## 2020-11-10 NOTE — Patient Instructions (Signed)
Medication Instructions:  Your physician recommends that you continue on your current medications as directed. Please refer to the Current Medication list given to you today.  *If you need a refill on your cardiac medications before your next appointment, please call your pharmacy*   Lab Work: FASTING lab work in 6 months to check cholesterol  -- complete about 1 week before your next visit with Dr. Hilty   If you have labs (blood work) drawn today and your tests are completely normal, you will receive your results only by: . MyChart Message (if you have MyChart) OR . A paper copy in the mail If you have any lab test that is abnormal or we need to change your treatment, we will call you to review the results.   Testing/Procedures: NONE   Follow-Up: At [facility], you and your health needs are our priority.  As part of our continuing mission to provide you with exceptional heart care, we have created designated Provider Care Teams.  These Care Teams include your primary Cardiologist (physician) and Advanced Practice Providers (APPs -  Physician Assistants and Nurse Practitioners) who all work together to provide you with the care you need, when you need it.  We recommend signing up for the patient portal called "MyChart".  Sign up information is provided on this After Visit Summary.  MyChart is used to connect with patients for Virtual Visits (Telemedicine).  Patients are able to view lab/test results, encounter notes, upcoming appointments, etc.  Non-urgent messages can be sent to your provider as well.   To learn more about what you can do with MyChart, go to [email or web address].    Your next appointment:   6 month(s) - lipid clinic  The format for your next appointment:   In Person  Provider:   K. Chad Hilty, MD   Other Instructions   

## 2020-11-10 NOTE — Progress Notes (Signed)
LIPID CLINIC CONSULT NOTE  Chief Complaint:  Manage dyslipidemia  Primary Care Physician: [name], MD  Primary Cardiologist:  Lesleigh Noe, MD  HPI:  Renee [name] is a 70 y.o. female who is being seen today for the evaluation of dyslipidemia at the request of [name], MD.  This is a pleasant 70 year old female patient of Dr. Katrinka Blazing with a history of hypertension, longstanding dyslipidemia which has been difficult to treat as well as a family history of dyslipidemia and heart disease in her mother.  She underwent CT coronary angiography in May 2019 which showed two-vessel coronary disease with mild disease in the proximal LAD and circumflex arteries as well as a calcium score 107, 80th percentile for age and sex matched control.  Unfortunately she has been intolerant of statins causing significant myalgias.  She was seen and treated by my pharmacist team in the lipid clinic who placed her on bempedoic acid.  She reports compliance with this and was also placed on Praluent 75 mg every 2 weeks.  Despite this, her lipids in April 2020 showed total cholesterol 264, triglycerides 87, HDL 66 and LDL 181.  2 years prior to that her LDL had been 116.  More recently her PCP drew lipids which showed total cholesterol 222, HDL 68, LDL 140 and triglycerides 81.  Hemoglobin A1c was 6.4.  Her target LDL is less than 70.  05/19/2020  [name] is seen today in follow-up.  She has had significant additional reduction in her cholesterol.  Labs were drawn a couple days ago and we receive them today.  Her total cholesterol is now 158, triglycerides 48, HDL 63 and an LDL of 85 (down from 140).  Overall she seems to be tolerating this combination of medications very well.  She is not quite at target LDL less than 70, I do think we will achieve that soon.  At this time she does not want to add or change any medications.  11/10/2020  [name] returns today for follow-up.  She has been  more physically active, having joint [facility].  Despite this, her cholesterol numbers have actually slightly worsened.  Total cholesterol is now 183 up from 158, triglycerides are 140, HDL 71 and LDL of 104 up from 85.  She continues on the Praluent and Nexletol.  Overall she is tolerating the medications well.  She reports compliance with them.  Her A1c is also slightly higher at 6.3, previously at 6.1.  This does suggest a dietary component.  She admits that she may have relaxed her diet a little bit more since she had such significant reduction in her cholesterol with the Praluent.   PMHx:  Past Medical History:  Diagnosis Date  . Diabetes mellitus without complication (HCC)   . HSV infection   . Hyperlipidemia   . Hypertension     Past Surgical History:  Procedure Laterality Date  . TOE SURGERY  1996  . TUBAL LIGATION    . WISDOM TOOTH EXTRACTION      FAMHx:  Family History  Problem Relation Age of Onset  . Hyperlipidemia Mother   . Hypertension Mother   . Lung cancer Father   . Cancer Father   . Breast cancer Sister   . Cancer Sister   . Breast cancer Maternal Aunt   . Cancer Maternal Aunt   . Hyperlipidemia Maternal Grandmother   . Hypertension Maternal Grandmother   . Colon cancer Paternal Uncle   .  Cancer Paternal Uncle   . Colon cancer Paternal Uncle   . Diabetes Daughter     SOCHx:   reports that she has never smoked. She has never used smokeless tobacco. She reports current alcohol use. She reports that she does not use drugs.  ALLERGIES:  Allergies  Allergen Reactions  . Codeine Nausea Only    ROS: Pertinent items noted in HPI and remainder of comprehensive ROS otherwise negative.  HOME MEDS: Current Outpatient Medications on File Prior to Visit  Medication Sig Dispense Refill  . Alirocumab (PRALUENT) 150 MG/ML SOAJ Inject 1 Dose into the skin every 14 (fourteen) days. 2 mL 10  . amLODipine (NORVASC) 5 MG tablet TAKE 1  TABLET BY MOUTH EVERY DAY 30 tablet 6  . aspirin 81 MG tablet Take 81 mg by mouth daily.    Marland Kitchen LUMIGAN 0.01 % SOLN Place 1 drop into both eyes every evening.    . metFORMIN (GLUCOPHAGE) 500 MG tablet Take 1,000 mg by mouth daily with breakfast.     . NEXLETOL 180 MG TABS TAKE 1 TABLET EVERY DAY 90 tablet 1  . valACYclovir (VALTREX) 500 MG tablet Take 200 mg by mouth as needed.    . valsartan-hydrochlorothiazide (DIOVAN-HCT) 320-25 MG tablet TAKE 1 TABLET BY MOUTH EVERY DAY 90 tablet 3   No current facility-administered medications on file prior to visit.    LABS/IMAGING: No results found for this or any previous visit (from the past 48 hour(s)). No results found.  LIPID PANEL:    Component Value Date/Time   CHOL 183 11/03/2020 0000   CHOL 346 (H) 06/17/2015 1431   TRIG 40 11/03/2020 0000   TRIG 89 06/17/2015 1431   HDL 71 11/03/2020 0000   HDL 71 06/17/2015 1431   CHOLHDL 2.6 11/03/2020 0000   CHOLHDL 4.9 06/17/2015 1431   LDLCALC 104 (H) 11/03/2020 0000   LDLCALC 257 (H) 06/17/2015 1431    WEIGHTS: Wt Readings from Last 3 Encounters:  11/10/20 174 lb 9.6 oz (79.2 kg)  05/19/20 180 lb (81.6 kg)  02/19/20 175 lb 12.8 oz (79.7 kg)    VITALS: BP 130/78 (BP Location: Right Arm, Patient Position: Sitting, Cuff Size: Normal)   Pulse 74   Ht 5\' 4"  (1.626 m)   Wt 174 lb 9.6 oz (79.2 kg)   BMI 29.97 kg/m   EXAM: Deferred  EKG: Deferred  ASSESSMENT: 1. Probable familial hyperlipidemia 2. Two-vessel coronary disease with calcium score of 107, 80th percentile for age and sex matched control 3. Family history of dyslipidemia and heart disease 4. Statin intolerance-myalgias  PLAN: 1.   Ms. Scipio unfortunately has had an increase in her cholesterol.  I was hopeful with her increased exercise and some weight loss which she has had that her cholesterol numbers would be lower.  She overall says she feels very well.  I would continue to do what she is doing but we did  identify few things in her diet such as at higher intake of eggs and bacon and other saturated fats.  I advised her to try to cut back's fats in her diet to about 20% of calories with about 20 to 30% of calories from carbohydrates and the additional from proteins.  Follow-up in 6 months.  [name], MD, [facility], FACP  [place]  [facility]  Medical Director of the Advanced Lipid Disorders &  Cardiovascular Risk Reduction Clinic Diplomate of the American Board of Clinical Lipidology Attending Cardiologist  Direct Dial: [phone]  Fax: [phone]  Website:  [email or web address] 11/10/2020, 9:31 AM

## 2020-11-22 ENCOUNTER — Other Ambulatory Visit: Payer: Self-pay | Admitting: Internal Medicine

## 2020-11-22 ENCOUNTER — Other Ambulatory Visit: Payer: Self-pay | Admitting: Interventional Cardiology

## 2020-11-22 DIAGNOSIS — E7801 Familial hypercholesterolemia: Secondary | ICD-10-CM

## 2020-11-22 NOTE — Telephone Encounter (Signed)
Outpatient Medication Detail   Disp Refills Start End   amLODipine (NORVASC) 5 MG tablet 30 tablet 6 08/09/2020    Sig: TAKE 1 TABLET BY MOUTH EVERY DAY   Sent to pharmacy as: amLODipine (NORVASC) 5 MG tablet   E-Prescribing Status: Receipt confirmed by pharmacy (08/09/2020 12:14 PM EST)     Pharmacy  [id number] - [place], [place] - [address] [facility] [address]

## 2020-12-16 DIAGNOSIS — M9903 Segmental and somatic dysfunction of lumbar region: Secondary | ICD-10-CM | POA: Diagnosis not present

## 2020-12-16 DIAGNOSIS — M9905 Segmental and somatic dysfunction of pelvic region: Secondary | ICD-10-CM | POA: Diagnosis not present

## 2020-12-16 DIAGNOSIS — M7041 Prepatellar bursitis, right knee: Secondary | ICD-10-CM | POA: Diagnosis not present

## 2020-12-16 DIAGNOSIS — M9902 Segmental and somatic dysfunction of thoracic region: Secondary | ICD-10-CM | POA: Diagnosis not present

## 2020-12-17 DIAGNOSIS — M7041 Prepatellar bursitis, right knee: Secondary | ICD-10-CM | POA: Diagnosis not present

## 2020-12-17 DIAGNOSIS — M9902 Segmental and somatic dysfunction of thoracic region: Secondary | ICD-10-CM | POA: Diagnosis not present

## 2020-12-17 DIAGNOSIS — M9905 Segmental and somatic dysfunction of pelvic region: Secondary | ICD-10-CM | POA: Diagnosis not present

## 2020-12-17 DIAGNOSIS — M9903 Segmental and somatic dysfunction of lumbar region: Secondary | ICD-10-CM | POA: Diagnosis not present

## 2021-01-25 ENCOUNTER — Other Ambulatory Visit: Payer: Self-pay | Admitting: Obstetrics and Gynecology

## 2021-01-25 DIAGNOSIS — Z1231 Encounter for screening mammogram for malignant neoplasm of breast: Secondary | ICD-10-CM

## 2021-02-01 DIAGNOSIS — M9903 Segmental and somatic dysfunction of lumbar region: Secondary | ICD-10-CM | POA: Diagnosis not present

## 2021-02-01 DIAGNOSIS — M9902 Segmental and somatic dysfunction of thoracic region: Secondary | ICD-10-CM | POA: Diagnosis not present

## 2021-02-01 DIAGNOSIS — M9905 Segmental and somatic dysfunction of pelvic region: Secondary | ICD-10-CM | POA: Diagnosis not present

## 2021-02-01 DIAGNOSIS — M7041 Prepatellar bursitis, right knee: Secondary | ICD-10-CM | POA: Diagnosis not present

## 2021-02-02 DIAGNOSIS — H43391 Other vitreous opacities, right eye: Secondary | ICD-10-CM | POA: Diagnosis not present

## 2021-02-02 DIAGNOSIS — H35033 Hypertensive retinopathy, bilateral: Secondary | ICD-10-CM | POA: Diagnosis not present

## 2021-02-02 DIAGNOSIS — H401134 Primary open-angle glaucoma, bilateral, indeterminate stage: Secondary | ICD-10-CM | POA: Diagnosis not present

## 2021-02-02 DIAGNOSIS — H2513 Age-related nuclear cataract, bilateral: Secondary | ICD-10-CM | POA: Diagnosis not present

## 2021-02-08 ENCOUNTER — Ambulatory Visit
Admission: RE | Admit: 2021-02-08 | Discharge: 2021-02-08 | Disposition: A | Discharge status: Home / Self Care | Payer: Medicare Other | Source: Ambulatory Visit | Attending: Obstetrics and Gynecology | Admitting: Obstetrics and Gynecology

## 2021-02-08 ENCOUNTER — Other Ambulatory Visit: Payer: Self-pay

## 2021-02-08 DIAGNOSIS — Z7984 Long term (current) use of oral hypoglycemic drugs: Secondary | ICD-10-CM | POA: Diagnosis not present

## 2021-02-08 DIAGNOSIS — Z1231 Encounter for screening mammogram for malignant neoplasm of breast: Secondary | ICD-10-CM | POA: Diagnosis not present

## 2021-02-08 DIAGNOSIS — E119 Type 2 diabetes mellitus without complications: Secondary | ICD-10-CM | POA: Diagnosis not present

## 2021-02-08 DIAGNOSIS — I10 Essential (primary) hypertension: Secondary | ICD-10-CM | POA: Diagnosis not present

## 2021-02-11 ENCOUNTER — Other Ambulatory Visit: Payer: Self-pay | Admitting: Interventional Cardiology

## 2021-02-22 DIAGNOSIS — M7041 Prepatellar bursitis, right knee: Secondary | ICD-10-CM | POA: Diagnosis not present

## 2021-02-22 DIAGNOSIS — M9902 Segmental and somatic dysfunction of thoracic region: Secondary | ICD-10-CM | POA: Diagnosis not present

## 2021-02-22 DIAGNOSIS — M9903 Segmental and somatic dysfunction of lumbar region: Secondary | ICD-10-CM | POA: Diagnosis not present

## 2021-02-22 DIAGNOSIS — M9905 Segmental and somatic dysfunction of pelvic region: Secondary | ICD-10-CM | POA: Diagnosis not present

## 2021-03-03 ENCOUNTER — Other Ambulatory Visit: Payer: Self-pay | Admitting: Internal Medicine

## 2021-03-03 DIAGNOSIS — E7801 Familial hypercholesterolemia: Secondary | ICD-10-CM

## 2021-03-17 ENCOUNTER — Ambulatory Visit: Payer: Medicare Other | Attending: Internal Medicine

## 2021-03-17 ENCOUNTER — Other Ambulatory Visit (HOSPITAL_BASED_OUTPATIENT_CLINIC_OR_DEPARTMENT_OTHER): Payer: Self-pay

## 2021-03-17 DIAGNOSIS — Z23 Encounter for immunization: Secondary | ICD-10-CM

## 2021-03-17 MED ORDER — PFIZER COVID-19 VAC BIVALENT 30 MCG/0.3ML IM SUSP
INTRAMUSCULAR | 0 refills | Status: DC
  Filled 2021-03-17: qty 0.3, 1d supply, fill #0

## 2021-03-17 NOTE — Progress Notes (Signed)
   Covid-19 Vaccination Clinic  Name:  Renee [name]    MRN: [id number] DOB: [birth year]  03/17/2021  Renee [name] was observed post Covid-19 immunization for 15 minutes without incident. She was provided with Vaccine Information Sheet and instruction to access the V-Safe system.   Renee [name] was instructed to call 911 with any severe reactions post vaccine: Difficulty breathing  Swelling of face and throat  A fast heartbeat  A bad rash all over body  Dizziness and weakness

## 2021-03-24 ENCOUNTER — Other Ambulatory Visit: Payer: Self-pay | Admitting: Interventional Cardiology

## 2021-04-27 DIAGNOSIS — E119 Type 2 diabetes mellitus without complications: Secondary | ICD-10-CM | POA: Diagnosis not present

## 2021-04-27 DIAGNOSIS — M545 Low back pain, unspecified: Secondary | ICD-10-CM | POA: Diagnosis not present

## 2021-04-27 DIAGNOSIS — N951 Menopausal and female climacteric states: Secondary | ICD-10-CM | POA: Diagnosis not present

## 2021-04-27 DIAGNOSIS — I10 Essential (primary) hypertension: Secondary | ICD-10-CM | POA: Diagnosis not present

## 2021-04-27 DIAGNOSIS — R350 Frequency of micturition: Secondary | ICD-10-CM | POA: Diagnosis not present

## 2021-04-27 DIAGNOSIS — G8929 Other chronic pain: Secondary | ICD-10-CM | POA: Diagnosis not present

## 2021-04-27 DIAGNOSIS — Z7984 Long term (current) use of oral hypoglycemic drugs: Secondary | ICD-10-CM | POA: Diagnosis not present

## 2021-05-17 DIAGNOSIS — E119 Type 2 diabetes mellitus without complications: Secondary | ICD-10-CM | POA: Diagnosis not present

## 2021-05-17 DIAGNOSIS — Z7985 Long-term (current) use of injectable non-insulin antidiabetic drugs: Secondary | ICD-10-CM | POA: Diagnosis not present

## 2021-05-17 NOTE — Progress Notes (Signed)
[name] [address] [place] [address] Phone: [phone] Subjective:   Bruce Donath, am serving as a scribe for Dr. Antoine Primas.  This visit occurred during the SARS-CoV-2 public health emergency.  Safety protocols were in place, including screening questions prior to the visit, additional usage of staff PPE, and extensive cleaning of exam room while observing appropriate contact time as indicated for disinfecting solutions.    I'm seeing this patient by the request  of:  [name], MD  CC: Low back pain, knee pain  FIE:[id number]  Renee [name] is a 70 y.o. female coming in with complaint of LBP for past 3 months. Patient started caring for child that is 20#. Pain with lumbar flexion. Notices pain with walking as well. Denies any radiating symptoms. Patient does not do anything for pain relief.  Patient has seen a chiropractor previously for her back. Tenderness seems to help and sometimes things can worsen.  He tries to avoid different medications when possible.       Past Medical History:  Diagnosis Date   Diabetes mellitus without complication (HCC)    HSV infection    Hyperlipidemia    Hypertension    Past Surgical History:  Procedure Laterality Date   TOE SURGERY  1996   TUBAL LIGATION     WISDOM TOOTH EXTRACTION     Social History   Socioeconomic History   Marital status: Divorced    Spouse name: Not on file   Number of children: 2   Years of education: 16+   Highest education level: Not on file  Occupational History   Not on file  Tobacco Use   Smoking status: Never   Smokeless tobacco: Never  Substance and Sexual Activity   Alcohol use: Yes    Alcohol/week: 0.0 standard drinks    Comment: Wine occass   Drug use: No   Sexual activity: Never    Comment: BTL   Other Topics Concern   Not on file  Social History Narrative   Lives at home with herself with dog.   Caffeine use: Drink 2 cups coffee  per day   Social Determinants of Health   Financial Resource Strain: Not on file  Food Insecurity: Not on file  Transportation Needs: Not on file  Physical Activity: Not on file  Stress: Not on file  Social Connections: Not on file   Allergies  Allergen Reactions   Codeine Nausea Only   Family History  Problem Relation Age of Onset   Hyperlipidemia Mother    Hypertension Mother    Lung cancer Father    Cancer Father    Breast cancer Sister    Cancer Sister    Breast cancer Maternal Aunt    Cancer Maternal Aunt    Hyperlipidemia Maternal Grandmother    Hypertension Maternal Grandmother    Colon cancer Paternal Uncle    Cancer Paternal Uncle    Colon cancer Paternal Uncle    Diabetes Daughter     Current Outpatient Medications (Endocrine & Metabolic):    metFORMIN (GLUCOPHAGE) 500 MG tablet, Take 1,000 mg by mouth daily with breakfast.   Current Outpatient Medications (Cardiovascular):    amLODipine (NORVASC) 5 MG tablet, TAKE 1 TABLET BY MOUTH EVERY DAY   Bempedoic Acid (NEXLETOL) 180 MG TABS, Take 1 tablet by mouth daily. Please keep upcoming appt in February 2023 with Dr. Katrinka Blazing before anymore refills.   PRALUENT 150 MG/ML SOAJ, INJECT 1 DOSE INTO THE  SKIN EVERY 14 DAYS.   valsartan-hydrochlorothiazide (DIOVAN-HCT) 320-25 MG tablet, TAKE 1 TABLET BY MOUTH EVERY DAY   Current Outpatient Medications (Analgesics):    aspirin 81 MG tablet, Take 81 mg by mouth daily.   Current Outpatient Medications (Other):    COVID-19 mRNA bivalent vaccine, Pfizer, (PFIZER COVID-19 VAC BIVALENT) injection, Inject into the muscle.   LUMIGAN 0.01 % SOLN, Place 1 drop into both eyes every evening.   valACYclovir (VALTREX) 500 MG tablet, Take 200 mg by mouth as needed.   Vitamin D, Ergocalciferol, (DRISDOL) 1.25 MG (50000 UNIT) CAPS capsule, Take 1 capsule (50,000 Units total) by mouth every 7 (seven) days.   Reviewed prior external information including notes and imaging from  primary  care provider As well as notes that were available from care everywhere and other healthcare systems.  Past medical history, social, surgical and family history all reviewed in electronic medical record.  No pertanent information unless stated regarding to the chief complaint.   Review of Systems:  No headache, visual changes, nausea, vomiting, diarrhea, constipation, dizziness, abdominal pain, skin rash, fevers, chills, night sweats, weight loss, swollen lymph nodes, body aches, joint swelling, chest pain, shortness of breath, mood changes. POSITIVE muscle aches  Objective  Blood pressure 128/74, pulse 81, height 5\' 4"  (1.626 m), weight 175 lb (79.4 kg), SpO2 99 %.   General: No apparent distress alert and oriented x3 mood and affect normal, dressed appropriately.  HEENT: Pupils equal, extraocular movements intact  Respiratory: Patient's speak in full sentences and does not appear short of breath  Cardiovascular: No lower extremity edema, non tender, no erythema  Gait normal with good balance and coordination.  MSK: Low back exam does have loss of lordosis.  Patient does have significant tightness noted on the right side.  Positive Faber on the right.  Negative straight leg test. Knee exam shows the patient does have some mild crepitus bilaterally.  No significant instability noted.  Osteopathic findings L5 flexed rotated and side bent left Sacrum right on right    Impression and Recommendations:     The above documentation has been reviewed and is accurate and complete , DO

## 2021-05-18 ENCOUNTER — Ambulatory Visit (INDEPENDENT_AMBULATORY_CARE_PROVIDER_SITE_OTHER): Payer: Medicare Other

## 2021-05-18 ENCOUNTER — Ambulatory Visit (INDEPENDENT_AMBULATORY_CARE_PROVIDER_SITE_OTHER): Payer: Medicare Other | Admitting: Family Medicine

## 2021-05-18 ENCOUNTER — Other Ambulatory Visit: Payer: Self-pay

## 2021-05-18 VITALS — BP 128/74 | HR 81 | Ht 64.0 in | Wt 175.0 lb

## 2021-05-18 DIAGNOSIS — I2510 Atherosclerotic heart disease of native coronary artery without angina pectoris: Secondary | ICD-10-CM | POA: Diagnosis not present

## 2021-05-18 DIAGNOSIS — M25562 Pain in left knee: Secondary | ICD-10-CM | POA: Diagnosis not present

## 2021-05-18 DIAGNOSIS — M545 Low back pain, unspecified: Secondary | ICD-10-CM | POA: Insufficient documentation

## 2021-05-18 DIAGNOSIS — Z6830 Body mass index (BMI) 30.0-30.9, adult: Secondary | ICD-10-CM | POA: Diagnosis not present

## 2021-05-18 DIAGNOSIS — E669 Obesity, unspecified: Secondary | ICD-10-CM

## 2021-05-18 DIAGNOSIS — E559 Vitamin D deficiency, unspecified: Secondary | ICD-10-CM

## 2021-05-18 DIAGNOSIS — G8929 Other chronic pain: Secondary | ICD-10-CM

## 2021-05-18 DIAGNOSIS — M9904 Segmental and somatic dysfunction of sacral region: Secondary | ICD-10-CM | POA: Diagnosis not present

## 2021-05-18 DIAGNOSIS — M25561 Pain in right knee: Secondary | ICD-10-CM

## 2021-05-18 MED ORDER — VITAMIN D (ERGOCALCIFEROL) 1.25 MG (50000 UNIT) PO CAPS: 50000.0000 [IU] | ORAL_CAPSULE | ORAL | 0 refills | Status: DC

## 2021-05-18 NOTE — Assessment & Plan Note (Signed)
   Decision today to treat with OMT was based on Physical Exam  After verbal consent patient was treated with HVLA, ME, FPR techniques in  lumbar and sacral areas, all areas are chronic   Patient tolerated the procedure well with improvement in symptoms  Patient given exercises, stretches and lifestyle modifications  See medications in patient instructions if given  Patient will follow up in 4-8 weeks 

## 2021-05-18 NOTE — Assessment & Plan Note (Signed)
Mild overall but I do think patient could do relatively well.  Patient is very motivated.  Will refer to healthy weight and wellness.  Encourage patient to continue to try to workout and low impact at this moment likely secondary to the knee and back pain.

## 2021-05-18 NOTE — Assessment & Plan Note (Signed)
Low back pain overall.  Discussed with patient icing regimen and home exercises.  Patient will respond well to conservative therapy.  Attempted osteopathic manipulation.  Increase activity slowly.  Follow-up again in 6 to 8 weeks.  Discussed with patient's labs will do a vitamin D supplementation.

## 2021-05-18 NOTE — Patient Instructions (Addendum)
Xray today Once weekly Vit D Tried manipulation Tart Cherry Extract 1200mg  at night Wear good shoes Weight management will call you See me in 6 weeks

## 2021-05-18 NOTE — Assessment & Plan Note (Signed)
Prescription dose given with a value of 15.9 1 to recheck at the end of 3 months

## 2021-05-18 NOTE — Assessment & Plan Note (Signed)
Likely mild arthritic changes noted.  We will get x-rays today.  Do believe weight loss would be beneficial.  Patient wants to be active and we discussed low impact exercises.  Continuing to have pain consider injections.

## 2021-05-19 DIAGNOSIS — E119 Type 2 diabetes mellitus without complications: Secondary | ICD-10-CM | POA: Diagnosis not present

## 2021-06-08 ENCOUNTER — Telehealth: Payer: Self-pay

## 2021-06-08 NOTE — Telephone Encounter (Signed)
PA for Praluent submitted via CMM, Key: [id number].

## 2021-06-09 ENCOUNTER — Other Ambulatory Visit: Payer: Self-pay

## 2021-06-09 ENCOUNTER — Other Ambulatory Visit: Payer: Medicare Other | Admitting: *Deleted

## 2021-06-09 DIAGNOSIS — E7849 Other hyperlipidemia: Secondary | ICD-10-CM

## 2021-06-09 LAB — LIPID PANEL
Chol/HDL Ratio: 2.2 ratio (ref 0.0–4.4)
Cholesterol, Total: 139 mg/dL (ref 100–199)
HDL: 62 mg/dL (ref >39)
LDL Chol Calc (NIH): 67 mg/dL (ref 0–99)
Triglycerides: 41 mg/dL (ref 0–149)
VLDL Cholesterol Cal: 10 mg/dL (ref 5–40)

## 2021-06-09 NOTE — Progress Notes (Signed)
Per Mellody Dance with lab he is unable to see order for pt's lipid draw. Order re-placed for lipid panel as originally requested by Dr Rennis Golden.

## 2021-06-14 NOTE — Telephone Encounter (Signed)
No additional PA is required. For further inquiries please contact the number on the back of the member prescription card

## 2021-06-15 ENCOUNTER — Ambulatory Visit: Payer: BC Managed Care – PPO | Admitting: Family Medicine

## 2021-06-15 ENCOUNTER — Encounter (HOSPITAL_BASED_OUTPATIENT_CLINIC_OR_DEPARTMENT_OTHER): Payer: Self-pay | Admitting: Internal Medicine

## 2021-06-15 ENCOUNTER — Ambulatory Visit (INDEPENDENT_AMBULATORY_CARE_PROVIDER_SITE_OTHER): Payer: Medicare PPO | Admitting: Internal Medicine

## 2021-06-15 ENCOUNTER — Other Ambulatory Visit: Payer: Self-pay

## 2021-06-15 VITALS — BP 122/80 | HR 78 | Ht 64.0 in | Wt 175.3 lb

## 2021-06-15 DIAGNOSIS — E1159 Type 2 diabetes mellitus with other circulatory complications: Secondary | ICD-10-CM

## 2021-06-15 DIAGNOSIS — E7801 Familial hypercholesterolemia: Secondary | ICD-10-CM

## 2021-06-15 DIAGNOSIS — I2510 Atherosclerotic heart disease of native coronary artery without angina pectoris: Secondary | ICD-10-CM | POA: Diagnosis not present

## 2021-06-15 DIAGNOSIS — M791 Myalgia, unspecified site: Secondary | ICD-10-CM | POA: Diagnosis not present

## 2021-06-15 DIAGNOSIS — T466X5D Adverse effect of antihyperlipidemic and antiarteriosclerotic drugs, subsequent encounter: Secondary | ICD-10-CM

## 2021-06-15 MED ORDER — NEXLETOL 180 MG PO TABS: 1.0000 | ORAL_TABLET | Freq: Every day | ORAL | 3 refills | Status: DC

## 2021-06-15 NOTE — Progress Notes (Signed)
LIPID CLINIC CONSULT NOTE  Chief Complaint:  Follow-up dyslipidemia  Primary Care Physician: Henrine Screwshacker, Robert, MD  Primary Cardiologist:  Lesleigh NoeHenry W Smith III, MD  HPI:  Georgana CurioGwendolyn Hazen is a 71 y.o. female who is being seen today for the evaluation of dyslipidemia at the request of Henrine Screwshacker, Robert, MD.  This is a pleasant 71 year old female patient of Dr. Katrinka BlazingSmith with a history of hypertension, longstanding dyslipidemia which has been difficult to treat as well as a family history of dyslipidemia and heart disease in her mother.  She underwent CT coronary angiography in May 2019 which showed two-vessel coronary disease with mild disease in the proximal LAD and circumflex arteries as well as a calcium score 107, 80th percentile for age and sex matched control.  Unfortunately she has been intolerant of statins causing significant myalgias.  She was seen and treated by my pharmacist team in the lipid clinic who placed her on bempedoic acid.  She reports compliance with this and was also placed on Praluent 75 mg every 2 weeks.  Despite this, her lipids in April 2020 showed total cholesterol 264, triglycerides 87, HDL 66 and LDL 181.  2 years prior to that her LDL had been 116.  More recently her PCP drew lipids which showed total cholesterol 222, HDL 68, LDL 140 and triglycerides 81.  Hemoglobin A1c was 6.4.  Her target LDL is less than 70.  05/19/2020  Ms. Cyndia DiverMcFadden is seen today in follow-up.  She has had significant additional reduction in her cholesterol.  Labs were drawn a couple days ago and we receive them today.  Her total cholesterol is now 158, triglycerides 48, HDL 63 and an LDL of 85 (down from 140).  Overall she seems to be tolerating this combination of medications very well.  She is not quite at target LDL less than 70, I do think we will achieve that soon.  At this time she does not want to add or change any medications.  11/10/2020  Ms. Cyndia DiverMcFadden returns today for follow-up.  She has  been more physically active, having joint [facility].  Despite this, her cholesterol numbers have actually slightly worsened.  Total cholesterol is now 183 up from 158, triglycerides are 140, HDL 71 and LDL of 104 up from 85.  She continues on the Praluent and Nexletol.  Overall she is tolerating the medications well.  She reports compliance with them.  Her A1c is also slightly higher at 6.3, previously at 6.1.  This does suggest a dietary component.  She admits that she may have relaxed her diet a little bit more since she had such significant reduction in her cholesterol with the Praluent.  06/15/2021  Ms. Cyndia DiverMcFadden returns today for follow-up.  She has been working hard exercising [facility].  She is also made dietary changes.  Her cholesterol is accordingly improved with total now 139, triglycerides 62, HDL 41 and LDL 67.  PMHx:  Past Medical History:  Diagnosis Date   Diabetes mellitus without complication (HCC)    HSV infection    Hyperlipidemia    Hypertension     Past Surgical History:  Procedure Laterality Date   TOE SURGERY  1996   TUBAL LIGATION     WISDOM TOOTH EXTRACTION      FAMHx:  Family History  Problem Relation Age of Onset   Hyperlipidemia Mother    Hypertension Mother    Lung cancer Father  Cancer Father    Breast cancer Sister    Cancer Sister    Breast cancer Maternal Aunt    Cancer Maternal Aunt    Hyperlipidemia Maternal Grandmother    Hypertension Maternal Grandmother    Colon cancer Paternal Uncle    Cancer Paternal Uncle    Colon cancer Paternal Uncle    Diabetes Daughter     SOCHx:   reports that she has never smoked. She has never used smokeless tobacco. She reports current alcohol use. She reports that she does not use drugs.  ALLERGIES:  Allergies  Allergen Reactions   Codeine Nausea Only    ROS: Pertinent items noted in HPI and remainder of comprehensive ROS otherwise  negative.  HOME MEDS: Current Outpatient Medications on File Prior to Visit  Medication Sig Dispense Refill   amLODipine (NORVASC) 5 MG tablet TAKE 1 TABLET BY MOUTH EVERY DAY 90 tablet 2   aspirin 81 MG tablet Take 81 mg by mouth daily.     Bempedoic Acid (NEXLETOL) 180 MG TABS Take 1 tablet by mouth daily. Please keep upcoming appt in February 2023 with Dr. Katrinka Blazing before anymore refills. 90 tablet 1   COVID-19 mRNA bivalent vaccine, Pfizer, (PFIZER COVID-19 VAC BIVALENT) injection Inject into the muscle. 0.3 mL 0   LUMIGAN 0.01 % SOLN Place 1 drop into both eyes every evening.     PRALUENT 150 MG/ML SOAJ INJECT 1 DOSE INTO THE SKIN EVERY 14 DAYS. 6 mL 3   Semaglutide (OZEMPIC, 0.25 OR 0.5 MG/DOSE, [place]) Titrate 0.25 mg once weekly for 4 weeks then 0.5 mg once weekly for 2 weeks     valACYclovir (VALTREX) 500 MG tablet Take 200 mg by mouth as needed.     valsartan-hydrochlorothiazide (DIOVAN-HCT) 320-25 MG tablet TAKE 1 TABLET BY MOUTH EVERY DAY 90 tablet 3   Vitamin D, Ergocalciferol, (DRISDOL) 1.25 MG (50000 UNIT) CAPS capsule Take 1 capsule (50,000 Units total) by mouth every 7 (seven) days. 12 capsule 0   XIIDRA 5 % SOLN Apply 1 drop to eye 2 (two) times daily.     metFORMIN (GLUCOPHAGE) 500 MG tablet Take 1,000 mg by mouth daily with breakfast.  (Patient not taking: Reported on 06/15/2021)     No current facility-administered medications on file prior to visit.    LABS/IMAGING: No results found for this or any previous visit (from the past 48 hour(s)). No results found.  LIPID PANEL:    Component Value Date/Time   CHOL 139 06/09/2021 0850   CHOL 346 (H) 06/17/2015 1431   TRIG 41 06/09/2021 0850   TRIG 89 06/17/2015 1431   HDL 62 06/09/2021 0850   HDL 71 06/17/2015 1431   CHOLHDL 2.2 06/09/2021 0850   CHOLHDL 4.9 06/17/2015 1431   LDLCALC 67 06/09/2021 0850   LDLCALC 257 (H) 06/17/2015 1431    WEIGHTS: Wt Readings from Last 3 Encounters:  06/15/21 175 lb 4.8 oz (79.5 kg)   05/18/21 175 lb (79.4 kg)  11/10/20 174 lb 9.6 oz (79.2 kg)    VITALS: BP 122/80    Pulse 78    Ht 5\' 4"  (1.626 m)    Wt 175 lb 4.8 oz (79.5 kg)    SpO2 99%    BMI 30.09 kg/m   EXAM: Deferred  EKG: Deferred  ASSESSMENT: Probable familial hyperlipidemia Two-vessel coronary disease with calcium score of 107, 80th percentile for age and sex matched control Family history of dyslipidemia and heart disease Statin intolerance-myalgias  PLAN: 1.  Ms. Burleigh has had significant improvement in her lipids down combination of Praluent and Nexletol.  Her LDL is at target less than 70 and she is tolerating medications without any significant side effects.  She continues to exercise but is plateaued somewhat with weight.  She is a little frustrated that being on the Ozempic she has not lost more weight.  I encouraged her to follow-up with her primary regarding this.  Plan follow-up with me annually or sooner as necessary.  Chrystie Nose, MD, [facility], FACP  [place]   [facility]  Medical Director of the Advanced Lipid Disorders &  Cardiovascular Risk Reduction Clinic Diplomate of the American Board of Clinical Lipidology Attending Cardiologist  Direct Dial: [phone]   Fax: [phone]  Website:  [email or web address] Nicely Kolby Myung 06/15/2021, 11:35 AM

## 2021-06-15 NOTE — Patient Instructions (Signed)
Medication Instructions:  Your physician recommends that you continue on your current medications as directed. Please refer to the Current Medication list given to you today.  *If you need a refill on your cardiac medications before your next appointment, please call your pharmacy*   Lab Work: FASTING lab work to check cholesterol 1 year -- complete about 1 week before next visit with Dr. Debara Pickett   If you have labs (blood work) drawn today and your tests are completely normal, you will receive your results only by: [place] (if you have MyChart) OR A paper copy in the mail If you have any lab test that is abnormal or we need to change your treatment, we will call you to review the results.   Testing/Procedures: NONE   Follow-Up: At [facility], you and your health needs are our priority.  As part of our continuing mission to provide you with exceptional heart care, we have created designated Provider Care Teams.  These Care Teams include your primary Cardiologist (physician) and Advanced Practice Providers (APPs -  Physician Assistants and Nurse Practitioners) who all work together to provide you with the care you need, when you need it.  We recommend signing up for the patient portal called "MyChart".  Sign up information is provided on this After Visit Summary.  MyChart is used to connect with patients for Virtual Visits (Telemedicine).  Patients are able to view lab/test results, encounter notes, upcoming appointments, etc.  Non-urgent messages can be sent to your provider as well.   To learn more about what you can do with MyChart, go to [email or web address].    Your next appointment:   12 months with Dr. Debara Pickett -- lipid clinic

## 2021-06-23 ENCOUNTER — Other Ambulatory Visit: Payer: BC Managed Care – PPO

## 2021-06-28 ENCOUNTER — Ambulatory Visit (HOSPITAL_BASED_OUTPATIENT_CLINIC_OR_DEPARTMENT_OTHER): Payer: BC Managed Care – PPO | Admitting: Internal Medicine

## 2021-06-28 NOTE — Progress Notes (Signed)
[place] Phone: [phone] Subjective:   Renee [name], am serving as a scribe for Dr. Hulan Saas.This visit occurred during the SARS-CoV-2 public health emergency.  Safety protocols were in place, including screening questions prior to the visit, additional usage of staff PPE, and extensive cleaning of exam room while observing appropriate contact time as indicated for disinfecting solutions.  I'm seeing this patient by the request  of:  Aura Dials, MD  CC: low back pain follow up and knee pain   [id number]  05/18/2021 Likely mild arthritic changes noted.  We will get x-rays today.  Do believe weight loss would be beneficial.  Patient wants to be active and we discussed low impact exercises.  Continuing to have pain consider injections.  Low back pain overall.  Discussed with patient icing regimen and home exercises.  Patient will respond well to conservative therapy.  Attempted osteopathic manipulation.  Increase activity slowly.  Follow-up again in 6 to 8 weeks.  Discussed with patient's labs will do a vitamin D supplementation.  Mild overall but I do think patient could do relatively well.  Patient is very motivated.  Will refer to healthy weight and wellness.  Encourage patient to continue to try to workout and low impact at this moment likely secondary to the knee and back pain.  OMT on 05/18/2021  Updated 06/29/2021 Renee [name] is a 71 y.o. female coming in with complaint of LBP. Patient states that she is doing better. Using Vit D and tart cherry extract and doing HEP. Patient notices when she carries heavy loads she will feel pain in her back.   Xray IMPRESSION: 1. Prominent lower lumbar spondylosis and facet hypertrophy. 2. Mild anterolisthesis of L5 on S1, without evidence of pars defect. 3. Pelvic calcification most consistent with calcified uterine fibroid.  IMPRESSION: 1. Mild left  knee osteoarthritis. 2. Unremarkable right knee.     Past Medical History:  Diagnosis Date   Diabetes mellitus without complication ([place])    HSV infection    Hyperlipidemia    Hypertension    Past Surgical History:  Procedure Laterality Date   TOE SURGERY  1996   TUBAL LIGATION     WISDOM TOOTH EXTRACTION     Social History   Socioeconomic History   Marital status: Divorced    Spouse name: Not on file   Number of children: 2   Years of education: 16+   Highest education level: Not on file  Occupational History   Not on file  Tobacco Use   Smoking status: Never   Smokeless tobacco: Never  Substance and Sexual Activity   Alcohol use: Yes    Alcohol/week: 0.0 standard drinks    Comment: Wine occass   Drug use: [name]   Sexual activity: Never    Comment: BTL   Other Topics Concern   Not on file  Social History Narrative   Lives at home with herself with dog.   Caffeine use: Drink 2 cups coffee per day   Social Determinants of Health   Financial Resource Strain: Not on file  Food Insecurity: Not on file  Transportation Needs: Not on file  Physical Activity: Not on file  Stress: Not on file  Social Connections: Not on file   Allergies  Allergen Reactions   Codeine Nausea Only   Family History  Problem Relation Age of Onset   Hyperlipidemia Mother    Hypertension Mother  Lung cancer Father    Cancer Father    Breast cancer Sister    Cancer Sister    Breast cancer Maternal Aunt    Cancer Maternal Aunt    Hyperlipidemia Maternal Grandmother    Hypertension Maternal Grandmother    Colon cancer Paternal Uncle    Cancer Paternal Uncle    Colon cancer Paternal Uncle    Diabetes Daughter     Current Outpatient Medications (Endocrine & Metabolic):    metFORMIN (GLUCOPHAGE) 500 MG tablet, Take 1,000 mg by mouth daily with breakfast.   Semaglutide (OZEMPIC, 0.25 OR 0.5 MG/DOSE, [place]), Titrate 0.25 mg once weekly for 4 weeks then 0.5 mg once weekly for 2  weeks  Current Outpatient Medications (Cardiovascular):    amLODipine (NORVASC) 5 MG tablet, TAKE 1 TABLET BY MOUTH EVERY DAY   Bempedoic Acid (NEXLETOL) 180 MG TABS, Take 1 tablet by mouth daily.   PRALUENT 150 MG/ML SOAJ, INJECT 1 DOSE INTO THE SKIN EVERY 14 DAYS.   valsartan-hydrochlorothiazide (DIOVAN-HCT) 320-25 MG tablet, TAKE 1 TABLET BY MOUTH EVERY DAY   Current Outpatient Medications (Analgesics):    aspirin 81 MG tablet, Take 81 mg by mouth daily.   Current Outpatient Medications (Other):    COVID-19 mRNA bivalent vaccine, Pfizer, (PFIZER COVID-19 VAC BIVALENT) injection, Inject into the muscle.   LUMIGAN 0.01 % SOLN, Place 1 drop into both eyes every evening.   valACYclovir (VALTREX) 500 MG tablet, Take 200 mg by mouth as needed.   Vitamin D, Ergocalciferol, (DRISDOL) 1.25 MG (50000 UNIT) CAPS capsule, Take 1 capsule (50,000 Units total) by mouth every 7 (seven) days.   XIIDRA 5 % SOLN, Apply 1 drop to eye 2 (two) times daily.   Reviewed prior external information including notes and imaging from  primary care provider As well as notes that were available from care everywhere and other healthcare systems.  Past medical history, social, surgical and family history all reviewed in electronic medical record.  [name] pertanent information unless stated regarding to the chief complaint.   Review of Systems:  [name] headache, visual changes, nausea, vomiting, diarrhea, constipation, dizziness, abdominal pain, skin rash, fevers, chills, night sweats, weight loss, swollen lymph nodes, body aches, joint swelling, chest pain, shortness of breath, mood changes. POSITIVE muscle aches  Objective  Blood pressure 106/76, pulse 81, height 5\' 4"  (1.626 m), weight 175 lb (79.4 kg), SpO2 99 %.   General: [name] apparent distress alert and oriented x3 mood and affect normal, dressed appropriately.  HEENT: Pupils equal, extraocular movements intact  Respiratory: Patient's speak in full sentences and  does not appear short of breath  Cardiovascular: [name] lower extremity edema, non tender, [name] erythema  Gait normal with good balance and coordination.  MSK: Low back exam does have some loss of lordosis.  Some tenderness to palpation of the paraspinal musculature.  Patient has some tightness noted with FABER test.  Sitting comfortably in the chair today.     Impression and Recommendations:     The above documentation has been reviewed and is accurate and complete Lyndal Pulley, DO

## 2021-06-29 ENCOUNTER — Other Ambulatory Visit: Payer: Self-pay

## 2021-06-29 ENCOUNTER — Other Ambulatory Visit: Payer: Self-pay | Admitting: Interventional Cardiology

## 2021-06-29 ENCOUNTER — Encounter: Payer: Self-pay | Admitting: Family Medicine

## 2021-06-29 ENCOUNTER — Ambulatory Visit (INDEPENDENT_AMBULATORY_CARE_PROVIDER_SITE_OTHER): Payer: Medicare PPO | Admitting: Family Medicine

## 2021-06-29 DIAGNOSIS — G8929 Other chronic pain: Secondary | ICD-10-CM | POA: Diagnosis not present

## 2021-06-29 DIAGNOSIS — M545 Low back pain, unspecified: Secondary | ICD-10-CM

## 2021-06-29 NOTE — Patient Instructions (Signed)
Doing much better Making progress Ok to do machine weights if back is stable Can row up to 574m 2x a week only Can walk as well up to 2x a week Continue vitamins See me again in 2 months

## 2021-06-30 NOTE — Assessment & Plan Note (Signed)
Today because patient is doing relatively well we will continue with the conservative therapy.  At follow-up consider osteopathic manipulation.  We discussed with patient about the arthritic changes.  Discussed how to increase activity.  Follow-up with me again in 2 months

## 2021-07-01 ENCOUNTER — Telehealth: Payer: Self-pay | Admitting: Internal Medicine

## 2021-07-01 NOTE — Telephone Encounter (Signed)
PA for nexletol submitted via CMM (Key: [id number])

## 2021-07-01 NOTE — Telephone Encounter (Signed)
Approvedtoday PA Case: [id number], Status: Approved, Coverage Starts on: 06/12/2021 12:00:00 AM, Coverage Ends on: 06/11/2022 12:00:00 AM

## 2021-07-14 ENCOUNTER — Ambulatory Visit: Payer: BC Managed Care – PPO | Admitting: Interventional Cardiology

## 2021-07-26 ENCOUNTER — Other Ambulatory Visit: Payer: Self-pay | Admitting: Family Medicine

## 2021-07-26 MED ORDER — VITAMIN D (ERGOCALCIFEROL) 1.25 MG (50000 UNIT) PO CAPS: 50000.0000 [IU] | ORAL_CAPSULE | ORAL | 0 refills | Status: DC

## 2021-08-09 ENCOUNTER — Telehealth: Payer: Self-pay

## 2021-08-09 DIAGNOSIS — E785 Hyperlipidemia, unspecified: Secondary | ICD-10-CM

## 2021-08-09 NOTE — Telephone Encounter (Signed)
Called and wasn't able to speak w/pt as the mailbox was full.  Did pa for repathaDERRISHA [name] (KeyAnnamaria Helling) - [id number] Repatha SureClick 140MG /ML auto-injectors Status: PA Request Created: February 28th, 2023 Sent: February 28th, 2023

## 2021-08-09 NOTE — Telephone Encounter (Signed)
-----   Message from Rosalee Kaufman, RPH-CPP sent at 08/09/2021  3:40 PM EST ----- Regarding: RE: insurance change Looks like she was originally on Repatha 140 mg.  Please call her and let her know that the insurance company has it as their formulary choice.  She switched off Repatha after having pains, but was also on pravastatin at the time.   Let her know that we can give her a sample of Repatha to try before we make the switch, to be sure it was just the pravastatin causing her pain.    Thank you,  Belenda Cruise ----- Message ----- From: Eather Colas, CMA Sent: 08/09/2021   3:22 PM EST To: [facility] Pharmd Subject: insurance change                               Pt had [employer] now [place] and they prefer repatha. Lmk how you wish to proceed

## 2021-08-15 MED ORDER — REPATHA SURECLICK 140 MG/ML ~~LOC~~ SOAJ: 140.0000 mg | SUBCUTANEOUS | 11 refills | Status: DC

## 2021-08-15 NOTE — Addendum Note (Signed)
Addended by: Eather Colas on: 08/15/2021 02:07 PM ? ? Modules accepted: Orders ? ?

## 2021-08-15 NOTE — Telephone Encounter (Signed)
Called and spoke w/pt and stated that they were approved for repatha, rx sent, labs ordered /scheduled for 12/12/21. Pt voiced understanding.  ?

## 2021-08-19 NOTE — Progress Notes (Unsigned)
[name] [address] [place] [address] Phone: [phone] Subjective:    I'm seeing this patient by the request  of:  [name], MD  CC:   [id number]  06/29/2021 Today because patient is doing relatively well we will continue with the conservative therapy.  At follow-up consider osteopathic manipulation.  We discussed with patient about the arthritic changes.  Discussed how to increase activity.  Follow-up with me again in 2 months  Update 08/24/2021 Renee [name] is a 71 y.o. female coming in with complaint of chronic LBP. Patient states        Past Medical History:  Diagnosis Date   Diabetes mellitus without complication (HCC)    HSV infection    Hyperlipidemia    Hypertension    Past Surgical History:  Procedure Laterality Date   TOE SURGERY  1996   TUBAL LIGATION     WISDOM TOOTH EXTRACTION     Social History   Socioeconomic History   Marital status: Divorced    Spouse name: Not on file   Number of children: 2   Years of education: 16+   Highest education level: Not on file  Occupational History   Not on file  Tobacco Use   Smoking status: Never   Smokeless tobacco: Never  Substance and Sexual Activity   Alcohol use: Yes    Alcohol/week: 0.0 standard drinks    Comment: Wine occass   Drug use: No   Sexual activity: Never    Comment: BTL   Other Topics Concern   Not on file  Social History Narrative   Lives at home with herself with dog.   Caffeine use: Drink 2 cups coffee per day   Social Determinants of Health   Financial Resource Strain: Not on file  Food Insecurity: Not on file  Transportation Needs: Not on file  Physical Activity: Not on file  Stress: Not on file  Social Connections: Not on file   Allergies  Allergen Reactions   Codeine Nausea Only   Family History  Problem Relation Age of Onset   Hyperlipidemia Mother    Hypertension Mother    Lung cancer Father    Cancer  Father    Breast cancer Sister    Cancer Sister    Breast cancer Maternal Aunt    Cancer Maternal Aunt    Hyperlipidemia Maternal Grandmother    Hypertension Maternal Grandmother    Colon cancer Paternal Uncle    Cancer Paternal Uncle    Colon cancer Paternal Uncle    Diabetes Daughter     Current Outpatient Medications (Endocrine & Metabolic):    metFORMIN (GLUCOPHAGE) 500 MG tablet, Take 1,000 mg by mouth daily with breakfast.   Semaglutide (OZEMPIC, 0.25 OR 0.5 MG/DOSE, [place]), Titrate 0.25 mg once weekly for 4 weeks then 0.5 mg once weekly for 2 weeks  Current Outpatient Medications (Cardiovascular):    amLODipine (NORVASC) 5 MG tablet, TAKE 1 TABLET BY MOUTH EVERY DAY   Bempedoic Acid (NEXLETOL) 180 MG TABS, Take 1 tablet by mouth daily.   Evolocumab (REPATHA SURECLICK) 140 MG/ML SOAJ, Inject 140 mg into the skin every 14 (fourteen) days.   valsartan-hydrochlorothiazide (DIOVAN-HCT) 320-25 MG tablet, TAKE 1 TABLET BY MOUTH EVERY DAY   Current Outpatient Medications (Analgesics):    aspirin 81 MG tablet, Take 81 mg by mouth daily.   Current Outpatient Medications (Other):    COVID-19 mRNA bivalent vaccine, Pfizer, (PFIZER COVID-19 VAC BIVALENT) injection, Inject into the  muscle.   LUMIGAN 0.01 % SOLN, Place 1 drop into both eyes every evening.   valACYclovir (VALTREX) 500 MG tablet, Take 200 mg by mouth as needed.   Vitamin D, Ergocalciferol, (DRISDOL) 1.25 MG (50000 UNIT) CAPS capsule, Take 1 capsule (50,000 Units total) by mouth every 7 (seven) days.   XIIDRA 5 % SOLN, Apply 1 drop to eye 2 (two) times daily.   Reviewed prior external information including notes and imaging from  primary care provider As well as notes that were available from care everywhere and other healthcare systems.  Past medical history, social, surgical and family history all reviewed in electronic medical record.  No pertanent information unless stated regarding to the chief complaint.   Review of  Systems:  No headache, visual changes, nausea, vomiting, diarrhea, constipation, dizziness, abdominal pain, skin rash, fevers, chills, night sweats, weight loss, swollen lymph nodes, body aches, joint swelling, chest pain, shortness of breath, mood changes. POSITIVE muscle aches  Objective  There were no vitals taken for this visit.   General: No apparent distress alert and oriented x3 mood and affect normal, dressed appropriately.  HEENT: Pupils equal, extraocular movements intact  Respiratory: Patient's speak in full sentences and does not appear short of breath  Cardiovascular: No lower extremity edema, non tender, no erythema  Gait normal with good balance and coordination.  MSK:  Non tender with full range of motion and good stability and symmetric strength and tone of shoulders, elbows, wrist, hip, knee and ankles bilaterally.     Impression and Recommendations:     The above documentation has been reviewed and is accurate and complete Wilford Grist

## 2021-08-24 ENCOUNTER — Ambulatory Visit (INDEPENDENT_AMBULATORY_CARE_PROVIDER_SITE_OTHER): Payer: Medicare PPO

## 2021-08-24 ENCOUNTER — Other Ambulatory Visit: Payer: Self-pay

## 2021-08-24 ENCOUNTER — Ambulatory Visit: Payer: Medicare PPO | Admitting: Family Medicine

## 2021-08-24 VITALS — BP 126/82 | HR 83 | Ht 64.0 in | Wt 171.0 lb

## 2021-08-24 DIAGNOSIS — M9902 Segmental and somatic dysfunction of thoracic region: Secondary | ICD-10-CM | POA: Diagnosis not present

## 2021-08-24 DIAGNOSIS — M25551 Pain in right hip: Secondary | ICD-10-CM | POA: Diagnosis not present

## 2021-08-24 DIAGNOSIS — M25552 Pain in left hip: Secondary | ICD-10-CM

## 2021-08-24 DIAGNOSIS — M9904 Segmental and somatic dysfunction of sacral region: Secondary | ICD-10-CM | POA: Diagnosis not present

## 2021-08-24 DIAGNOSIS — M545 Low back pain, unspecified: Secondary | ICD-10-CM | POA: Diagnosis not present

## 2021-08-24 DIAGNOSIS — G8929 Other chronic pain: Secondary | ICD-10-CM

## 2021-08-24 DIAGNOSIS — M9903 Segmental and somatic dysfunction of lumbar region: Secondary | ICD-10-CM

## 2021-08-24 NOTE — Assessment & Plan Note (Signed)
? ?  Decision today to treat with OMT was based on Physical Exam ? ?After verbal consent patient was treated with HVLA, ME, FPR techniques inhoracic, lumbar and sacral areas, all areas are chronic  ? ?Patient tolerated the procedure well with improvement in symptoms ? ?Patient given exercises, stretches and lifestyle modifications ? ?See medications in patient instructions if given ? ?Patient will follow up in 4-8 weeks ?

## 2021-08-24 NOTE — Patient Instructions (Addendum)
Xray today ?Keep increasing activity ?See me in 2 months ?

## 2021-08-24 NOTE — Assessment & Plan Note (Signed)
Patient does have known arthritic changes.  More of the facet arthropathy.  We will get x-rays of the patient's hips with patient having some mild increase in tightness.  Patient does on her x-rays still have a calcified fibroid noted and that may need to potential gynecological follow-up if she continues to have pain.  We discussed icing regimen, continuing to be active.  We discussed still avoiding excessive back extension.  Follow-up with me again in 6 to 8 weeks ?

## 2021-09-01 ENCOUNTER — Other Ambulatory Visit: Payer: Self-pay

## 2021-09-01 ENCOUNTER — Ambulatory Visit: Payer: Medicare PPO | Admitting: Interventional Cardiology

## 2021-09-01 ENCOUNTER — Encounter: Payer: Self-pay | Admitting: Interventional Cardiology

## 2021-09-01 VITALS — BP 110/70 | HR 74 | Ht 64.0 in | Wt 171.2 lb

## 2021-09-01 DIAGNOSIS — E1159 Type 2 diabetes mellitus with other circulatory complications: Secondary | ICD-10-CM

## 2021-09-01 DIAGNOSIS — E785 Hyperlipidemia, unspecified: Secondary | ICD-10-CM

## 2021-09-01 DIAGNOSIS — I10 Essential (primary) hypertension: Secondary | ICD-10-CM | POA: Diagnosis not present

## 2021-09-01 DIAGNOSIS — I2510 Atherosclerotic heart disease of native coronary artery without angina pectoris: Secondary | ICD-10-CM | POA: Diagnosis not present

## 2021-09-01 DIAGNOSIS — I7789 Other specified disorders of arteries and arterioles: Secondary | ICD-10-CM

## 2021-09-01 NOTE — Patient Instructions (Signed)

## 2021-09-01 NOTE — Progress Notes (Signed)
?Cardiology Office Note:   ? ?Date:  09/01/2021  ? ?ID:  Renee [name], DOB 11/10/1950, MRN [id number] ? ?PCP:  Thacker, Robert, MD  ?Cardiologist:  Henry W Smith III, MD  ? ?Referring MD: Thacker, Robert, MD  ? ?Chief Complaint  ?Patient presents with  ? Hyperlipidemia  ? Coronary Artery Disease  ? ? ?History of Present Illness:   ? ?Renee [name] is a 70 y.o. female with a hx of familial hyperlipidemia, DM II, low risk coronary calcium score 115 (85th percentile), essential hypertension, and recent history of right sixth nerve palsy.  Has been seen in lipid clinic.  Currently on Repatha for lipid elevation. ?  ? ? ?Doing well.  No complaints.  No medication side effects.  Feels amlodipine may be causing her to have stiffness.  She is not on statin. ? ?Is a member of [employer]. ? ?Past Medical History:  ?Diagnosis Date  ? Diabetes mellitus without complication (HCC)   ? HSV infection   ? Hyperlipidemia   ? Hypertension   ? ? ?Past Surgical History:  ?Procedure Laterality Date  ? TOE SURGERY  1996  ? TUBAL LIGATION    ? WISDOM TOOTH EXTRACTION    ? ? ?Current Medications: ?Current Meds  ?Medication Sig  ? amLODipine (NORVASC) 5 MG tablet TAKE 1 TABLET BY MOUTH EVERY DAY  ? aspirin 81 MG tablet Take 81 mg by mouth daily.  ? Bempedoic Acid (NEXLETOL) 180 MG TABS Take 1 tablet by mouth daily.  ? Blood Glucose Monitoring Suppl (ACCU-CHEK GUIDE) w/Device KIT as directed.  ? COVID-19 mRNA bivalent vaccine, Pfizer, (PFIZER COVID-19 VAC BIVALENT) injection Inject into the muscle.  ? Evolocumab (REPATHA SURECLICK) 140 MG/ML SOAJ Inject 140 mg into the skin every 14 (fourteen) days.  ? glucose blood (ACCU-CHEK GUIDE) test strip as directed.  ? LUMIGAN 0.01 % SOLN Place 1 drop into both eyes every evening.  ? metFORMIN (GLUCOPHAGE) 500 MG tablet Take 1,000 mg by mouth daily with breakfast.  ? Semaglutide (OZEMPIC, 1 MG/DOSE, [place]) Inject 1 mg into the skin once a week.  ? valACYclovir (VALTREX) 500 MG tablet Take 200 mg  by mouth as needed.  ? valsartan-hydrochlorothiazide (DIOVAN-HCT) 320-25 MG tablet TAKE 1 TABLET BY MOUTH EVERY DAY  ? Vitamin D, Ergocalciferol, (DRISDOL) 1.25 MG (50000 UNIT) CAPS capsule Take 1 capsule (50,000 Units total) by mouth every 7 (seven) days.  ? XIIDRA 5 % SOLN Apply 1 drop to eye 2 (two) times daily.  ?  ? ?Allergies:   Codeine  ? ?Social History  ? ?Socioeconomic History  ? Marital status: Divorced  ?  Spouse name: Not on file  ? Number of children: 2  ? Years of education: 16+  ? Highest education level: Not on file  ?Occupational History  ? Not on file  ?Tobacco Use  ? Smoking status: Never  ? Smokeless tobacco: Never  ?Substance and Sexual Activity  ? Alcohol use: Yes  ?  Alcohol/week: 0.0 standard drinks  ?  Comment: Wine occass  ? Drug use: No  ? Sexual activity: Never  ?  Comment: BTL   ?Other Topics Concern  ? Not on file  ?Social History Narrative  ? Lives at home with herself with dog.  ? Caffeine use: Drink 2 cups coffee per day  ? ?Social Determinants of Health  ? ?Financial Resource Strain: Not on file  ?Food Insecurity: Not on file  ?Transportation Needs: Not on file  ?Physical Activity: Not on file  ?Stress:   Not on file  ?Social Connections: Not on file  ?  ? ?Family History: ?The patient's family history includes Breast cancer in her maternal aunt and sister; Cancer in her father, maternal aunt, paternal uncle, and sister; Colon cancer in her paternal uncle and paternal uncle; Diabetes in her daughter; Hyperlipidemia in her maternal grandmother and mother; Hypertension in her maternal grandmother and mother; Lung cancer in her father. ? ?ROS:   ?Please see the history of present illness.    ?No other complaints all other systems reviewed and are negative. ? ?EKGs/Labs/Other Studies Reviewed:   ? ?The following studies were reviewed today: ?Coronary calcium score 2019: ?IMPRESSION: ?1. Coronary calcium score of 107. This was 80 percentile for age and ?sex matched control. ?  ?2. Normal  coronary origin with left dominance. ?  ?3. Mild CAD in the proximal LAD and LCX arteries. Aggressive risk ?factor modification is recommended. ?  ?4. Mildly dilated pulmonary artery measuring 33 mm. ?  ?5. Normal size of the thoracic aorta. There is no evidence for ?ascending aortic aneurysm. ?  ? ?EKG:  EKG normal sinus rhythm with mild left axis deviation.  Poor R wave progression.  When compared to the prior tracing from July 2021, noted. ? ?Recent Labs: ?No results found for requested labs within last 8760 hours.  ?Recent Lipid Panel ?   ?Component Value Date/Time  ? CHOL 139 06/09/2021 0850  ? CHOL 346 (H) 06/17/2015 1431  ? TRIG 41 06/09/2021 0850  ? TRIG 89 06/17/2015 1431  ? HDL 62 06/09/2021 0850  ? HDL 71 06/17/2015 1431  ? CHOLHDL 2.2 06/09/2021 0850  ? CHOLHDL 4.9 06/17/2015 1431  ? LDLCALC 67 06/09/2021 0850  ? LDLCALC 257 (H) 06/17/2015 1431  ? ? ?Physical Exam:   ? ?VS:  BP 110/70   Pulse 74   Ht 5' 4" (1.626 m)   Wt 171 lb 3.2 oz (77.7 kg)   SpO2 97%   BMI 29.39 kg/m?    ? ?Wt Readings from Last 3 Encounters:  ?09/01/21 171 lb 3.2 oz (77.7 kg)  ?08/24/21 171 lb (77.6 kg)  ?06/29/21 175 lb (79.4 kg)  ?  ? ?GEN: Overweight. No acute distress ?HEENT: Normal ?NECK: No JVD. ?LYMPHATICS: No lymphadenopathy ?CARDIAC: No murmur. RRR no gallop, or edema. ?VASCULAR:  Normal Pulses. No bruits. ?RESPIRATORY:  Clear to auscultation without rales, wheezing or rhonchi  ?ABDOMEN: Soft, non-tender, non-distended, No pulsatile mass, ?MUSCULOSKELETAL: No deformity  ?SKIN: Warm and dry ?NEUROLOGIC:  Alert and oriented x 3 ?PSYCHIATRIC:  Normal affect  ? ?ASSESSMENT:   ? ?1. Coronary artery calcification seen on CAT scan   ?2. Hyperlipidemia, unspecified hyperlipidemia type   ?3. Controlled type 2 diabetes mellitus with other circulatory complication, without long-term current use of insulin (HCC)   ?4. Essential hypertension   ?5. Enlarged aorta (HCC)   ? ?PLAN:   ? ?In order of problems listed above: ? ?Reviewed  primary/secondary prevention. ?LDL cholesterol most recently 67 in December.  Continue evolocumab, Nexletol. ?Consider SGLT2 therapy if she develops shortness of breath or any concern for the possibility of diastolic dysfunction. ?Continue valsartan HCT, and Norvasc. ?Continue excellent blood pressure control ? ?Overall education and awareness concerning primary/secondary risk prevention was discussed in detail: LDL less than 70, hemoglobin A1c less than 7, blood pressure target less than 130/80 mmHg, >150 minutes of moderate aerobic activity per week, avoidance of smoking, weight control (via diet and exercise), and continued surveillance/management of/for obstructive sleep apnea. ? ? ?  Image the aorta in the future. ? ? ?Medication Adjustments/Labs and Tests Ordered: ?Current medicines are reviewed at length with the patient today.  Concerns regarding medicines are outlined above.  ?Orders Placed This Encounter  ?Procedures  ? EKG 12-Lead  ? ?No orders of the defined types were placed in this encounter. ? ? ?Patient Instructions  ?Medication Instructions:  ?Your physician recommends that you continue on your current medications as directed. Please refer to the Current Medication list given to you today. ? ?*If you need a refill on your cardiac medications before your next appointment, please call your pharmacy* ? ? ?Lab Work: ?None ?If you have labs (blood work) drawn today and your tests are completely normal, you will receive your results only by: ?MyChart Message (if you have MyChart) OR ?A paper copy in the mail ?If you have any lab test that is abnormal or we need to change your treatment, we will call you to review the results. ? ? ?Testing/Procedures: ?None ? ? ?Follow-Up: ?At [facility], you and your health needs are our priority.  As part of our continuing mission to provide you with exceptional heart care, we have created designated Provider Care Teams.  These Care Teams include your primary  Cardiologist (physician) and Advanced Practice Providers (APPs -  Physician Assistants and Nurse Practitioners) who all work together to provide you with the care you need, when you need it. ? ?We recommend signing

## 2021-09-13 ENCOUNTER — Ambulatory Visit: Payer: BC Managed Care – PPO | Admitting: Interventional Cardiology

## 2021-10-20 NOTE — Progress Notes (Signed)
?Renee [name] D.O. ?[facility] ?[address] [place] [address] ?Phone: [phone] ?Subjective:   ?I, Wilford Grist, am serving as a scribe for Dr. Antoine Primas. ? ?This visit occurred during the SARS-CoV-2 public health emergency.  Safety protocols were in place, including screening questions prior to the visit, additional usage of staff PPE, and extensive cleaning of exam room while observing appropriate contact time as indicated for disinfecting solutions.  ?I'm seeing this patient by the request  of:  [name], MD ? ?CC: Back and neck pain follow-up 1 ? ?[id number]  ?Renee [name] is a 71 y.o. female coming in with complaint of back and neck pain. OMT 08/24/2021. Patient states that she is doing better. Has been trying to wear supportive shoes. Is out of Vit D.  Patient unfortunately did have to sleep in the hospital with her mother over the weekend.  This could potentially cause some more tightness.  Has had a little more stress recently. ? ?Medications patient has been prescribed: Vit D ? ?Taking: ? ? ?  ? ? ? ? ?Reviewed prior external information including notes and imaging from previsou exam, outside providers and external EMR if available.  ? ?As well as notes that were available from care everywhere and other healthcare systems. ? ?Past medical history, social, surgical and family history all reviewed in electronic medical record.  No pertanent information unless stated regarding to the chief complaint.  ? ?Past Medical History:  ?Diagnosis Date  ? Diabetes mellitus without complication (HCC)   ? HSV infection   ? Hyperlipidemia   ? Hypertension   ?  ?Allergies  ?Allergen Reactions  ? Codeine Nausea Only  ? ? ? ?Review of Systems: ? No headache, visual changes, nausea, vomiting, diarrhea, constipation, dizziness, abdominal pain, skin rash, fevers, chills, night sweats, weight loss, swollen lymph nodes,  joint swelling, chest pain, shortness of breath, mood changes.  POSITIVE muscle aches, body aches ? ?Objective  ?Blood pressure 110/82, pulse 78, height 5\' 4"  (1.626 m), weight 168 lb (76.2 kg), SpO2 98 %. ?  ?General: No apparent distress alert and oriented x3 mood and affect normal, dressed appropriately.  ?HEENT: Pupils equal, extraocular movements intact  ?Respiratory: Patient's speak in full sentences and does not appear short of breath  ?Cardiovascular: No lower extremity edema, non tender, no erythema  ?Gait normal with good balance and coordination.  ?MSK:  Non tender with full range of motion and good stability and symmetric strength and tone of shoulders, elbows, wrist, hip, knee and ankles bilaterally.  ?Back Low back exam does have significant tightness noted.  Seems to be more in the sacral area does have tightness noted in the lower left greater than right today.  Patient does have tightness of the upper neck as well. ? ?Osteopathic findings ?t ?C6 flexed rotated and side bent left ?T4 extended rotated and side bent right inhaled rib ?T8 extended rotated and side bent left ?L2 flexed rotated and side bent right ?Sacrum left on left ? ? ? ? ?  ?Assessment and Plan: ? ?Low back pain ?Chronic, with mild exacerbation.  Still responding relatively well to osteopathic manipulation.  We discussed icing regimen and home exercise, which activities to do which ones to avoid, increase activity slowly.  Continue with the conservative therapy and follow-up again in 6 weeks.  ? ?Nonallopathic problems ? ?Decision today to treat with OMT was based on Physical Exam ? ?After verbal consent patient was treated with HVLA, ME, FPR techniques  in cervical, rib, thoracic, lumbar, and sacral  areas ? ?Patient tolerated the procedure well with improvement in symptoms ? ?Patient given exercises, stretches and lifestyle modifications ? ?See medications in patient instructions if given ? ?Patient will follow up in 4-8 weeks ? ?  ? ? ?The above documentation has been reviewed and is accurate  and complete Judi Saa, DO ? ? ? ?  ? ? Note: This dictation was prepared with Dragon dictation along with smaller phrase technology. Any transcriptional errors that result from this process are unintentional.    ?  ?  ? ?

## 2021-10-25 ENCOUNTER — Ambulatory Visit: Payer: Medicare PPO | Admitting: Family Medicine

## 2021-10-25 VITALS — BP 110/82 | HR 78 | Ht 64.0 in | Wt 168.0 lb

## 2021-10-25 DIAGNOSIS — M9903 Segmental and somatic dysfunction of lumbar region: Secondary | ICD-10-CM | POA: Diagnosis not present

## 2021-10-25 DIAGNOSIS — M9908 Segmental and somatic dysfunction of rib cage: Secondary | ICD-10-CM

## 2021-10-25 DIAGNOSIS — M9901 Segmental and somatic dysfunction of cervical region: Secondary | ICD-10-CM | POA: Diagnosis not present

## 2021-10-25 DIAGNOSIS — M9904 Segmental and somatic dysfunction of sacral region: Secondary | ICD-10-CM | POA: Diagnosis not present

## 2021-10-25 DIAGNOSIS — M9902 Segmental and somatic dysfunction of thoracic region: Secondary | ICD-10-CM | POA: Diagnosis not present

## 2021-10-25 DIAGNOSIS — M545 Low back pain, unspecified: Secondary | ICD-10-CM

## 2021-10-25 DIAGNOSIS — G8929 Other chronic pain: Secondary | ICD-10-CM

## 2021-10-25 NOTE — Patient Instructions (Signed)
Good to see you ?Ask mom's cardiologist about loop recorder  ?2000IU of Vit D daily ?See me in 6 weeks ?

## 2021-10-25 NOTE — Assessment & Plan Note (Signed)
Chronic, with mild exacerbation.  Still responding relatively well to osteopathic manipulation.  We discussed icing regimen and home exercise, which activities to do which ones to avoid, increase activity slowly.  Continue with the conservative therapy and follow-up again in 6 weeks. ?

## 2021-10-28 DIAGNOSIS — D259 Leiomyoma of uterus, unspecified: Secondary | ICD-10-CM | POA: Insufficient documentation

## 2021-11-10 DIAGNOSIS — I2510 Atherosclerotic heart disease of native coronary artery without angina pectoris: Secondary | ICD-10-CM | POA: Insufficient documentation

## 2021-11-10 DIAGNOSIS — E7800 Pure hypercholesterolemia, unspecified: Secondary | ICD-10-CM | POA: Insufficient documentation

## 2021-11-11 ENCOUNTER — Other Ambulatory Visit: Payer: Self-pay | Admitting: Obstetrics and Gynecology

## 2021-11-11 DIAGNOSIS — R5381 Other malaise: Secondary | ICD-10-CM

## 2021-11-23 ENCOUNTER — Other Ambulatory Visit: Payer: Self-pay | Admitting: Interventional Cardiology

## 2021-11-27 ENCOUNTER — Other Ambulatory Visit: Payer: Self-pay | Admitting: Family Medicine

## 2021-11-28 ENCOUNTER — Other Ambulatory Visit: Payer: Self-pay | Admitting: Obstetrics and Gynecology

## 2021-11-28 DIAGNOSIS — Z780 Asymptomatic menopausal state: Secondary | ICD-10-CM

## 2021-12-01 NOTE — Progress Notes (Unsigned)
[name] [address] [place] [address] Phone: [phone] Subjective:   Renee [name], am serving as a scribe for Dr. Antoine Primas.  I'm seeing this patient by the request  of:  [name], MD  CC:  Back pain, foot pain [id number]  Renee [name] is a 71 y.o. female coming in with complaint of back and neck pain. OMT 10/25/2021. Patient states that she has good and bad days. Feels more stiff and achy. Tried yoga.  Patient also notes pain in both feet over midfoot for past 2 months. Pain is not worsening but not improving. Painful to go on walks.  Medications patient has been prescribed: Vit D  Taking:         Reviewed prior external information including notes and imaging from previsou exam, outside providers and external EMR if available.   As well as notes that were available from care everywhere and other healthcare systems.  Past medical history, social, surgical and family history all reviewed in electronic medical record.  No pertanent information unless stated regarding to the chief complaint.   Past Medical History:  Diagnosis Date   Diabetes mellitus without complication (HCC)    HSV infection    Hyperlipidemia    Hypertension     Allergies  Allergen Reactions   Codeine Nausea Only     Review of Systems:  No headache, visual changes, nausea, vomiting, diarrhea, constipation, dizziness, abdominal pain, skin rash, fevers, chills, night sweats, weight loss, swollen lymph nodes, body aches, joint swelling, chest pain, shortness of breath, mood changes. POSITIVE muscle aches  Objective  Blood pressure 110/78, pulse 72, height 5\' 4"  (1.626 m), weight 168 lb (76.2 kg), SpO2 98 %.   General: No apparent distress alert and oriented x3 mood and affect normal, dressed appropriately.  HEENT: Pupils equal, extraocular movements intact  Respiratory: Patient's speak in full sentences and does not appear short of  breath  Cardiovascular: No lower extremity edema, non tender, no erythema  Gait antalgic.  Foot exam shows the patient does have pes planus with overpronation.  Patient has had surgery on the first ray bilaterally.  Patient does have a rigid midfoot noted. MSK:  Back low back exam he does have some loss of lordosis.  Tenderness to palpation in the parascapular region.  Tightness noted in the lower back as well.  Negative straight leg test.  Osteopathic findings  C2 flexed rotated and side bent right C6 flexed rotated and side bent left T3 extended rotated and side bent right inhaled rib T9 extended rotated and side bent left L2 flexed rotated and side bent right L5 flexed rotated and side bent right Sacrum right on right       Assessment and Plan:  Low back pain Chronic problem with mild exacerbation also noted.  Discussed with patient about the vitamin D, will continue to stay active.  Hip abductor strengthening again.  Discussed proper shoes with patient having difficulty with some of the rigid midfoot.  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  The patient is to try different types of shoes, rocker-bottom shoes.  Worsening pain can consider injections    Nonallopathic problems  Decision today to treat with OMT was based on Physical Exam  After verbal consent patient was treated with HVLA, ME, FPR techniques in cervical, rib, thoracic, lumbar, and sacral  areas  Patient tolerated the procedure well with improvement  in symptoms  Patient given exercises, stretches and lifestyle modifications  See medications in patient instructions if given  Patient will follow up in 4-8 weeks     The above documentation has been reviewed and is accurate and complete Judi Saa, DO         Note: This dictation was prepared with Dragon dictation along with smaller phrase technology. Any transcriptional errors that result from  this process are unintentional.

## 2021-12-07 ENCOUNTER — Ambulatory Visit (INDEPENDENT_AMBULATORY_CARE_PROVIDER_SITE_OTHER): Payer: Medicare PPO | Admitting: Family Medicine

## 2021-12-07 VITALS — BP 110/78 | HR 72 | Ht 64.0 in | Wt 168.0 lb

## 2021-12-07 DIAGNOSIS — M19079 Primary osteoarthritis, unspecified ankle and foot: Secondary | ICD-10-CM

## 2021-12-07 DIAGNOSIS — M545 Low back pain, unspecified: Secondary | ICD-10-CM

## 2021-12-07 DIAGNOSIS — M9904 Segmental and somatic dysfunction of sacral region: Secondary | ICD-10-CM | POA: Diagnosis not present

## 2021-12-07 DIAGNOSIS — M9903 Segmental and somatic dysfunction of lumbar region: Secondary | ICD-10-CM | POA: Diagnosis not present

## 2021-12-07 DIAGNOSIS — G8929 Other chronic pain: Secondary | ICD-10-CM

## 2021-12-07 DIAGNOSIS — M9908 Segmental and somatic dysfunction of rib cage: Secondary | ICD-10-CM | POA: Diagnosis not present

## 2021-12-07 DIAGNOSIS — M9902 Segmental and somatic dysfunction of thoracic region: Secondary | ICD-10-CM | POA: Diagnosis not present

## 2021-12-07 DIAGNOSIS — M9901 Segmental and somatic dysfunction of cervical region: Secondary | ICD-10-CM | POA: Diagnosis not present

## 2021-12-07 NOTE — Assessment & Plan Note (Signed)
Bilateral with a rigid midfoot.  The patient is to try different types of shoes, rocker-bottom shoes.  Worsening pain can consider injections

## 2021-12-07 NOTE — Assessment & Plan Note (Signed)
Chronic problem with mild exacerbation also noted.  Discussed with patient about the vitamin D, will continue to stay active.  Hip abductor strengthening again.  Discussed proper shoes with patient having difficulty with some of the rigid midfoot.  Follow-up with me again in 6 to 8 weeks

## 2021-12-07 NOTE — Patient Instructions (Signed)
Good to see you! Congrats on the perfect daughter Oofos in house More rigid shoe the better Ice and arnica lotions before bed See you again in 2 months

## 2021-12-12 ENCOUNTER — Other Ambulatory Visit: Payer: Medicare PPO | Admitting: *Deleted

## 2021-12-12 DIAGNOSIS — E785 Hyperlipidemia, unspecified: Secondary | ICD-10-CM

## 2021-12-12 LAB — HEPATIC FUNCTION PANEL
ALT: 11 IU/L (ref 0–32)
AST: 16 IU/L (ref 0–40)
Albumin: 4.2 g/dL (ref 3.8–4.8)
Alkaline Phosphatase: 51 IU/L (ref 44–121)
Bilirubin Total: 0.3 mg/dL (ref 0.0–1.2)
Bilirubin, Direct: <0.1 mg/dL (ref 0.00–0.40)
Total Protein: 7 g/dL (ref 6.0–8.5)

## 2021-12-12 LAB — LIPID PANEL
Chol/HDL Ratio: 2.9 ratio (ref 0.0–4.4)
Cholesterol, Total: 166 mg/dL (ref 100–199)
HDL: 57 mg/dL (ref >39)
LDL Chol Calc (NIH): 97 mg/dL (ref 0–99)
Triglycerides: 62 mg/dL (ref 0–149)
VLDL Cholesterol Cal: 12 mg/dL (ref 5–40)

## 2021-12-21 ENCOUNTER — Other Ambulatory Visit: Payer: Self-pay | Admitting: Obstetrics and Gynecology

## 2021-12-21 DIAGNOSIS — Z1231 Encounter for screening mammogram for malignant neoplasm of breast: Secondary | ICD-10-CM

## 2022-01-06 ENCOUNTER — Encounter: Payer: Self-pay | Admitting: Internal Medicine

## 2022-01-25 ENCOUNTER — Ambulatory Visit: Payer: Self-pay

## 2022-01-25 ENCOUNTER — Encounter: Payer: Self-pay | Admitting: Family Medicine

## 2022-01-25 ENCOUNTER — Ambulatory Visit: Payer: Medicare PPO | Admitting: Family Medicine

## 2022-01-25 VITALS — HR 83 | Ht 64.0 in | Wt 168.0 lb

## 2022-01-25 DIAGNOSIS — M19079 Primary osteoarthritis, unspecified ankle and foot: Secondary | ICD-10-CM | POA: Diagnosis not present

## 2022-01-25 MED ORDER — GABAPENTIN 100 MG PO CAPS: 200.0000 mg | ORAL_CAPSULE | Freq: Every day | ORAL | 0 refills | Status: DC

## 2022-01-25 NOTE — Assessment & Plan Note (Signed)
Patient does have arthritis of the midfoot with increasing in hypoechoic changes.  Concerned that patient also has a neuroma noted.  We discussed different treatment options including possible injection which patient declined at this point.  Discussed with patient icing regimen and home exercises.  Increase activity slowly at this moment.  Follow-up with me again in 4 to 6 weeks otherwise.  Could also do advanced imaging to make sure there is no occult fracture.

## 2022-01-25 NOTE — Progress Notes (Signed)
Renee [name] [address] [place] Phone: [phone] Subjective:   Renee [name], am serving as a scribe for Dr. Hulan Saas.  I'm seeing this patient by the request  of:  Renee Dials, MD  CC: Foot pain follow-up  [id number]  12/07/2021 Bilateral with a rigid midfoot.  The patient is to try different types of shoes, rocker-bottom shoes.  Worsening pain can consider injections Also seen for OMT  Updated 01/25/2022 Renee [name] is a 71 y.o. female coming in with complaint of left foot pain. Pain is moving from top to bottom of foot. Pain while doing Tai Chi. Pressure on foot, pointing foot down (plantarflexion) causes sharp pain in foot. Pain radiates to the bottom of foot when pressure is applied. Not tender to touch. The whole foot is starting to feel a little numb and moving up to ankle. Stretching foot is painful. Constant dull ache on top of foot. Wearing the recovery sandals has helped. Walking more supinated to relieve pain. Pinky toe is also hurting. Described as a stinging feeling. No other complaints.       Past Medical History:  Diagnosis Date   Diabetes mellitus without complication ([place])    HSV infection    Hyperlipidemia    Hypertension    Past Surgical History:  Procedure Laterality Date   TOE SURGERY  1996   TUBAL LIGATION     WISDOM TOOTH EXTRACTION     Social History   Socioeconomic History   Marital status: Divorced    Spouse name: Not on file   Number of children: 2   Years of education: 16+   Highest education level: Not on file  Occupational History   Not on file  Tobacco Use   Smoking status: Never   Smokeless tobacco: Never  Substance and Sexual Activity   Alcohol use: Yes    Alcohol/week: 0.0 standard drinks of alcohol    Comment: Wine occass   Drug use: No   Sexual activity: Never    Comment: BTL   Other Topics Concern   Not on file  Social History Narrative   Lives at  home with herself with dog.   Caffeine use: Drink 2 cups coffee per day   Social Determinants of Health   Financial Resource Strain: Not on file  Food Insecurity: Not on file  Transportation Needs: Not on file  Physical Activity: Not on file  Stress: Not on file  Social Connections: Not on file   Allergies  Allergen Reactions   Codeine Nausea Only   Family History  Problem Relation Age of Onset   Hyperlipidemia Mother    Hypertension Mother    Lung cancer Father    Cancer Father    Breast cancer Sister    Cancer Sister    Breast cancer Maternal Aunt    Cancer Maternal Aunt    Hyperlipidemia Maternal Grandmother    Hypertension Maternal Grandmother    Colon cancer Paternal Uncle    Cancer Paternal Uncle    Colon cancer Paternal Uncle    Diabetes Daughter     Current Outpatient Medications (Endocrine & Metabolic):    metFORMIN (GLUCOPHAGE) 500 MG tablet, Take 1,000 mg by mouth daily with breakfast.   Semaglutide (OZEMPIC, 1 MG/DOSE, [place]), Inject 1 mg into the skin once a week.  Current Outpatient Medications (Cardiovascular):    amLODipine (NORVASC) 5 MG tablet, TAKE 1 TABLET BY MOUTH EVERY DAY   Bempedoic Acid (NEXLETOL) 180  MG TABS, Take 1 tablet by mouth daily.   Evolocumab (REPATHA SURECLICK) 021 MG/ML SOAJ, Inject 140 mg into the skin every 14 (fourteen) days.   valsartan-hydrochlorothiazide (DIOVAN-HCT) 320-25 MG tablet, TAKE 1 TABLET BY MOUTH EVERY DAY   Current Outpatient Medications (Analgesics):    aspirin 81 MG tablet, Take 81 mg by mouth daily.   Current Outpatient Medications (Other):    gabapentin (NEURONTIN) 100 MG capsule, Take 2 capsules (200 mg total) by mouth at bedtime.   Blood Glucose Monitoring Suppl (ACCU-CHEK GUIDE) w/Device KIT, as directed.   COVID-19 mRNA bivalent vaccine, Pfizer, (PFIZER COVID-19 VAC BIVALENT) injection, Inject into the muscle.   glucose blood (ACCU-CHEK GUIDE) test strip, as directed.   LUMIGAN 0.01 % SOLN, Place 1 drop  into both eyes every evening.   valACYclovir (VALTREX) 500 MG tablet, Take 200 mg by mouth as needed.   Vitamin D, Ergocalciferol, (DRISDOL) 1.25 MG (50000 UNIT) CAPS capsule, TAKE 1 CAPSULE (50,000 UNITS TOTAL) BY MOUTH EVERY 7 (SEVEN) DAYS   XIIDRA 5 % SOLN, Apply 1 drop to eye 2 (two) times daily.   Reviewed prior external information including notes and imaging from  primary care provider As well as notes that were available from care everywhere and other healthcare systems.  Past medical history, social, surgical and family history all reviewed in electronic medical record.  No pertanent information unless stated regarding to the chief complaint.   Review of Systems:  No headache, visual changes, nausea, vomiting, diarrhea, constipation, dizziness, abdominal pain, skin rash, fevers, chills, night sweats, weight loss, swollen lymph nodes, body aches, joint swelling, chest pain, shortness of breath, mood changes. POSITIVE muscle aches  Objective  Pulse 83, height _0  (1.626 m), weight 168 lb (76.2 kg), SpO2 98 %.   General: No apparent distress alert and oriented x3 mood and affect normal, dressed appropriately.  HEENT: Pupils equal, extraocular movements intact  Respiratory: Patient's speak in full sentences and does not appear short of breath  Cardiovascular: No lower extremity edema, non tender, no erythema  Patient does have some swelling over the left dorsum of the foot.  Patient does have a positive squeeze test noted as well of the foot.  No pain over the navicular bone.   Limited muscular skeletal ultrasound was performed and interpreted by Hulan Saas, M Limited ultrasound of patient's foot shows that there is hypoechoic changes of the midfoot noted.  This is consistent with the effusion.  No cortical irregularity other than narrowing of the space.  Patient does have a hypoechoic change between the third and fourth metatarsal heads that is consistent with an early neuroma  just distal to the midfoot. Impression: Severe foot arthritis with effusion noted in early neuroma   Impression and Recommendations:    The above documentation has been reviewed and is accurate and complete Renee Pulley, DO

## 2022-01-25 NOTE — Patient Instructions (Addendum)
Gabapentin 200mg  prescribed Sports medicine referral for orthotics Keep wearing good shoes Keep taking Vit D Keep next appointment

## 2022-01-31 ENCOUNTER — Ambulatory Visit (INDEPENDENT_AMBULATORY_CARE_PROVIDER_SITE_OTHER): Payer: Medicare PPO | Admitting: Sports Medicine

## 2022-01-31 VITALS — BP 138/72 | Ht 64.5 in | Wt 165.0 lb

## 2022-01-31 DIAGNOSIS — M79672 Pain in left foot: Secondary | ICD-10-CM | POA: Diagnosis not present

## 2022-01-31 NOTE — Assessment & Plan Note (Addendum)
Previously diagnosed as midfoot arthritis by Dr. Katrinka Blazing on ultrasound.  Recommend patient return to Dr. Katrinka Blazing to discuss abnormal sensation and pins-and-needles of left lower extremity.  Given patient's desired avoidance of thick custom insoles at this time and modest improvement with good shoes, we will pursue thin inserts with scaphoid pads at this time.  Follow-up in 4 to 6 weeks. If she finds benefit in these interventions, we will proceed with custom orthotics.

## 2022-01-31 NOTE — Progress Notes (Unsigned)
SUBJECTIVE:   CHIEF COMPLAINT / HPI:   Left foot pain Renee [name] is a pleasant 71 year old female referred to [place] by Dr. Ayesha Mohair of [facility] for consideration of custom orthotics.  Patient reports insidious onset of left midfoot pain; experiences a dull ache sometimes on top of foot, sometimes bottom, sometimes posterior ankle.  She is also been experiencing a pins-and-needles numb sensation of the left lower extremity from the mid calf to toes.  She describes this feeling as though her foot is "waking up from anesthesia".  She also reports constant burning sensation of her left fifth toe.  She also wanted to make sure to tell us about nocturnal muscle cramps of the bilateral inner thighs, as she is unsure if this is related to the left foot.  She reports her bilateral inner thighs sometimes get the tight muscle contracture feeling as though they are about to cramp, although they never fully cramp.  She denies bowel or bladder incontinence, saddle anesthesia, or focal neurological deficit.  No gait abnormalities or difficulty lifting left leg or foot.  At her last appointment with Dr. Katrinka Blazing 01/25/2022, she reported anterior foot swelling after a trip to the [facility] where she walked quite a bit-up to 20,000 steps in 1 day while visiting the [facility].  Dr. Katrinka Blazing did an ultrasound of her left foot where he identified midfoot arthritis without occult fracture.  She reports improvement of her pain after finding the Oofos sandals, Hoka tennis shoes, and an off-the-shelf insert for the tennis shoes.  She reports that previously she had custom orthotics from a podiatry office that she found to be big, bulky, and quite unpleasant.  She would prefer to avoid that style.  PERTINENT  PMH / PSH: Left midfoot arthritis, bilateral planter fasciitis, left rotator cuff injury s/p surgical repair at [facility], HTN, HLD, T2DM well-controlled, heart murmur  OBJECTIVE:   BP 138/72    Ht 5' 4.5" (1.638 m)   Wt 165 lb (74.8 kg)   BMI 27.88 kg/m    Left foot Inspection reveals mild swelling over left forefoot and lateral foot No ecchymosis present No tenderness to palpation over any area of the foot today Palpable dorsalis pedis and pretibial pulses Altered light touch sensation of left foot up to level of mid calf; reported as numb compared to the right Full ROM of knees, ankles, and toes bilaterally No pain with passive and active foot inversion/eversion or internal/external rotation Strength 5/5 of hips, knees, ankles, and toes; equal bilaterally  ASSESSMENT/PLAN:   Left foot pain Previously diagnosed as midfoot arthritis by Dr. Katrinka Blazing on ultrasound.  Recommend patient return to Dr. Katrinka Blazing to discuss abnormal sensation and pins-and-needles of left lower extremity.  Given patient's desired avoidance of thick custom insoles at this time and modest improvement with good shoes, we will pursue thin inserts with scaphoid pads at this time.  Follow-up in 4 to 6 weeks. If she finds benefit in these interventions, we will proceed with custom orthotics.     [name], MD [place] [facility]   Patient seen and evaluated with the resident.  I agree with the above plan of care.  Patient has found good symptom relief with oofos and off the shelf inserts.  After discussion, we elected not to go the route of custom orthotics.  Instead, we provided her with a pair of green sports insoles and scaphoid pads to wear in additional shoes.  We will also  fit her dress shoes with some scaphoid pads.  Return to Dr. Katrinka Blazing as scheduled and follow-up with me as needed.  This note was dictated using Dragon naturally speaking software and may contain errors in syntax, spelling, or content which have not been identified prior to signing this note.

## 2022-02-01 ENCOUNTER — Other Ambulatory Visit: Payer: Self-pay | Admitting: Obstetrics and Gynecology

## 2022-02-01 DIAGNOSIS — Z780 Asymptomatic menopausal state: Secondary | ICD-10-CM

## 2022-02-07 NOTE — Progress Notes (Unsigned)
[place] Phone: [phone] Subjective:   Fontaine No, am serving as a scribe for Dr. Hulan Saas.  I'm seeing this patient by the request  of:  Aura Dials, MD  CC: Foot pain, back pain follow-up  [id number]  01/25/2022 Patient does have arthritis of the midfoot with increasing in hypoechoic changes.  Concerned that patient also has a neuroma noted.  We discussed different treatment options including possible injection which patient declined at this point.  Discussed with patient icing regimen and home exercises.  Increase activity slowly at this moment.  Follow-up with me again in 4 to 6 weeks otherwise.  Could also do advanced imaging to make sure there is no occult fracture.  Update 8/32023 [place] Zaragoza is a 71 y.o. female coming in with complaint of L foot pain. Patient states that her foot pain has improved. Wearing OOFOS and HOKA. Is going to get an ankle sleeve but overall her pain has improved.   Pain in lumbar spine is persisting. Feels like a soreness especially with movement. Has not been doing any modalities for back.      Past Medical History:  Diagnosis Date   Diabetes mellitus without complication ([place])    HSV infection    Hyperlipidemia    Hypertension    Past Surgical History:  Procedure Laterality Date   TOE SURGERY  1996   TUBAL LIGATION     WISDOM TOOTH EXTRACTION     Social History   Socioeconomic History   Marital status: Divorced    Spouse name: Not on file   Number of children: 2   Years of education: 16+   Highest education level: Not on file  Occupational History   Not on file  Tobacco Use   Smoking status: Never   Smokeless tobacco: Never  Substance and Sexual Activity   Alcohol use: Yes    Alcohol/week: 0.0 standard drinks of alcohol    Comment: Wine occass   Drug use: No   Sexual activity: Never    Comment: BTL   Other Topics Concern   Not  on file  Social History Narrative   Lives at home with herself with dog.   Caffeine use: Drink 2 cups coffee per day   Social Determinants of Health   Financial Resource Strain: Not on file  Food Insecurity: Not on file  Transportation Needs: Not on file  Physical Activity: Not on file  Stress: Not on file  Social Connections: Not on file   Allergies  Allergen Reactions   Codeine Nausea Only   Family History  Problem Relation Age of Onset   Hyperlipidemia Mother    Hypertension Mother    Lung cancer Father    Cancer Father    Breast cancer Sister    Cancer Sister    Breast cancer Maternal Aunt    Cancer Maternal Aunt    Hyperlipidemia Maternal Grandmother    Hypertension Maternal Grandmother    Colon cancer Paternal Uncle    Cancer Paternal Uncle    Colon cancer Paternal Uncle    Diabetes Daughter     Current Outpatient Medications (Endocrine & Metabolic):    metFORMIN (GLUCOPHAGE) 500 MG tablet, Take 1,000 mg by mouth daily with breakfast.   Semaglutide (OZEMPIC, 1 MG/DOSE, [place]), Inject 1 mg into the skin once a week.  Current Outpatient Medications (Cardiovascular):    amLODipine (NORVASC) 5 MG tablet, TAKE 1 TABLET BY MOUTH EVERY  DAY   Bempedoic Acid (NEXLETOL) 180 MG TABS, Take 1 tablet by mouth daily.   Evolocumab (REPATHA SURECLICK) 774 MG/ML SOAJ, Inject 140 mg into the skin every 14 (fourteen) days.   valsartan-hydrochlorothiazide (DIOVAN-HCT) 320-25 MG tablet, TAKE 1 TABLET BY MOUTH EVERY DAY   Current Outpatient Medications (Analgesics):    aspirin 81 MG tablet, Take 81 mg by mouth daily.   Current Outpatient Medications (Other):    Blood Glucose Monitoring Suppl (ACCU-CHEK GUIDE) w/Device KIT, as directed.   COVID-19 mRNA bivalent vaccine, Pfizer, (PFIZER COVID-19 VAC BIVALENT) injection, Inject into the muscle.   gabapentin (NEURONTIN) 100 MG capsule, Take 2 capsules (200 mg total) by mouth at bedtime.   glucose blood (ACCU-CHEK GUIDE) test strip, as  directed.   LUMIGAN 0.01 % SOLN, Place 1 drop into both eyes every evening.   valACYclovir (VALTREX) 500 MG tablet, Take 200 mg by mouth as needed.   Vitamin D, Ergocalciferol, (DRISDOL) 1.25 MG (50000 UNIT) CAPS capsule, TAKE 1 CAPSULE (50,000 UNITS TOTAL) BY MOUTH EVERY 7 (SEVEN) DAYS   XIIDRA 5 % SOLN, Apply 1 drop to eye 2 (two) times daily.   Reviewed prior external information including notes and imaging from  primary care provider As well as notes that were available from care everywhere and other healthcare systems.  Past medical history, social, surgical and family history all reviewed in electronic medical record.  No pertanent information unless stated regarding to the chief complaint.   Review of Systems:  No headache, visual changes, nausea, vomiting, diarrhea, constipation, dizziness, abdominal pain, skin rash, fevers, chills, night sweats, weight loss, swollen lymph nodes, body aches, joint swelling, chest pain, shortness of breath, mood changes. POSITIVE muscle aches  Objective  Blood pressure 108/72, pulse 77, height 5' 4.5" (1.638 m), weight 164 lb (74.4 kg), SpO2 98 %.   General: No apparent distress alert and oriented x3 mood and affect normal, dressed appropriately.  HEENT: Pupils equal, extraocular movements intact  Respiratory: Patient's speak in full sentences and does not appear short of breath  Cardiovascular: No lower extremity edema, non tender, no erythema  No back exam does have some mild loss of lordosis.  Patient does have tightness with FABER test.  Tightness of the adductor's and weakness of the abductor's negative straight leg test.  Bilateral foot left greater than right has breakdown of the transverse arch noted.   Osteopathic findings  T9 extended rotated and side bent left L3 flexed rotated and side bent left Sacrum right on right     Impression and Recommendations:     The above documentation has been reviewed and is accurate and complete  Lyndal Pulley, DO

## 2022-02-08 ENCOUNTER — Ambulatory Visit: Payer: Medicare PPO | Admitting: Family Medicine

## 2022-02-08 ENCOUNTER — Encounter: Payer: Self-pay | Admitting: Family Medicine

## 2022-02-08 ENCOUNTER — Ambulatory Visit
Admission: RE | Admit: 2022-02-08 | Discharge: 2022-02-08 | Disposition: A | Discharge status: Home / Self Care | Payer: Medicare PPO | Source: Ambulatory Visit | Attending: Obstetrics and Gynecology | Admitting: Obstetrics and Gynecology

## 2022-02-08 ENCOUNTER — Ambulatory Visit: Payer: Self-pay

## 2022-02-08 VITALS — BP 108/72 | HR 77 | Ht 64.5 in | Wt 164.0 lb

## 2022-02-08 DIAGNOSIS — M79672 Pain in left foot: Secondary | ICD-10-CM

## 2022-02-08 DIAGNOSIS — M9902 Segmental and somatic dysfunction of thoracic region: Secondary | ICD-10-CM

## 2022-02-08 DIAGNOSIS — M9903 Segmental and somatic dysfunction of lumbar region: Secondary | ICD-10-CM

## 2022-02-08 DIAGNOSIS — Z1231 Encounter for screening mammogram for malignant neoplasm of breast: Secondary | ICD-10-CM

## 2022-02-08 DIAGNOSIS — G8929 Other chronic pain: Secondary | ICD-10-CM

## 2022-02-08 DIAGNOSIS — M9904 Segmental and somatic dysfunction of sacral region: Secondary | ICD-10-CM

## 2022-02-08 DIAGNOSIS — M545 Low back pain, unspecified: Secondary | ICD-10-CM

## 2022-02-08 NOTE — Patient Instructions (Signed)
Keep working on Tai chi 3 IBU 3x a day for 3 days See me in 7-8 weeks

## 2022-02-08 NOTE — Assessment & Plan Note (Signed)
Multifactorial with patient having degenerative disc disease.  Discussed with patient icing regimen and home exercise, which activities to do which ones to avoid, increase activity slowly otherwise.  Follow-up again in 6 to 8 weeks

## 2022-02-08 NOTE — Assessment & Plan Note (Signed)

## 2022-02-08 NOTE — Assessment & Plan Note (Signed)
Overall patient does have the breakdown of the transverse arch.  Discussed the different treatment activities.  We discussed which activities to do and which ones to avoid.

## 2022-02-09 ENCOUNTER — Ambulatory Visit
Admission: RE | Admit: 2022-02-09 | Discharge: 2022-02-09 | Disposition: A | Discharge status: Home / Self Care | Payer: Medicare PPO | Source: Ambulatory Visit | Attending: Obstetrics and Gynecology | Admitting: Obstetrics and Gynecology

## 2022-02-14 ENCOUNTER — Other Ambulatory Visit: Payer: Self-pay | Admitting: Obstetrics and Gynecology

## 2022-02-14 DIAGNOSIS — R928 Other abnormal and inconclusive findings on diagnostic imaging of breast: Secondary | ICD-10-CM

## 2022-02-24 ENCOUNTER — Ambulatory Visit
Admission: RE | Admit: 2022-02-24 | Discharge: 2022-02-24 | Disposition: A | Discharge status: Home / Self Care | Payer: Medicare PPO | Source: Ambulatory Visit | Attending: Obstetrics and Gynecology | Admitting: Obstetrics and Gynecology

## 2022-02-24 ENCOUNTER — Other Ambulatory Visit: Payer: Self-pay | Admitting: Obstetrics and Gynecology

## 2022-02-24 ENCOUNTER — Other Ambulatory Visit: Payer: Self-pay | Admitting: Family Medicine

## 2022-02-24 DIAGNOSIS — N6310 Unspecified lump in the right breast, unspecified quadrant: Secondary | ICD-10-CM

## 2022-02-24 DIAGNOSIS — R928 Other abnormal and inconclusive findings on diagnostic imaging of breast: Secondary | ICD-10-CM

## 2022-02-28 ENCOUNTER — Ambulatory Visit
Admission: RE | Admit: 2022-02-28 | Discharge: 2022-02-28 | Disposition: A | Discharge status: Home / Self Care | Payer: Medicare PPO | Source: Ambulatory Visit | Attending: Obstetrics and Gynecology | Admitting: Obstetrics and Gynecology

## 2022-02-28 DIAGNOSIS — Z780 Asymptomatic menopausal state: Secondary | ICD-10-CM

## 2022-03-01 ENCOUNTER — Ambulatory Visit
Admission: RE | Admit: 2022-03-01 | Discharge: 2022-03-01 | Disposition: A | Discharge status: Home / Self Care | Payer: Medicare PPO | Source: Ambulatory Visit | Attending: Obstetrics and Gynecology | Admitting: Obstetrics and Gynecology

## 2022-03-01 DIAGNOSIS — M8580 Other specified disorders of bone density and structure, unspecified site: Secondary | ICD-10-CM | POA: Insufficient documentation

## 2022-03-01 DIAGNOSIS — N6310 Unspecified lump in the right breast, unspecified quadrant: Secondary | ICD-10-CM

## 2022-03-02 DIAGNOSIS — N6310 Unspecified lump in the right breast, unspecified quadrant: Secondary | ICD-10-CM | POA: Insufficient documentation

## 2022-03-09 ENCOUNTER — Other Ambulatory Visit: Payer: Self-pay | Admitting: Interventional Cardiology

## 2022-03-18 ENCOUNTER — Encounter (HOSPITAL_COMMUNITY): Payer: Self-pay | Admitting: Emergency Medicine

## 2022-03-18 ENCOUNTER — Emergency Department (HOSPITAL_COMMUNITY): Payer: Medicare PPO

## 2022-03-18 ENCOUNTER — Other Ambulatory Visit: Payer: Self-pay

## 2022-03-18 ENCOUNTER — Emergency Department (HOSPITAL_COMMUNITY)
Admission: EM | Admit: 2022-03-18 | Discharge: 2022-03-18 | Disposition: A | Discharge status: Home / Self Care | Payer: Medicare PPO | Attending: Emergency Medicine | Admitting: Emergency Medicine

## 2022-03-18 DIAGNOSIS — Z79899 Other long term (current) drug therapy: Secondary | ICD-10-CM | POA: Insufficient documentation

## 2022-03-18 DIAGNOSIS — Z7982 Long term (current) use of aspirin: Secondary | ICD-10-CM | POA: Diagnosis not present

## 2022-03-18 DIAGNOSIS — M542 Cervicalgia: Secondary | ICD-10-CM | POA: Insufficient documentation

## 2022-03-18 DIAGNOSIS — R42 Dizziness and giddiness: Secondary | ICD-10-CM | POA: Insufficient documentation

## 2022-03-18 DIAGNOSIS — I10 Essential (primary) hypertension: Secondary | ICD-10-CM | POA: Insufficient documentation

## 2022-03-18 DIAGNOSIS — E119 Type 2 diabetes mellitus without complications: Secondary | ICD-10-CM | POA: Diagnosis not present

## 2022-03-18 DIAGNOSIS — Z7984 Long term (current) use of oral hypoglycemic drugs: Secondary | ICD-10-CM | POA: Insufficient documentation

## 2022-03-18 DIAGNOSIS — S0990XA Unspecified injury of head, initial encounter: Secondary | ICD-10-CM | POA: Insufficient documentation

## 2022-03-18 MED ORDER — METHOCARBAMOL 500 MG PO TABS: 1000.0000 mg | ORAL_TABLET | Freq: Every evening | ORAL | 0 refills | Status: DC | PRN
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
## 2022-03-18 NOTE — Discharge Instructions (Addendum)
Based on the events which brought you to the ER today, it is possible that you may have a concussion. A concussion occurs when there is a blow to the head or body, with enough force to shake the brain and disrupt how the brain functions. You may experience symptoms such as headaches, sensitivity to light/noise, dizziness, cognitive slowing, difficulty concentrating / remembering, trouble sleeping and drowsiness. These symptoms may last anywhere from hours/days to potentially weeks/months. While these symptoms are very frustrating and perhaps debilitating, it is important that you remember that they will improve over time. Everyone has a different rate of recovery; it is difficult to predict when your symptoms will resolve. In order to allow for your brain to heal after the injury, we recommend that you see your primary physician or a physician knowledgeable in concussion management. We also advise you to let your body and brain rest: avoid physical activities (sports, gym, and exercise) and reduce cognitive demands (reading, texting, TV watching, computer use, video games, etc). School attendance, after-school activities and work may need to be modified to avoid increasing symptoms. We recommend against driving until until all symptoms have resolved. Come back to the ER right away if you are having repeated episodes of vomiting, severe/worsening headache/dizziness or any other symptom that alarms you. We recommended that someone stay with you for the next 24 hours to monitor for these worrisome symptoms.   It was a pleasure caring for you today in the emergency department.  Please return to the emergency department for any worsening or worrisome symptoms.  
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
## 2022-03-22 ENCOUNTER — Ambulatory Visit (INDEPENDENT_AMBULATORY_CARE_PROVIDER_SITE_OTHER): Payer: Medicare PPO

## 2022-03-22 ENCOUNTER — Other Ambulatory Visit: Payer: Self-pay | Admitting: Family Medicine

## 2022-03-22 ENCOUNTER — Ambulatory Visit: Payer: Medicare PPO | Admitting: Sports Medicine

## 2022-03-22 VITALS — BP 128/80 | HR 80 | Ht 64.0 in | Wt 164.0 lb

## 2022-03-22 DIAGNOSIS — M545 Low back pain, unspecified: Secondary | ICD-10-CM | POA: Diagnosis not present

## 2022-03-22 DIAGNOSIS — S060X0S Concussion without loss of consciousness, sequela: Secondary | ICD-10-CM | POA: Diagnosis not present

## 2022-03-22 DIAGNOSIS — S060X0A Concussion without loss of consciousness, initial encounter: Secondary | ICD-10-CM | POA: Diagnosis not present

## 2022-03-22 DIAGNOSIS — R270 Ataxia, unspecified: Secondary | ICD-10-CM | POA: Diagnosis not present

## 2022-03-22 DIAGNOSIS — G8929 Other chronic pain: Secondary | ICD-10-CM

## 2022-03-22 DIAGNOSIS — R479 Unspecified speech disturbances: Secondary | ICD-10-CM | POA: Diagnosis not present

## 2022-03-22 NOTE — Patient Instructions (Addendum)
Good to see you  - Complete mental and physical rest for 48 hours after concussive event -           Recommend light aerobic activity while keeping symptoms less than 3/10 -           Stop mental or physical activities that cause symptoms to worsen greater than 3/10, and wait 24 hours before attempting them again -           Eliminate screen time as much as possible for first 48 hours after concussive event, then continue limited screen time (recommend less than 2 hours per day)  xray's on the way out  Vestibular therapy referral  1 week follow up
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
## 2022-03-29 ENCOUNTER — Ambulatory Visit: Payer: Medicare PPO | Admitting: Family Medicine

## 2022-03-29 ENCOUNTER — Ambulatory Visit: Payer: Medicare PPO | Admitting: Sports Medicine

## 2022-03-29 VITALS — Ht 64.0 in

## 2022-03-29 VITALS — BP 116/76 | HR 73 | Ht 64.0 in | Wt 168.0 lb

## 2022-03-29 DIAGNOSIS — G8929 Other chronic pain: Secondary | ICD-10-CM

## 2022-03-29 DIAGNOSIS — S060X0S Concussion without loss of consciousness, sequela: Secondary | ICD-10-CM | POA: Diagnosis not present

## 2022-03-29 DIAGNOSIS — M545 Low back pain, unspecified: Secondary | ICD-10-CM

## 2022-03-29 DIAGNOSIS — R479 Unspecified speech disturbances: Secondary | ICD-10-CM | POA: Diagnosis not present

## 2022-03-29 DIAGNOSIS — R270 Ataxia, unspecified: Secondary | ICD-10-CM

## 2022-03-29 DIAGNOSIS — S134XXA Sprain of ligaments of cervical spine, initial encounter: Secondary | ICD-10-CM

## 2022-03-29 MED ORDER — TIZANIDINE HCL 4 MG PO TABS: 4.0000 mg | ORAL_TABLET | Freq: Every day | ORAL | 0 refills | Status: DC

## 2022-03-29 NOTE — Patient Instructions (Addendum)
Good to see you  Speech therapy referral  2 week follow up

## 2022-03-29 NOTE — Patient Instructions (Addendum)
Zanaflex 4mg  at night PT at [facility] 3 ibuprofen 3 times a day for 3 days  Careful with ninja training

## 2022-03-29 NOTE — Assessment & Plan Note (Signed)
Patient does have significant tightness.  Unable to do osteopathic manipulation secondary to the discomfort and tightness.  Seems to be mostly in the lumbar area but is having some in the cervical area as well.  Discussed medications including gabapentin 200 mg at night for this new problem that is causing an exacerbation of her chronic back pain as well.  Zanaflex, 4 mg at night I think will be helpful as well with patient having difficulty with sleep.  We will start formal physical therapy and hopefully other modalities can be helpful as well and did do some aquatic therapy because I do feel like the lower impact might be better at this moment.  Follow-up with me again in 3 to 4 weeks to make sure patient is responding well.

## 2022-04-03 ENCOUNTER — Telehealth: Payer: Self-pay | Admitting: Sports Medicine

## 2022-04-03 NOTE — Telephone Encounter (Signed)
Not sure if this was for vestibular or not but if it was, I do not think this is offered at [facility]

## 2022-04-03 NOTE — Addendum Note (Signed)
Addended by: Pollyann Glen on: 04/03/2022 03:49 PM   Modules accepted: Orders

## 2022-04-03 NOTE — Addendum Note (Signed)
Addended by: Carmie Kanner on: 04/03/2022 01:52 PM   Modules accepted: Orders

## 2022-04-03 NOTE — Telephone Encounter (Signed)
Changing to [place] pt

## 2022-04-03 NOTE — Telephone Encounter (Signed)
Pt went to [facility] today for post concussion treatment and was not happy with their services.  She is already seeing [facility] for speech and would like her concussion treatment there as well.

## 2022-04-04 ENCOUNTER — Ambulatory Visit (HOSPITAL_BASED_OUTPATIENT_CLINIC_OR_DEPARTMENT_OTHER): Payer: Medicare PPO | Attending: Family Medicine | Admitting: Physical Therapy

## 2022-04-04 ENCOUNTER — Encounter (HOSPITAL_BASED_OUTPATIENT_CLINIC_OR_DEPARTMENT_OTHER): Payer: Self-pay | Admitting: Physical Therapy

## 2022-04-04 DIAGNOSIS — M545 Low back pain, unspecified: Secondary | ICD-10-CM | POA: Diagnosis not present

## 2022-04-04 DIAGNOSIS — S134XXA Sprain of ligaments of cervical spine, initial encounter: Secondary | ICD-10-CM | POA: Insufficient documentation

## 2022-04-04 DIAGNOSIS — G8929 Other chronic pain: Secondary | ICD-10-CM | POA: Diagnosis not present

## 2022-04-04 DIAGNOSIS — M5459 Other low back pain: Secondary | ICD-10-CM

## 2022-04-04 DIAGNOSIS — M542 Cervicalgia: Secondary | ICD-10-CM

## 2022-04-04 DIAGNOSIS — R2689 Other abnormalities of gait and mobility: Secondary | ICD-10-CM
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
## 2022-04-10 ENCOUNTER — Encounter (HOSPITAL_BASED_OUTPATIENT_CLINIC_OR_DEPARTMENT_OTHER): Payer: Self-pay | Admitting: Physical Therapy

## 2022-04-10 ENCOUNTER — Ambulatory Visit (HOSPITAL_BASED_OUTPATIENT_CLINIC_OR_DEPARTMENT_OTHER): Payer: Medicare PPO | Admitting: Physical Therapy

## 2022-04-10 DIAGNOSIS — S134XXA Sprain of ligaments of cervical spine, initial encounter: Secondary | ICD-10-CM | POA: Diagnosis not present

## 2022-04-10 DIAGNOSIS — M5459 Other low back pain: Secondary | ICD-10-CM

## 2022-04-10 DIAGNOSIS — M542 Cervicalgia: Secondary | ICD-10-CM
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
## 2022-04-12 ENCOUNTER — Ambulatory Visit: Payer: Medicare PPO | Admitting: Sports Medicine

## 2022-04-12 ENCOUNTER — Ambulatory Visit (HOSPITAL_BASED_OUTPATIENT_CLINIC_OR_DEPARTMENT_OTHER): Payer: Medicare PPO | Attending: Family Medicine | Admitting: Physical Therapy

## 2022-04-12 ENCOUNTER — Encounter (HOSPITAL_BASED_OUTPATIENT_CLINIC_OR_DEPARTMENT_OTHER): Payer: Self-pay | Admitting: Physical Therapy

## 2022-04-12 VITALS — BP 126/82 | HR 82 | Ht 64.0 in | Wt 168.0 lb

## 2022-04-12 DIAGNOSIS — S060X0S Concussion without loss of consciousness, sequela: Secondary | ICD-10-CM | POA: Diagnosis not present

## 2022-04-12 DIAGNOSIS — M542 Cervicalgia: Secondary | ICD-10-CM | POA: Insufficient documentation

## 2022-04-12 DIAGNOSIS — S060X0A Concussion without loss of consciousness, initial encounter: Secondary | ICD-10-CM | POA: Diagnosis not present

## 2022-04-12 DIAGNOSIS — R479 Unspecified speech disturbances: Secondary | ICD-10-CM

## 2022-04-12 DIAGNOSIS — R2689 Other abnormalities of gait and mobility: Secondary | ICD-10-CM | POA: Insufficient documentation

## 2022-04-12 DIAGNOSIS — R270 Ataxia, unspecified: Secondary | ICD-10-CM

## 2022-04-12 DIAGNOSIS — M5459 Other low back pain: Secondary | ICD-10-CM | POA: Diagnosis not present

## 2022-04-12 NOTE — Patient Instructions (Addendum)
Good to see you  Continue all the PT We discussed amitriptyline we will not start today but could consider starting at follow up  2 week follow up
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
## 2022-04-13 ENCOUNTER — Ambulatory Visit: Payer: Medicare PPO | Attending: Sports Medicine

## 2022-04-13 ENCOUNTER — Other Ambulatory Visit: Payer: Self-pay

## 2022-04-13 DIAGNOSIS — R482 Apraxia: Secondary | ICD-10-CM | POA: Diagnosis not present

## 2022-04-13 DIAGNOSIS — R4701 Aphasia: Secondary | ICD-10-CM | POA: Insufficient documentation

## 2022-04-13 NOTE — Addendum Note (Signed)
Addended by: Garald Balding B on: 04/13/2022 10:50 PM   Modules accepted: Orders

## 2022-04-13 NOTE — Patient Instructions (Signed)
   Speech Exercises  Repeat 3 times, 2-3 times a day  Call the cat "Buttercup" A calendar of [place], [place] Four floors to cover Yellow oil ointment Fellow lovers of felines Catastrophe in [place]' plums The church's chimes chimed Telling time 'til eleven Five valve levers Keep the gate closed Go see that guy Fat cows give milk [employer] Gophers Fat frogs flip freely Kohl's into bed Get that game to [employer] Thick thistles stick together Cinnamon aluminum linoleum Black bugs blood Lovely lemon linament Red leather, yellow leather  Big grocery buggy    Purple baby carriage [facility] Proper copper coffee pot Ripe purple cabbage Three free throws [employer] tackled  [employer] dipped the dessert  Duke Boyd that [employer] of [employer] Shirts shrink, shells shouldn't [employer] Take the tackle box File the flash message Give me five flapjacks Fundamental relatives Dye the pets purple Talking [place] time after time Dark chocolate chunks Political landscape of the kingdom Estate manager/land agent genius We played yo-yos yesterday  =================================== Apraxia of Speech in Adults [email or web address]   <- go to this site and type "apraxia of speech in adults" in the search field (upper right hand corner)  Aphasia [email or web address] <- go to this site and type "aphasia" into the search field
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
## 2022-04-17 ENCOUNTER — Ambulatory Visit (HOSPITAL_BASED_OUTPATIENT_CLINIC_OR_DEPARTMENT_OTHER): Payer: Medicare PPO | Admitting: Physical Therapy

## 2022-04-17 ENCOUNTER — Encounter (HOSPITAL_BASED_OUTPATIENT_CLINIC_OR_DEPARTMENT_OTHER): Payer: Self-pay | Admitting: Physical Therapy

## 2022-04-17 DIAGNOSIS — M5459 Other low back pain: Secondary | ICD-10-CM

## 2022-04-17 DIAGNOSIS — R2689 Other abnormalities of gait and mobility: Secondary | ICD-10-CM | POA: Diagnosis not present

## 2022-04-17 DIAGNOSIS — M542 Cervicalgia: Secondary | ICD-10-CM

## 2022-04-17 DIAGNOSIS — R42 Dizziness and giddiness: Secondary | ICD-10-CM | POA: Diagnosis not present
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
## 2022-04-18 ENCOUNTER — Ambulatory Visit: Payer: Medicare PPO

## 2022-04-18 DIAGNOSIS — R4701 Aphasia: Secondary | ICD-10-CM | POA: Diagnosis not present

## 2022-04-18 DIAGNOSIS — R482 Apraxia: Secondary | ICD-10-CM
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
## 2022-04-19 DIAGNOSIS — R42 Dizziness and giddiness: Secondary | ICD-10-CM | POA: Diagnosis not present

## 2022-04-20 ENCOUNTER — Other Ambulatory Visit: Payer: Self-pay | Admitting: Family Medicine

## 2022-04-20 ENCOUNTER — Encounter (HOSPITAL_BASED_OUTPATIENT_CLINIC_OR_DEPARTMENT_OTHER): Payer: Self-pay | Admitting: Physical Therapy

## 2022-04-20 ENCOUNTER — Ambulatory Visit (HOSPITAL_BASED_OUTPATIENT_CLINIC_OR_DEPARTMENT_OTHER): Payer: Medicare PPO | Admitting: Physical Therapy

## 2022-04-20 DIAGNOSIS — M5459 Other low back pain: Secondary | ICD-10-CM | POA: Diagnosis not present

## 2022-04-20 DIAGNOSIS — M542 Cervicalgia: Secondary | ICD-10-CM

## 2022-04-20 DIAGNOSIS — R2689 Other abnormalities of gait and mobility: Secondary | ICD-10-CM
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
## 2022-04-21 DIAGNOSIS — E785 Hyperlipidemia, unspecified: Secondary | ICD-10-CM | POA: Diagnosis not present

## 2022-04-21 DIAGNOSIS — M62838 Other muscle spasm: Secondary | ICD-10-CM | POA: Diagnosis not present

## 2022-04-21 DIAGNOSIS — I2510 Atherosclerotic heart disease of native coronary artery without angina pectoris: Secondary | ICD-10-CM | POA: Diagnosis not present

## 2022-04-21 DIAGNOSIS — E119 Type 2 diabetes mellitus without complications: Secondary | ICD-10-CM | POA: Diagnosis not present

## 2022-04-21 DIAGNOSIS — E569 Vitamin deficiency, unspecified: Secondary | ICD-10-CM | POA: Diagnosis not present

## 2022-04-21 DIAGNOSIS — Z7982 Long term (current) use of aspirin: Secondary | ICD-10-CM | POA: Diagnosis not present

## 2022-04-21 DIAGNOSIS — H409 Unspecified glaucoma: Secondary | ICD-10-CM | POA: Diagnosis not present

## 2022-04-21 DIAGNOSIS — M1990 Unspecified osteoarthritis, unspecified site: Secondary | ICD-10-CM | POA: Diagnosis not present

## 2022-04-21 DIAGNOSIS — I10 Essential (primary) hypertension: Secondary | ICD-10-CM | POA: Diagnosis not present
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
## 2022-04-24 ENCOUNTER — Ambulatory Visit (HOSPITAL_BASED_OUTPATIENT_CLINIC_OR_DEPARTMENT_OTHER): Payer: Medicare PPO | Admitting: Physical Therapy

## 2022-04-24 ENCOUNTER — Encounter (HOSPITAL_BASED_OUTPATIENT_CLINIC_OR_DEPARTMENT_OTHER): Payer: Self-pay | Admitting: Physical Therapy

## 2022-04-24 DIAGNOSIS — R2689 Other abnormalities of gait and mobility: Secondary | ICD-10-CM | POA: Diagnosis not present

## 2022-04-24 DIAGNOSIS — R42 Dizziness and giddiness: Secondary | ICD-10-CM | POA: Diagnosis not present

## 2022-04-24 DIAGNOSIS — M5459 Other low back pain: Secondary | ICD-10-CM

## 2022-04-24 DIAGNOSIS — M542 Cervicalgia: Secondary | ICD-10-CM | POA: Diagnosis not present
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
## 2022-04-25 ENCOUNTER — Ambulatory Visit: Payer: Medicare PPO

## 2022-04-25 ENCOUNTER — Ambulatory Visit: Payer: Medicare PPO | Admitting: Sports Medicine

## 2022-04-25 VITALS — BP 124/80 | HR 75 | Ht 64.0 in | Wt 168.0 lb

## 2022-04-25 DIAGNOSIS — R479 Unspecified speech disturbances: Secondary | ICD-10-CM | POA: Diagnosis not present

## 2022-04-25 DIAGNOSIS — R270 Ataxia, unspecified: Secondary | ICD-10-CM

## 2022-04-25 DIAGNOSIS — R4701 Aphasia: Secondary | ICD-10-CM

## 2022-04-25 DIAGNOSIS — S060X0S Concussion without loss of consciousness, sequela: Secondary | ICD-10-CM

## 2022-04-25 DIAGNOSIS — R482 Apraxia: Secondary | ICD-10-CM

## 2022-04-25 DIAGNOSIS — S060X0A Concussion without loss of consciousness, initial encounter: Secondary | ICD-10-CM
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
## 2022-04-25 NOTE — Patient Instructions (Addendum)
Good to see you  Continue PT  2-4 week follow up

## 2022-04-26 ENCOUNTER — Ambulatory Visit (HOSPITAL_BASED_OUTPATIENT_CLINIC_OR_DEPARTMENT_OTHER): Payer: Medicare PPO | Admitting: Physical Therapy

## 2022-04-26 ENCOUNTER — Encounter (HOSPITAL_BASED_OUTPATIENT_CLINIC_OR_DEPARTMENT_OTHER): Payer: Self-pay | Admitting: Physical Therapy

## 2022-04-26 DIAGNOSIS — R2689 Other abnormalities of gait and mobility: Secondary | ICD-10-CM

## 2022-04-26 DIAGNOSIS — M542 Cervicalgia: Secondary | ICD-10-CM

## 2022-04-26 DIAGNOSIS — M5459 Other low back pain: Secondary | ICD-10-CM | POA: Diagnosis not present
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
## 2022-04-27 DIAGNOSIS — R42 Dizziness and giddiness: Secondary | ICD-10-CM | POA: Diagnosis not present

## 2022-05-01 DIAGNOSIS — R42 Dizziness and giddiness: Secondary | ICD-10-CM | POA: Diagnosis not present

## 2022-05-02 ENCOUNTER — Ambulatory Visit (HOSPITAL_BASED_OUTPATIENT_CLINIC_OR_DEPARTMENT_OTHER): Payer: Medicare PPO | Admitting: Physical Therapy

## 2022-05-02 ENCOUNTER — Encounter (HOSPITAL_BASED_OUTPATIENT_CLINIC_OR_DEPARTMENT_OTHER): Payer: Self-pay | Admitting: Physical Therapy

## 2022-05-02 ENCOUNTER — Ambulatory Visit: Payer: Medicare PPO

## 2022-05-02 DIAGNOSIS — R2689 Other abnormalities of gait and mobility: Secondary | ICD-10-CM

## 2022-05-02 DIAGNOSIS — R4701 Aphasia: Secondary | ICD-10-CM | POA: Diagnosis not present

## 2022-05-02 DIAGNOSIS — R482 Apraxia: Secondary | ICD-10-CM

## 2022-05-02 DIAGNOSIS — M5459 Other low back pain: Secondary | ICD-10-CM | POA: Diagnosis not present

## 2022-05-02 DIAGNOSIS — M542 Cervicalgia: Secondary | ICD-10-CM | POA: Diagnosis not present
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
## 2022-05-03 DIAGNOSIS — R42 Dizziness and giddiness: Secondary | ICD-10-CM | POA: Diagnosis not present

## 2022-05-09 ENCOUNTER — Ambulatory Visit (HOSPITAL_BASED_OUTPATIENT_CLINIC_OR_DEPARTMENT_OTHER): Payer: Medicare PPO | Admitting: Physical Therapy

## 2022-05-09 ENCOUNTER — Ambulatory Visit: Payer: Medicare PPO

## 2022-05-09 ENCOUNTER — Encounter (HOSPITAL_BASED_OUTPATIENT_CLINIC_OR_DEPARTMENT_OTHER): Payer: Self-pay | Admitting: Physical Therapy

## 2022-05-09 ENCOUNTER — Other Ambulatory Visit: Payer: Self-pay | Admitting: Internal Medicine

## 2022-05-09 DIAGNOSIS — R482 Apraxia: Secondary | ICD-10-CM

## 2022-05-09 DIAGNOSIS — E7849 Other hyperlipidemia: Secondary | ICD-10-CM

## 2022-05-09 DIAGNOSIS — M5459 Other low back pain: Secondary | ICD-10-CM | POA: Diagnosis not present

## 2022-05-09 DIAGNOSIS — M542 Cervicalgia: Secondary | ICD-10-CM | POA: Diagnosis not present

## 2022-05-09 DIAGNOSIS — R2689 Other abnormalities of gait and mobility: Secondary | ICD-10-CM

## 2022-05-09 DIAGNOSIS — R4701 Aphasia: Secondary | ICD-10-CM | POA: Diagnosis not present

## 2022-05-09 DIAGNOSIS — E7801 Familial hypercholesterolemia: Secondary | ICD-10-CM

## 2022-05-09 DIAGNOSIS — I2510 Atherosclerotic heart disease of native coronary artery without angina pectoris: Secondary | ICD-10-CM

## 2022-05-09 NOTE — Progress Notes (Unsigned)
[facility] Phone: [phone] Subjective:   Fontaine No, am serving as a scribe for Dr. Hulan Saas.  I'm seeing this patient by the request  of:  Aura Dials, MD  CC: back and neck pain follow up   UVO:[id number]  03/29/2022 Patient does have significant tightness.  Unable to do osteopathic manipulation secondary to the discomfort and tightness.  Seems to be mostly in the lumbar area but is having some in the cervical area as well.  Discussed medications including gabapentin 200 mg at night for this new problem that is causing an exacerbation of her chronic back pain as well.  Zanaflex, 4 mg at night I think will be helpful as well with patient having difficulty with sleep.  We will start formal physical therapy and hopefully other modalities can be helpful as well and did do some aquatic therapy because I do feel like the lower impact might be better at this moment.  Follow-up with me again in 3 to 4 weeks to make sure patient is responding well.      Update 05/11/2022 Ivyrose Hashman is a 71 y.o. female coming in with complaint of cervical and lumbar spine pain. Patient states that she is doing better since last visit. Having less vestibular symptoms. Does have soreness in both traps.   Lower back pain is manageable. Mowed the grass around Thanksgiving and had to use a muscle relaxer that evening.        Past Medical History:  Diagnosis Date   Diabetes mellitus without complication ([place])    HSV infection    Hyperlipidemia    Hypertension    Past Surgical History:  Procedure Laterality Date   TOE SURGERY  1996   TUBAL LIGATION     WISDOM TOOTH EXTRACTION     Social History   Socioeconomic History   Marital status: Divorced    Spouse name: Not on file   Number of children: 2   Years of education: 16+   Highest education level: Not on file  Occupational History   Not on file  Tobacco  Use   Smoking status: Never   Smokeless tobacco: Never  Substance and Sexual Activity   Alcohol use: Yes    Alcohol/week: 0.0 standard drinks of alcohol    Comment: Wine occass   Drug use: No   Sexual activity: Never    Comment: BTL   Other Topics Concern   Not on file  Social History Narrative   Lives at home with herself with dog.   Caffeine use: Drink 2 cups coffee per day   Social Determinants of Health   Financial Resource Strain: Not on file  Food Insecurity: Not on file  Transportation Needs: Not on file  Physical Activity: Not on file  Stress: Not on file  Social Connections: Not on file   Allergies  Allergen Reactions   Codeine Nausea Only   Family History  Problem Relation Age of Onset   Hyperlipidemia Mother    Hypertension Mother    Lung cancer Father    Cancer Father    Breast cancer Sister    Cancer Sister    Breast cancer Maternal Aunt    Cancer Maternal Aunt    Hyperlipidemia Maternal Grandmother    Hypertension Maternal Grandmother    Colon cancer Paternal Uncle    Cancer Paternal Uncle    Colon cancer Paternal Uncle    Diabetes Daughter  Current Outpatient Medications (Endocrine & Metabolic):    metFORMIN (GLUCOPHAGE) 500 MG tablet, Take 1,000 mg by mouth daily with breakfast.   Semaglutide (OZEMPIC, 1 MG/DOSE, [place]), Inject 1 mg into the skin once a week.  Current Outpatient Medications (Cardiovascular):    amLODipine (NORVASC) 5 MG tablet, TAKE 1 TABLET BY MOUTH EVERY DAY   Bempedoic Acid (NEXLETOL) 180 MG TABS, Take 1 tablet by mouth daily.   Evolocumab (REPATHA SURECLICK) 321 MG/ML SOAJ, INJECT 140 MG INTO THE SKIN EVERY 14 DAYS.   valsartan-hydrochlorothiazide (DIOVAN-HCT) 320-25 MG tablet, TAKE 1 TABLET BY MOUTH EVERY DAY   Current Outpatient Medications (Analgesics):    aspirin 81 MG tablet, Take 81 mg by mouth daily.   Current Outpatient Medications (Other):    Blood Glucose Monitoring Suppl (ACCU-CHEK GUIDE) w/Device KIT, as  directed.   COVID-19 mRNA bivalent vaccine, Pfizer, (PFIZER COVID-19 VAC BIVALENT) injection, Inject into the muscle.   gabapentin (NEURONTIN) 100 MG capsule, Take 2 capsules (200 mg total) by mouth at bedtime.   glucose blood (ACCU-CHEK GUIDE) test strip, as directed.   LUMIGAN 0.01 % SOLN, Place 1 drop into both eyes every evening.   methocarbamol (ROBAXIN) 500 MG tablet, Take 2 tablets (1,000 mg total) by mouth at bedtime as needed for muscle spasms.   tiZANidine (ZANAFLEX) 4 MG tablet, TAKE 1 TABLET BY MOUTH AT BEDTIME.   valACYclovir (VALTREX) 500 MG tablet, Take 200 mg by mouth as needed.   Vitamin D, Ergocalciferol, (DRISDOL) 1.25 MG (50000 UNIT) CAPS capsule, TAKE 1 CAPSULE (50,000 UNITS TOTAL) BY MOUTH EVERY 7 (SEVEN) DAYS   XIIDRA 5 % SOLN, Apply 1 drop to eye 2 (two) times daily.   Reviewed prior external information including notes and imaging from  primary care provider As well as notes that were available from care everywhere and other healthcare systems.  Past medical history, social, surgical and family history all reviewed in electronic medical record.  No pertanent information unless stated regarding to the chief complaint.   Review of Systems:  No headache, visual changes, nausea, vomiting, diarrhea, constipation, dizziness, abdominal pain, skin rash, fevers, chills, night sweats, weight loss, swollen lymph nodes, body aches, joint swelling, chest pain, shortness of breath, mood changes. POSITIVE muscle aches  Objective  Blood pressure 122/74, pulse 73, height _0  (1.626 m), weight 170 lb (77.1 kg), SpO2 98 %.   General: No apparent distress alert and oriented x3 mood and affect normal, dressed appropriately.  HEENT: Pupils equal, extraocular movements intact  Respiratory: Patient's speak in full sentences and does not appear short of breath  Cardiovascular: No lower extremity edema, non tender, no erythema  Low back exam does have some loss lordosis.  Still has some  difficulty with any type of range of motion and is still has limitation in all planes.  Neck exam does have some loss of lordosis as well.  Still some mild dizziness with change in positions.   Osteopathic findings  C4 flexed rotated and side bent left T3 extended rotated and side bent right inhaled third rib L2 flexed rotated and side bent right L5 flexed rotated and side bent left Sacrum right on right    Impression and Recommendations:     The above documentation has been reviewed and is accurate and complete Lyndal Pulley, DO
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
## 2022-05-10 DIAGNOSIS — R42 Dizziness and giddiness: Secondary | ICD-10-CM | POA: Diagnosis not present

## 2022-05-11 ENCOUNTER — Ambulatory Visit: Payer: Medicare PPO | Admitting: Family Medicine

## 2022-05-11 ENCOUNTER — Encounter (HOSPITAL_BASED_OUTPATIENT_CLINIC_OR_DEPARTMENT_OTHER): Payer: Self-pay | Admitting: Physical Therapy

## 2022-05-11 ENCOUNTER — Ambulatory Visit: Payer: Medicare PPO

## 2022-05-11 ENCOUNTER — Ambulatory Visit (HOSPITAL_BASED_OUTPATIENT_CLINIC_OR_DEPARTMENT_OTHER): Payer: Medicare PPO | Admitting: Physical Therapy

## 2022-05-11 ENCOUNTER — Encounter: Payer: Self-pay | Admitting: Family Medicine

## 2022-05-11 VITALS — BP 122/74 | HR 73 | Ht 64.0 in | Wt 170.0 lb

## 2022-05-11 DIAGNOSIS — M9902 Segmental and somatic dysfunction of thoracic region: Secondary | ICD-10-CM

## 2022-05-11 DIAGNOSIS — M9908 Segmental and somatic dysfunction of rib cage: Secondary | ICD-10-CM

## 2022-05-11 DIAGNOSIS — M542 Cervicalgia: Secondary | ICD-10-CM

## 2022-05-11 DIAGNOSIS — M9903 Segmental and somatic dysfunction of lumbar region: Secondary | ICD-10-CM | POA: Diagnosis not present

## 2022-05-11 DIAGNOSIS — M5459 Other low back pain: Secondary | ICD-10-CM | POA: Diagnosis not present

## 2022-05-11 DIAGNOSIS — M9904 Segmental and somatic dysfunction of sacral region: Secondary | ICD-10-CM | POA: Diagnosis not present

## 2022-05-11 DIAGNOSIS — M9901 Segmental and somatic dysfunction of cervical region: Secondary | ICD-10-CM | POA: Diagnosis not present

## 2022-05-11 DIAGNOSIS — R2689 Other abnormalities of gait and mobility: Secondary | ICD-10-CM

## 2022-05-11 DIAGNOSIS — M545 Low back pain, unspecified: Secondary | ICD-10-CM

## 2022-05-11 DIAGNOSIS — S134XXA Sprain of ligaments of cervical spine, initial encounter: Secondary | ICD-10-CM

## 2022-05-11 DIAGNOSIS — G8929 Other chronic pain: Secondary | ICD-10-CM | POA: Diagnosis not present
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
## 2022-05-11 NOTE — Assessment & Plan Note (Signed)
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
## 2022-05-11 NOTE — Patient Instructions (Signed)
Good to see you Make sure [place] spoils you for Christmas Continue PT See me in 5-6 weeks

## 2022-05-15 ENCOUNTER — Ambulatory Visit: Payer: Medicare PPO | Attending: Sports Medicine

## 2022-05-15 DIAGNOSIS — R4701 Aphasia: Secondary | ICD-10-CM | POA: Insufficient documentation

## 2022-05-15 DIAGNOSIS — R482 Apraxia: Secondary | ICD-10-CM | POA: Insufficient documentation

## 2022-05-15 DIAGNOSIS — R42 Dizziness and giddiness: Secondary | ICD-10-CM | POA: Diagnosis not present
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
## 2022-05-16 ENCOUNTER — Ambulatory Visit: Payer: Medicare PPO | Admitting: Sports Medicine

## 2022-05-16 ENCOUNTER — Ambulatory Visit (HOSPITAL_BASED_OUTPATIENT_CLINIC_OR_DEPARTMENT_OTHER): Payer: Medicare PPO | Admitting: Physical Therapy

## 2022-05-16 VITALS — HR 79 | Ht 64.0 in | Wt 170.0 lb

## 2022-05-16 DIAGNOSIS — R479 Unspecified speech disturbances: Secondary | ICD-10-CM | POA: Diagnosis not present

## 2022-05-16 DIAGNOSIS — M545 Low back pain, unspecified: Secondary | ICD-10-CM

## 2022-05-16 DIAGNOSIS — G8929 Other chronic pain: Secondary | ICD-10-CM

## 2022-05-16 DIAGNOSIS — S060X0A Concussion without loss of consciousness, initial encounter: Secondary | ICD-10-CM | POA: Diagnosis not present

## 2022-05-16 DIAGNOSIS — S060X0S Concussion without loss of consciousness, sequela: Secondary | ICD-10-CM | POA: Diagnosis not present

## 2022-05-16 DIAGNOSIS — R270 Ataxia, unspecified: Secondary | ICD-10-CM

## 2022-05-16 NOTE — Patient Instructions (Addendum)
Good to see you  Continue all the therapies  2-3 week follow up

## 2022-05-17 DIAGNOSIS — Z833 Family history of diabetes mellitus: Secondary | ICD-10-CM | POA: Diagnosis not present

## 2022-05-17 DIAGNOSIS — Z7985 Long-term (current) use of injectable non-insulin antidiabetic drugs: Secondary | ICD-10-CM | POA: Diagnosis not present

## 2022-05-17 DIAGNOSIS — E119 Type 2 diabetes mellitus without complications: Secondary | ICD-10-CM | POA: Diagnosis not present

## 2022-05-18 ENCOUNTER — Encounter (HOSPITAL_BASED_OUTPATIENT_CLINIC_OR_DEPARTMENT_OTHER): Payer: Medicare PPO | Admitting: Physical Therapy

## 2022-05-18 DIAGNOSIS — R42 Dizziness and giddiness: Secondary | ICD-10-CM | POA: Diagnosis not present

## 2022-05-23 ENCOUNTER — Ambulatory Visit (HOSPITAL_BASED_OUTPATIENT_CLINIC_OR_DEPARTMENT_OTHER): Payer: Medicare PPO | Attending: Family Medicine | Admitting: Physical Therapy

## 2022-05-23 ENCOUNTER — Encounter (HOSPITAL_BASED_OUTPATIENT_CLINIC_OR_DEPARTMENT_OTHER): Payer: Self-pay | Admitting: Physical Therapy

## 2022-05-23 DIAGNOSIS — M5459 Other low back pain: Secondary | ICD-10-CM | POA: Insufficient documentation

## 2022-05-23 DIAGNOSIS — M542 Cervicalgia: Secondary | ICD-10-CM | POA: Insufficient documentation

## 2022-05-23 DIAGNOSIS — R2689 Other abnormalities of gait and mobility: Secondary | ICD-10-CM | POA: Insufficient documentation
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
## 2022-05-24 DIAGNOSIS — R42 Dizziness and giddiness: Secondary | ICD-10-CM | POA: Diagnosis not present

## 2022-05-25 ENCOUNTER — Encounter (HOSPITAL_BASED_OUTPATIENT_CLINIC_OR_DEPARTMENT_OTHER): Payer: Self-pay | Admitting: Physical Therapy

## 2022-05-25 ENCOUNTER — Other Ambulatory Visit (HOSPITAL_BASED_OUTPATIENT_CLINIC_OR_DEPARTMENT_OTHER): Payer: Self-pay

## 2022-05-25 ENCOUNTER — Ambulatory Visit (HOSPITAL_BASED_OUTPATIENT_CLINIC_OR_DEPARTMENT_OTHER): Payer: Medicare PPO | Admitting: Physical Therapy

## 2022-05-25 DIAGNOSIS — M542 Cervicalgia: Secondary | ICD-10-CM

## 2022-05-25 DIAGNOSIS — R2689 Other abnormalities of gait and mobility: Secondary | ICD-10-CM

## 2022-05-25 DIAGNOSIS — M5459 Other low back pain: Secondary | ICD-10-CM | POA: Diagnosis not present

## 2022-05-25 MED ORDER — COMIRNATY 30 MCG/0.3ML IM SUSY
PREFILLED_SYRINGE | INTRAMUSCULAR | 0 refills | Status: DC
  Filled 2022-05-25: qty 0.3, 1d supply, fill #0
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
## 2022-05-29 ENCOUNTER — Ambulatory Visit: Payer: Medicare PPO

## 2022-05-29 DIAGNOSIS — R4701 Aphasia: Secondary | ICD-10-CM | POA: Diagnosis not present

## 2022-05-29 DIAGNOSIS — R482 Apraxia: Secondary | ICD-10-CM | POA: Diagnosis not present
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
## 2022-05-30 ENCOUNTER — Encounter (HOSPITAL_BASED_OUTPATIENT_CLINIC_OR_DEPARTMENT_OTHER): Payer: Medicare PPO | Admitting: Physical Therapy

## 2022-05-31 DIAGNOSIS — R42 Dizziness and giddiness: Secondary | ICD-10-CM | POA: Diagnosis not present

## 2022-06-01 ENCOUNTER — Encounter (HOSPITAL_BASED_OUTPATIENT_CLINIC_OR_DEPARTMENT_OTHER): Payer: Self-pay | Admitting: Physical Therapy

## 2022-06-01 ENCOUNTER — Ambulatory Visit (HOSPITAL_BASED_OUTPATIENT_CLINIC_OR_DEPARTMENT_OTHER): Payer: Medicare PPO | Admitting: Physical Therapy

## 2022-06-01 DIAGNOSIS — M5459 Other low back pain: Secondary | ICD-10-CM | POA: Diagnosis not present

## 2022-06-01 DIAGNOSIS — R2689 Other abnormalities of gait and mobility: Secondary | ICD-10-CM | POA: Diagnosis not present

## 2022-06-01 DIAGNOSIS — M542 Cervicalgia: Secondary | ICD-10-CM | POA: Diagnosis not present
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
## 2022-06-07 DIAGNOSIS — R42 Dizziness and giddiness: Secondary | ICD-10-CM | POA: Diagnosis not present

## 2022-06-08 ENCOUNTER — Telehealth: Payer: Self-pay | Admitting: Internal Medicine

## 2022-06-08 DIAGNOSIS — E7801 Familial hypercholesterolemia: Secondary | ICD-10-CM

## 2022-06-08 NOTE — Telephone Encounter (Signed)
Order replaced

## 2022-06-08 NOTE — Telephone Encounter (Signed)
Patient has lab orders for lipid panel that expires on 06/15/22, she does not have a visit with Dr. Rennis Golden until June.  She wants to come in to get her labs done the week prior to her visit.  Can we please extend the order.  Thanks.
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
## 2022-06-14 ENCOUNTER — Ambulatory Visit: Payer: Medicare PPO

## 2022-06-14 ENCOUNTER — Telehealth: Payer: Self-pay | Admitting: Internal Medicine

## 2022-06-14 ENCOUNTER — Ambulatory Visit: Payer: Medicare PPO | Admitting: Sports Medicine

## 2022-06-14 VITALS — BP 126/80 | HR 78 | Ht 64.0 in | Wt 170.0 lb

## 2022-06-14 DIAGNOSIS — S060X0S Concussion without loss of consciousness, sequela: Secondary | ICD-10-CM

## 2022-06-14 DIAGNOSIS — S060X0D Concussion without loss of consciousness, subsequent encounter: Secondary | ICD-10-CM | POA: Diagnosis not present

## 2022-06-14 DIAGNOSIS — R270 Ataxia, unspecified: Secondary | ICD-10-CM

## 2022-06-14 DIAGNOSIS — R479 Unspecified speech disturbances: Secondary | ICD-10-CM

## 2022-06-14 NOTE — Progress Notes (Signed)
[facility] [place] Phone: [phone] Subjective:   Renee [name], am serving as a scribe for Dr. Hulan Saas.  I'm seeing this patient by the request  of:  Aura Dials, MD  CC: Low back pain follow-up  MWN:[id number]  Renee [name] is a 72 y.o. female coming in with complaint of back and neck pain. OMT 05/11/2022. Patient states that her back and neck have improved but not where she would like it to be. Low back is tight and hurts at times, states she doesn't remember a time where it hasn't been tight. Mostly describes it as sore. Been doing the elliptical at the gym and when she goes to have to get off she can really tell that she used that low back muscle. Patient noticed her ROM in her hips seems limited.   Medications patient has been prescribed: Gabapentin, Zanaflex  Taking: gabapentin Zanaflex          Reviewed prior external information including notes and imaging from previsou exam, outside providers and external EMR if available.   As well as notes that were available from care everywhere and other healthcare systems.  Past medical history, social, surgical and family history all reviewed in electronic medical record.  No pertanent information unless stated regarding to the chief complaint.   Past Medical History:  Diagnosis Date   Diabetes mellitus without complication (HCC)    HSV infection    Hyperlipidemia    Hypertension     Allergies  Allergen Reactions   Codeine Nausea Only     Review of Systems:  No  visual changes, nausea, vomiting, diarrhea, constipation, dizziness, abdominal pain, skin rash, fevers, chills, night sweats, weight loss, swollen lymph nodes, body aches, joint swelling, chest pain, shortness of breath, mood changes. POSITIVE muscle aches,  headache  Objective  Blood pressure 110/70, pulse 85, height 5\' 4"  (1.626 m), weight 170 lb (77.1 kg), SpO2 98 %.    General: No apparent distress alert and oriented x3 mood and affect normal, dressed appropriately.  HEENT: Pupils equal, extraocular movements intact  Respiratory: Patient's speak in full sentences and does not appear short of breath  Cardiovascular: No lower extremity edema, non tender, no erythema  Low back exam does have some loss of lordosis.  Some tenderness to palpation in the paraspinal musculature.  Patient still back does have some tightness noted and does seem to be out of proportion to the amount of palpation.  Limited range of motion in all planes  Osteopathic findings  T6 extended rotated and side bent left L2 flexed rotated and side bent right L5 flexed rotated and side bent left Sacrum right on right       Assessment and Plan:  Low back pain Still have some tightness from patient's baseline.  Seems to be normal still having either whiplash type syndromes or the possibility of reflux and pathetic dystrophy.  Discussed with patient about icing regimen and home exercises, which activities to do and which ones to avoid.  Patient wants to continue with formal physical therapy.  Discussed different medication such as the gabapentin and Zanaflex still.  Follow-up again in 6 to 8 weeks.    Nonallopathic problems  Decision today to treat with OMT was based on Physical Exam  After verbal consent patient was treated with HVLA, ME, FPR techniques in  thoracic, lumbar, and sacral  areas  Patient tolerated the procedure well with improvement in symptoms  Patient  given exercises, stretches and lifestyle modifications  See medications in patient instructions if given  Patient will follow up in 4-8 weeks    The above documentation has been reviewed and is accurate and complete Lyndal Pulley, DO          Note: This dictation was prepared with Dragon dictation along with smaller phrase technology. Any transcriptional errors that result from this process are  unintentional.

## 2022-06-14 NOTE — Telephone Encounter (Signed)
Received PA request for Nexletol in CMM -- Key: [id number] Message received: Authorization already on file for this request. Authorization starting on 06/12/2021 and ending on 06/12/2023.

## 2022-06-14 NOTE — Patient Instructions (Signed)
Good to see you You may continue speech and vestibular therapy as long as you are seeing improvement  May restart balance and agility class as tolerated See Dr. Tamala Julian next week  3-4 week follow up

## 2022-06-15 ENCOUNTER — Encounter (HOSPITAL_BASED_OUTPATIENT_CLINIC_OR_DEPARTMENT_OTHER): Payer: Medicare PPO | Admitting: Physical Therapy

## 2022-06-20 ENCOUNTER — Telehealth: Payer: Self-pay | Admitting: Internal Medicine

## 2022-06-20 ENCOUNTER — Encounter: Payer: Self-pay | Admitting: Interventional Cardiology

## 2022-06-20 NOTE — Telephone Encounter (Signed)
Error

## 2022-06-20 NOTE — Telephone Encounter (Signed)
Patient stated she fears taking Diovan-HCT because she received a class action letter that it is a carcinogen. She wants to know if there is another drug she can take.  2. Also, she believes amlodipine is causing her body to be "stiff" Stating that her sister and mother had that reaction and got better when taken off amlodipine.  Patient also wanted to know, now that her cardiologist retired in December, if Dr. Debara Pickett would be her cardiologist as well as lipid physician. Please advise on the above.

## 2022-06-20 NOTE — Telephone Encounter (Signed)
Spoke with patient and gave her Dr. Lysbeth Penner reply: "I would be happy to follow her for general cardiology as well as lipids. With regards to the valsartan/HCTZ - the drug valsartan is very safe, however, there have been manufacturing issues with chemicals used to process it that have occasionally contained impurities that may lead to cancer given significant exposure on the medicine. Some lots of the drug have been recalled at different times until this could be corrected. I feel that the drug itself is safe. With regards to the amlodipine question - I'm not sure if the stiffness is a side effect we see all that often. I'm concerned with stopping either or both medicines that your blood pressure will be poorly controlled and that there are not many other options for treatment."  Patient then stated for the past 10 days due to her fears, but now she feels more confident in taking the drug., she took Diovan every other day. He last BP was 119/70. Recommended that she take medication as prescribed and continue to keep a BP diary.  She has a video visit in June with Dr. Debara Pickett.

## 2022-06-20 NOTE — Telephone Encounter (Signed)
Responded to patient's MyChart message at 1:15pm today

## 2022-06-20 NOTE — Telephone Encounter (Signed)
Patient would like a call back from [facility] directly.    Pt c/o medication issue:  1. Name of Medication:   valsartan-hydrochlorothiazide (DIOVAN-HCT) 320-25 MG tablet  amLODipine (NORVASC) 5 MG tablet   2. How are you currently taking this medication (dosage and times per day)?   As prescribed  3. Are you having a reaction (difficulty breathing--STAT)?   No  4. What is your medication issue?   Patient stated she received information that the valsartan causes her to have "muscle cramps".  Patient would like advice on an alternate medication.

## 2022-06-21 ENCOUNTER — Ambulatory Visit: Payer: Medicare PPO | Admitting: Family Medicine

## 2022-06-21 VITALS — BP 110/70 | HR 85 | Ht 64.0 in | Wt 170.0 lb

## 2022-06-21 DIAGNOSIS — M545 Low back pain, unspecified: Secondary | ICD-10-CM

## 2022-06-21 DIAGNOSIS — M9904 Segmental and somatic dysfunction of sacral region: Secondary | ICD-10-CM | POA: Diagnosis not present

## 2022-06-21 DIAGNOSIS — G8929 Other chronic pain: Secondary | ICD-10-CM

## 2022-06-21 DIAGNOSIS — S134XXA Sprain of ligaments of cervical spine, initial encounter: Secondary | ICD-10-CM

## 2022-06-21 DIAGNOSIS — M9903 Segmental and somatic dysfunction of lumbar region: Secondary | ICD-10-CM

## 2022-06-21 DIAGNOSIS — M9902 Segmental and somatic dysfunction of thoracic region: Secondary | ICD-10-CM

## 2022-06-21 MED ORDER — AMBULATORY NON FORMULARY MEDICATION: 1.0000 [IU] | Freq: Once | 0 refills | Status: AC

## 2022-06-21 NOTE — Patient Instructions (Signed)
Good to see you  I think we are making progress Look into acupuncture  Hand rails in bathroom script written  Follow up in 6 weeks
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
## 2022-06-22 ENCOUNTER — Other Ambulatory Visit (HOSPITAL_COMMUNITY): Payer: Self-pay

## 2022-06-22 ENCOUNTER — Encounter (HOSPITAL_BASED_OUTPATIENT_CLINIC_OR_DEPARTMENT_OTHER): Payer: Medicare PPO | Admitting: Physical Therapy

## 2022-06-26 ENCOUNTER — Other Ambulatory Visit (HOSPITAL_BASED_OUTPATIENT_CLINIC_OR_DEPARTMENT_OTHER): Payer: Self-pay | Admitting: Internal Medicine

## 2022-06-26 DIAGNOSIS — R42 Dizziness and giddiness: Secondary | ICD-10-CM | POA: Diagnosis not present

## 2022-06-27 ENCOUNTER — Other Ambulatory Visit (HOSPITAL_BASED_OUTPATIENT_CLINIC_OR_DEPARTMENT_OTHER): Payer: Self-pay

## 2022-06-27 MED ORDER — AREXVY 120 MCG/0.5ML IM SUSR
INTRAMUSCULAR | 0 refills | Status: DC
  Filled 2022-06-27: qty 0.5, 1d supply, fill #0

## 2022-06-28 ENCOUNTER — Other Ambulatory Visit (HOSPITAL_BASED_OUTPATIENT_CLINIC_OR_DEPARTMENT_OTHER): Payer: Self-pay

## 2022-06-29 ENCOUNTER — Encounter (HOSPITAL_BASED_OUTPATIENT_CLINIC_OR_DEPARTMENT_OTHER): Payer: Medicare PPO | Admitting: Physical Therapy

## 2022-06-29 MED ORDER — VALSARTAN-HYDROCHLOROTHIAZIDE 320-25 MG PO TABS: 1.0000 | ORAL_TABLET | Freq: Every day | ORAL | 1 refills | Status: DC

## 2022-06-29 NOTE — Telephone Encounter (Signed)
Spoke with patient of Dr. Debara Pickett. Offered sooner appt with him - as she is transitioning her general cardiology care to him also. Scheduled for 08/09/22 @ 3;45pm and moved lab appt to 07/31/22

## 2022-06-30 ENCOUNTER — Ambulatory Visit: Payer: Medicare PPO | Attending: Sports Medicine

## 2022-06-30 DIAGNOSIS — R482 Apraxia: Secondary | ICD-10-CM

## 2022-06-30 DIAGNOSIS — R4701 Aphasia: Secondary | ICD-10-CM
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
## 2022-07-04 DIAGNOSIS — R42 Dizziness and giddiness: Secondary | ICD-10-CM | POA: Diagnosis not present

## 2022-07-06 ENCOUNTER — Encounter (HOSPITAL_BASED_OUTPATIENT_CLINIC_OR_DEPARTMENT_OTHER): Payer: Medicare PPO | Admitting: Physical Therapy

## 2022-07-07 DIAGNOSIS — R42 Dizziness and giddiness: Secondary | ICD-10-CM | POA: Diagnosis not present

## 2022-07-10 DIAGNOSIS — R42 Dizziness and giddiness: Secondary | ICD-10-CM | POA: Diagnosis not present
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
## 2022-07-12 ENCOUNTER — Ambulatory Visit: Payer: Medicare PPO

## 2022-07-12 ENCOUNTER — Ambulatory Visit: Payer: Medicare PPO | Admitting: Sports Medicine

## 2022-07-12 VITALS — BP 118/80 | HR 81 | Ht 64.0 in | Wt 170.0 lb

## 2022-07-12 DIAGNOSIS — R270 Ataxia, unspecified: Secondary | ICD-10-CM

## 2022-07-12 DIAGNOSIS — R42 Dizziness and giddiness: Secondary | ICD-10-CM | POA: Diagnosis not present

## 2022-07-12 DIAGNOSIS — R4701 Aphasia: Secondary | ICD-10-CM | POA: Diagnosis not present

## 2022-07-12 DIAGNOSIS — R482 Apraxia: Secondary | ICD-10-CM | POA: Diagnosis not present

## 2022-07-12 DIAGNOSIS — R479 Unspecified speech disturbances: Secondary | ICD-10-CM

## 2022-07-12 DIAGNOSIS — S060X0S Concussion without loss of consciousness, sequela: Secondary | ICD-10-CM

## 2022-07-12 NOTE — Patient Instructions (Addendum)
Good to see you  Fully cleared for concussion  As needed follow up
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
## 2022-07-17 ENCOUNTER — Telehealth: Payer: Self-pay

## 2022-07-17 DIAGNOSIS — R42 Dizziness and giddiness: Secondary | ICD-10-CM | POA: Diagnosis not present

## 2022-07-17 DIAGNOSIS — M545 Other chronic pain: Secondary | ICD-10-CM

## 2022-07-17 DIAGNOSIS — M9901 Segmental and somatic dysfunction of cervical region: Secondary | ICD-10-CM

## 2022-07-17 NOTE — Addendum Note (Signed)
Addended by: Pollyann Glen on: 07/17/2022 11:52 AM   Modules accepted: Orders

## 2022-07-17 NOTE — Telephone Encounter (Signed)
Patient is seeing [facility] PT and is done with concussion PT but would like to continue to see them for her back pain faxed to them.   Ordered pt and faxed it to [facility]

## 2022-07-19 DIAGNOSIS — R42 Dizziness and giddiness: Secondary | ICD-10-CM | POA: Diagnosis not present

## 2022-07-24 ENCOUNTER — Ambulatory Visit: Payer: Medicare PPO

## 2022-07-24 DIAGNOSIS — R42 Dizziness and giddiness: Secondary | ICD-10-CM | POA: Diagnosis not present

## 2022-07-27 NOTE — Progress Notes (Signed)
Renee [name] Phone: [phone] Subjective:   Renee [name], am serving as a scribe for Dr. Hulan Saas.  I'm seeing this patient by the request  of:  Aura Dials, MD  CC: Low back pain follow-up  [id number]  Renee [name] is a 72 y.o. female coming in with complaint of back and neck pain. OMT 06/21/2022. Patient states that her back is getting stronger. Has some soreness in the mornings.  Patient feels like she continues to make progress.  Doing more lifting.  Patient's goal is to be able to travel this summer.  Medications patient has been prescribed: Zanaflex  Taking:         Reviewed prior external information including notes and imaging from previsou exam, outside providers and external EMR if available.   As well as notes that were available from care everywhere and other healthcare systems.  Past medical history, social, surgical and family history all reviewed in electronic medical record.  [name] pertanent information unless stated regarding to the chief complaint.   Past Medical History:  Diagnosis Date   Diabetes mellitus without complication (HCC)    HSV infection    Hyperlipidemia    Hypertension     Allergies  Allergen Reactions   Codeine Nausea Only     Review of Systems:  [name] headache, visual changes, nausea, vomiting, diarrhea, constipation, dizziness, abdominal pain, skin rash, fevers, chills, night sweats, weight loss, swollen lymph nodes, body aches, joint swelling, chest pain, shortness of breath, mood changes. POSITIVE muscle aches  Objective  Blood pressure 112/74, pulse 84, height 5' 4"$  (1.626 m), weight 170 lb (77.1 kg).   General: [name] apparent distress alert and oriented x3 mood and affect normal, dressed appropriately.  HEENT: Pupils equal, extraocular movements intact  Respiratory: Patient's speak in full sentences and does not appear short of breath   Cardiovascular: [name] lower extremity edema, non tender, [name] erythema  Antalgic gait noted Low back does have loss of lordosis  Tightness of FABER    Osteopathic findings  C5 flexed rotated and side bent left T4 extended rotated and side bent right inhaled rib T8 extended rotated and side bent left L2 flexed rotated and side bent right  L5 flexed rotated and side bent left Sacrum right on right       Assessment and Plan:  Low back pain Low back does have loss of lordosis  Discussed HEP  Positive FABER  More muscle soreness Discussed with patient though because of the muscle soreness we could get laboratory workup which patient would like to check to make sure nothing else is potentially contributing to the pain.  Patient will have that done at a later date.  We discussed icing regimen and home exercises otherwise.  Discussed vitamin D supplementation.  Follow-up again in 6 to 8 weeks    Nonallopathic problems  Decision today to treat with OMT was based on Physical Exam  After verbal consent patient was treated with HVLA, ME, FPR techniques in cervical, rib, thoracic, lumbar, and sacral  areas  Patient tolerated the procedure well with improvement in symptoms  Patient given exercises, stretches and lifestyle modifications  See medications in patient instructions if given  Patient will follow up in 4-8 weeks    The above documentation has been reviewed and is accurate and complete Lyndal Pulley, DO          Note: This dictation was prepared with Dragon dictation  along with smaller phrase technology. Any transcriptional errors that result from this process are unintentional.

## 2022-07-28 DIAGNOSIS — R42 Dizziness and giddiness: Secondary | ICD-10-CM | POA: Diagnosis not present

## 2022-07-31 ENCOUNTER — Ambulatory Visit: Payer: Medicare PPO | Attending: Family Medicine

## 2022-07-31 DIAGNOSIS — R42 Dizziness and giddiness: Secondary | ICD-10-CM | POA: Diagnosis not present

## 2022-07-31 DIAGNOSIS — E7801 Familial hypercholesterolemia: Secondary | ICD-10-CM | POA: Insufficient documentation

## 2022-08-01 LAB — LIPID PANEL
Chol/HDL Ratio: 3 ratio (ref 0.0–4.4)
Cholesterol, Total: 179 mg/dL (ref 100–199)
HDL: 60 mg/dL (ref >39)
LDL Chol Calc (NIH): 110 mg/dL — ABNORMAL HIGH (ref 0–99)
Triglycerides: 42 mg/dL (ref 0–149)
VLDL Cholesterol Cal: 9 mg/dL (ref 5–40)

## 2022-08-02 ENCOUNTER — Ambulatory Visit: Payer: Medicare PPO | Attending: Sports Medicine

## 2022-08-02 ENCOUNTER — Ambulatory Visit: Payer: Medicare PPO | Admitting: Family Medicine

## 2022-08-02 ENCOUNTER — Encounter: Payer: Self-pay | Admitting: Family Medicine

## 2022-08-02 VITALS — BP 112/74 | HR 84 | Ht 64.0 in | Wt 170.0 lb

## 2022-08-02 DIAGNOSIS — M9902 Segmental and somatic dysfunction of thoracic region: Secondary | ICD-10-CM

## 2022-08-02 DIAGNOSIS — M9908 Segmental and somatic dysfunction of rib cage: Secondary | ICD-10-CM

## 2022-08-02 DIAGNOSIS — M9904 Segmental and somatic dysfunction of sacral region: Secondary | ICD-10-CM

## 2022-08-02 DIAGNOSIS — M545 Low back pain, unspecified: Secondary | ICD-10-CM

## 2022-08-02 DIAGNOSIS — M9903 Segmental and somatic dysfunction of lumbar region: Secondary | ICD-10-CM | POA: Diagnosis not present

## 2022-08-02 DIAGNOSIS — R42 Dizziness and giddiness: Secondary | ICD-10-CM | POA: Diagnosis not present

## 2022-08-02 DIAGNOSIS — M9901 Segmental and somatic dysfunction of cervical region: Secondary | ICD-10-CM

## 2022-08-02 DIAGNOSIS — G8929 Other chronic pain: Secondary | ICD-10-CM | POA: Diagnosis not present

## 2022-08-02 DIAGNOSIS — M2550 Pain in unspecified joint: Secondary | ICD-10-CM

## 2022-08-02 DIAGNOSIS — R4701 Aphasia: Secondary | ICD-10-CM | POA: Insufficient documentation

## 2022-08-02 NOTE — Patient Instructions (Signed)
Good to see you Labs-get when you want See me again in 6-8 weeks

## 2022-08-02 NOTE — Assessment & Plan Note (Signed)
Low back does have loss of lordosis  Discussed HEP  Positive FABER  More muscle soreness Discussed with patient though because of the muscle soreness we could get laboratory workup which patient would like to check to make sure nothing else is potentially contributing to the pain.  Patient will have that done at a later date.  We discussed icing regimen and home exercises otherwise.  Discussed vitamin D supplementation.  Follow-up again in 6 to 8 weeks
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
## 2022-08-09 ENCOUNTER — Other Ambulatory Visit: Payer: Self-pay | Admitting: Internal Medicine

## 2022-08-09 ENCOUNTER — Ambulatory Visit: Payer: Medicare PPO | Attending: Internal Medicine | Admitting: Internal Medicine

## 2022-08-09 ENCOUNTER — Encounter: Payer: Self-pay | Admitting: Internal Medicine

## 2022-08-09 VITALS — BP 110/74 | HR 71 | Ht 64.0 in | Wt 171.0 lb

## 2022-08-09 DIAGNOSIS — I2510 Atherosclerotic heart disease of native coronary artery without angina pectoris: Secondary | ICD-10-CM

## 2022-08-09 DIAGNOSIS — I10 Essential (primary) hypertension: Secondary | ICD-10-CM | POA: Diagnosis not present

## 2022-08-09 DIAGNOSIS — E7801 Familial hypercholesterolemia: Secondary | ICD-10-CM | POA: Diagnosis not present

## 2022-08-09 DIAGNOSIS — M791 Myalgia, unspecified site: Secondary | ICD-10-CM

## 2022-08-09 DIAGNOSIS — T466X5A Adverse effect of antihyperlipidemic and antiarteriosclerotic drugs, initial encounter: Secondary | ICD-10-CM

## 2022-08-09 DIAGNOSIS — E7849 Other hyperlipidemia: Secondary | ICD-10-CM

## 2022-08-09 NOTE — Progress Notes (Signed)
LIPID CLINIC CONSULT NOTE  Chief Complaint:  Follow-up dyslipidemia, establish cardiologist  Primary Care Physician: Aura Dials, MD  Primary Cardiologist:  Pixie Casino, MD  HPI:  Renee [name] is a 72 y.o. female who is being seen today for the evaluation of dyslipidemia at the request of Aura Dials, MD.  This is a pleasant 72 year old female patient of Dr. Tamala Julian with a history of hypertension, longstanding dyslipidemia which has been difficult to treat as well as a family history of dyslipidemia and heart disease in her mother.  She underwent CT coronary angiography in May 2019 which showed two-vessel coronary disease with mild disease in the proximal LAD and circumflex arteries as well as a calcium score 107, 80th percentile for age and sex matched control.  Unfortunately she has been intolerant of statins causing significant myalgias.  She was seen and treated by my pharmacist team in the lipid clinic who placed her on bempedoic acid.  She reports compliance with this and was also placed on Praluent 75 mg every 2 weeks.  Despite this, her lipids in April 2020 showed total cholesterol 264, triglycerides 87, HDL 66 and LDL 181.  2 years prior to that her LDL had been 116.  More recently her PCP drew lipids which showed total cholesterol 222, HDL 68, LDL 140 and triglycerides 81.  Hemoglobin A1c was 6.4.  Her target LDL is less than 70.  05/19/2020  Renee [name] is seen today in follow-up.  She has had significant additional reduction in her cholesterol.  Labs were drawn a couple days ago and we receive them today.  Her total cholesterol is now 158, triglycerides 48, HDL 63 and an LDL of 85 (down from 140).  Overall she seems to be tolerating this combination of medications very well.  She is not quite at target LDL less than 70, I do think we will achieve that soon.  At this time she does not want to add or change any medications.  11/10/2020  Renee [name] returns today for  follow-up.  She has been more physically active, having joint [facility].  Despite this, her cholesterol numbers have actually slightly worsened.  Total cholesterol is now 183 up from 158, triglycerides are 140, HDL 71 and LDL of 104 up from 85.  She continues on the Praluent and Nexletol.  Overall she is tolerating the medications well.  She reports compliance with them.  Her A1c is also slightly higher at 6.3, previously at 6.1.  This does suggest a dietary component.  She admits that she may have relaxed her diet a little bit more since she had such significant reduction in her cholesterol with the Praluent.  06/15/2021  Renee [name] returns today for follow-up.  She has been working hard exercising here at the [employer] and fitness.  She is also made dietary changes.  Her cholesterol is accordingly improved with total now 139, triglycerides 62, HDL 41 and LDL 67  08/09/2022  Renee [name] is seen today in follow-up of her lipids.  In the interim her cardiologist Dr. Tamala Julian is retired and she will be transitioning care to me for general cardiology.  She did have repeat lipids which showed a trend toward an increase in her cholesterol.  Her LDL is now 110 which is up from 97 last summer and 67 back in December 2022.  Total cholesterol 179, triglycerides 42 and HDL 60.  She reports compliance with both her Repatha and Nexletol.  She says there has been some discrepancy in the every 2-week dosing due to issues with getting her medication refills.  Part of that is because of the monthly refills.  She also has noted recently some fatigue, inability to lose weight on Ozempic and has recently had some hot and cold intolerance and weight gain.  I wondered if this might be related to her thyroid.  She also has a history of vitamin D deficiency in the past on 50,000 units but no longer takes vitamin D..  PMHx:  Past Medical History:  Diagnosis Date   Diabetes mellitus without  complication ([place])    HSV infection    Hyperlipidemia    Hypertension     Past Surgical History:  Procedure Laterality Date   TOE SURGERY  1996   TUBAL LIGATION     WISDOM TOOTH EXTRACTION      FAMHx:  Family History  Problem Relation Age of Onset   Hyperlipidemia Mother    Hypertension Mother    Lung cancer Father    Cancer Father    Breast cancer Sister    Cancer Sister    Breast cancer Maternal Aunt    Cancer Maternal Aunt    Hyperlipidemia Maternal Grandmother    Hypertension Maternal Grandmother    Colon cancer Paternal Uncle    Cancer Paternal Uncle    Colon cancer Paternal Uncle    Diabetes Daughter     SOCHx:   reports that she has never smoked. She has never used smokeless tobacco. She reports current alcohol use. She reports that she does not use drugs.  ALLERGIES:  Allergies  Allergen Reactions   Codeine Nausea Only    ROS: Pertinent items noted in HPI and remainder of comprehensive ROS otherwise negative.  HOME MEDS: Current Outpatient Medications on File Prior to Visit  Medication Sig Dispense Refill   amLODipine (NORVASC) 5 MG tablet TAKE 1 TABLET BY MOUTH EVERY DAY 90 tablet 1   aspirin 81 MG tablet Take 81 mg by mouth daily.     Blood Glucose Monitoring Suppl (ACCU-CHEK GUIDE) w/Device KIT as directed.     glucose blood (ACCU-CHEK GUIDE) test strip as directed.     LUMIGAN 0.01 % SOLN Place 1 drop into both eyes every evening.     NEXLETOL 180 MG TABS TAKE 1 TABLET BY MOUTH EVERY DAY 30 tablet 11   RSV vaccine recomb adjuvanted (AREXVY) 120 MCG/0.5ML injection Inject into the muscle. 0.5 mL 0   Semaglutide (OZEMPIC, 1 MG/DOSE, [place]) Inject 1 mg into the skin once a week.     valACYclovir (VALTREX) 500 MG tablet Take 200 mg by mouth as needed.     valsartan-hydrochlorothiazide (DIOVAN-HCT) 320-25 MG tablet Take 1 tablet by mouth daily. 90 tablet 1   XIIDRA 5 % SOLN Apply 1 drop to eye 2 (two) times daily.     No current facility-administered  medications on file prior to visit.    LABS/IMAGING: No results found for this or any previous visit (from the past 48 hour(s)). No results found.  LIPID PANEL:    Component Value Date/Time   CHOL 179 07/31/2022 0900   CHOL 346 (H) 06/17/2015 1431   TRIG 42 07/31/2022 0900   TRIG 89 06/17/2015 1431   HDL 60 07/31/2022 0900   HDL 71 06/17/2015 1431   CHOLHDL 3.0 07/31/2022 0900   CHOLHDL 4.9 06/17/2015 1431   LDLCALC 110 (H) 07/31/2022 0900   LDLCALC 257 (H) 06/17/2015 1431  WEIGHTS: Wt Readings from Last 3 Encounters:  08/09/22 171 lb (77.6 kg)  08/02/22 170 lb (77.1 kg)  07/12/22 170 lb (77.1 kg)    VITALS: BP 110/74 (BP Location: Left Arm, Patient Position: Sitting, Cuff Size: Normal)   Pulse 71   Ht '5\' 4"'$  (1.626 m)   Wt 171 lb (77.6 kg)   SpO2 95%   BMI 29.35 kg/m   EXAM: General appearance: alert Neck: no carotid bruit, no JVD, and thyroid not enlarged, symmetric, no tenderness/mass/nodules Lungs: clear to auscultation bilaterally Heart: regular rate and rhythm, S1, S2 normal, no murmur, click, rub or gallop Abdomen: soft, non-tender; bowel sounds normal; no masses,  no organomegaly Extremities: extremities normal, atraumatic, no cyanosis or edema Pulses: 2+ and symmetric Skin: Skin color, texture, turgor normal. No rashes or lesions Neurologic: Grossly normal  EKG: Normal sinus rhythm at 71-personally reviewed  ASSESSMENT: Probable familial hyperlipidemia Two-vessel coronary disease with calcium score of 107, 80th percentile for age and sex matched control Family history of dyslipidemia and heart disease Statin intolerance-myalgias  PLAN: 1.   Renee [name] returns today for follow-up.  She is transitioning care from Dr. Tamala Julian to myself.  Her cholesterol has been creeping up somewhat despite compliance with Repatha and Nexletol.  Her LDL is now up to 110.  She has been on Ozempic but has not lost weight.  She is felt some hot and cold intolerance and  fatigue and I wonder if she might be hypothyroid.  This has not been assessed in some time.  I would like to check a TSH and free T4 today.  Also it was noted that she had previously been on high-dose vitamin D but has not been on it recently.  I suspect she may have deficiency and we will assess that as well.  Plan repeat lipids in about 3 to 4 months and if any of her lab work is abnormal of course will be addressing that.  Pixie Casino, MD, [facility], [place] Director of the Advanced Lipid Disorders &  Cardiovascular Risk Reduction Clinic Diplomate of the American Board of Clinical Lipidology Attending Cardiologist  Direct Dial: [phone]  Fax: [phone]  Website:  [email or web address] 08/09/2022, 4:17 PM

## 2022-08-09 NOTE — Patient Instructions (Signed)
Medication Instructions:  NO CHANGES  *If you need a refill on your cardiac medications before your next appointment, please call your pharmacy*   Lab Work: TSH, Free T4, Vit D  Fasting labs in 6 months -- before next appointment  If you have labs (blood work) drawn today and your tests are completely normal, you will receive your results only by: [place] (if you have MyChart) OR A paper copy in the mail If you have any lab test that is abnormal or we need to change your treatment, we will call you to review the results.   Follow-Up: At [facility], you and your health needs are our priority.  As part of our continuing mission to provide you with exceptional heart care, we have created designated Provider Care Teams.  These Care Teams include your primary Cardiologist (physician) and Advanced Practice Providers (APPs -  Physician Assistants and Nurse Practitioners) who all work together to provide you with the care you need, when you need it.  We recommend signing up for the patient portal called "MyChart".  Sign up information is provided on this After Visit Summary.  MyChart is used to connect with patients for Virtual Visits (Telemedicine).  Patients are able to view lab/test results, encounter notes, upcoming appointments, etc.  Non-urgent messages can be sent to your provider as well.   To learn more about what you can do with MyChart, go to [email or web address].    Your next appointment:   6 months  Provider:   Lyman Bishop MD

## 2022-08-10 LAB — TSH+FREE T4
Free T4: 1.17 ng/dL (ref 0.82–1.77)
TSH: 1.11 u[IU]/mL (ref 0.450–4.500)

## 2022-08-10 LAB — VITAMIN D 25 HYDROXY (VIT D DEFICIENCY, FRACTURES): Vit D, 25-Hydroxy: 28.9 ng/mL — ABNORMAL LOW (ref 30.0–100.0)

## 2022-08-11 ENCOUNTER — Other Ambulatory Visit: Payer: Self-pay

## 2022-08-11 MED ORDER — AMLODIPINE BESYLATE 5 MG PO TABS: 5.0000 mg | ORAL_TABLET | Freq: Every day | ORAL | 3 refills | Status: DC

## 2022-08-17 ENCOUNTER — Other Ambulatory Visit: Payer: Self-pay | Admitting: *Deleted

## 2022-08-22 ENCOUNTER — Other Ambulatory Visit (INDEPENDENT_AMBULATORY_CARE_PROVIDER_SITE_OTHER): Payer: Medicare PPO

## 2022-08-22 DIAGNOSIS — M2550 Pain in unspecified joint: Secondary | ICD-10-CM

## 2022-08-22 LAB — COMPREHENSIVE METABOLIC PANEL
ALT: 12 U/L (ref 0–35)
AST: 14 U/L (ref 0–37)
Albumin: 4.2 g/dL (ref 3.5–5.2)
Alkaline Phosphatase: 46 U/L (ref 39–117)
BUN: 20 mg/dL (ref 6–23)
CO2: 28 mEq/L (ref 19–32)
Calcium: 10 mg/dL (ref 8.4–10.5)
Chloride: 103 mEq/L (ref 96–112)
Creatinine, Ser: 0.87 mg/dL (ref 0.40–1.20)
GFR: 66.92 mL/min (ref >60.00)
Glucose, Bld: 143 mg/dL — ABNORMAL HIGH (ref 70–99)
Potassium: 3.7 mEq/L (ref 3.5–5.1)
Sodium: 142 mEq/L (ref 135–145)
Total Bilirubin: 0.5 mg/dL (ref 0.2–1.2)
Total Protein: 7.3 g/dL (ref 6.0–8.3)

## 2022-08-22 LAB — CBC WITH DIFFERENTIAL/PLATELET
Basophils Absolute: 0 10*3/uL (ref 0.0–0.1)
Basophils Relative: 0.6 % (ref 0.0–3.0)
Eosinophils Absolute: 0.1 10*3/uL (ref 0.0–0.7)
Eosinophils Relative: 2.6 % (ref 0.0–5.0)
HCT: 40 % (ref 36.0–46.0)
Hemoglobin: 13.2 g/dL (ref 12.0–15.0)
Lymphocytes Relative: 39.2 % (ref 12.0–46.0)
Lymphs Abs: 1.7 10*3/uL (ref 0.7–4.0)
MCHC: 33 g/dL (ref 30.0–36.0)
MCV: 91.9 fl (ref 78.0–100.0)
Monocytes Absolute: 0.4 10*3/uL (ref 0.1–1.0)
Monocytes Relative: 8.5 % (ref 3.0–12.0)
Neutro Abs: 2.2 10*3/uL (ref 1.4–7.7)
Neutrophils Relative %: 49.1 % (ref 43.0–77.0)
Platelets: 244 10*3/uL (ref 150.0–400.0)
RBC: 4.35 Mil/uL (ref 3.87–5.11)
RDW: 13.1 % (ref 11.5–15.5)
WBC: 4.5 10*3/uL (ref 4.0–10.5)

## 2022-08-22 LAB — IBC PANEL
Iron: 110 ug/dL (ref 42–145)
Saturation Ratios: 28.1 % (ref 20.0–50.0)
TIBC: 392 ug/dL (ref 250.0–450.0)
Transferrin: 280 mg/dL (ref 212.0–360.0)

## 2022-08-22 LAB — VITAMIN B12: Vitamin B-12: 306 pg/mL (ref 211–911)

## 2022-08-22 LAB — URIC ACID: Uric Acid, Serum: 6 mg/dL (ref 2.4–7.0)

## 2022-08-22 LAB — SEDIMENTATION RATE: Sed Rate: 11 mm/hr (ref 0–30)

## 2022-08-22 LAB — C-REACTIVE PROTEIN: CRP: <1 mg/dL (ref 0.5–20.0)

## 2022-08-22 LAB — FERRITIN: Ferritin: 70 ng/mL (ref 10.0–291.0)

## 2022-08-24 DIAGNOSIS — H43813 Vitreous degeneration, bilateral: Secondary | ICD-10-CM | POA: Diagnosis not present

## 2022-08-24 DIAGNOSIS — H16223 Keratoconjunctivitis sicca, not specified as Sjogren's, bilateral: Secondary | ICD-10-CM | POA: Diagnosis not present

## 2022-08-24 DIAGNOSIS — H40053 Ocular hypertension, bilateral: Secondary | ICD-10-CM | POA: Diagnosis not present

## 2022-08-24 DIAGNOSIS — H2513 Age-related nuclear cataract, bilateral: Secondary | ICD-10-CM | POA: Diagnosis not present

## 2022-08-24 LAB — CALCIUM, IONIZED: Calcium, Ion: 5 mg/dL (ref 4.7–5.5)

## 2022-08-24 LAB — ANTI-NUCLEAR AB-TITER (ANA TITER): ANA Titer 1: 1:40 {titer} — ABNORMAL HIGH

## 2022-08-24 LAB — PTH, INTACT AND CALCIUM
Calcium: 10.3 mg/dL (ref 8.6–10.4)
PTH: 40 pg/mL (ref 16–77)

## 2022-08-24 LAB — CYCLIC CITRUL PEPTIDE ANTIBODY, IGG: Cyclic Citrullin Peptide Ab: <16 UNITS

## 2022-08-24 LAB — ANA: Anti Nuclear Antibody (ANA): POSITIVE — AB

## 2022-08-24 LAB — ANGIOTENSIN CONVERTING ENZYME: Angiotensin-Converting Enzyme: 35 U/L (ref 9–67)

## 2022-08-24 LAB — RHEUMATOID FACTOR: Rheumatoid fact SerPl-aCnc: <14 IU/mL (ref <14)

## 2022-09-18 ENCOUNTER — Ambulatory Visit: Payer: Medicare PPO | Admitting: Family Medicine

## 2022-09-18 ENCOUNTER — Encounter: Payer: Self-pay | Admitting: Family Medicine

## 2022-09-18 VITALS — BP 112/78 | HR 92 | Ht 64.0 in | Wt 169.0 lb

## 2022-09-18 DIAGNOSIS — M9904 Segmental and somatic dysfunction of sacral region: Secondary | ICD-10-CM

## 2022-09-18 DIAGNOSIS — G8929 Other chronic pain: Secondary | ICD-10-CM

## 2022-09-18 DIAGNOSIS — M9902 Segmental and somatic dysfunction of thoracic region: Secondary | ICD-10-CM | POA: Diagnosis not present

## 2022-09-18 DIAGNOSIS — M9903 Segmental and somatic dysfunction of lumbar region: Secondary | ICD-10-CM | POA: Diagnosis not present

## 2022-09-18 DIAGNOSIS — M9901 Segmental and somatic dysfunction of cervical region: Secondary | ICD-10-CM | POA: Diagnosis not present

## 2022-09-18 DIAGNOSIS — M545 Low back pain, unspecified: Secondary | ICD-10-CM

## 2022-09-18 DIAGNOSIS — M9908 Segmental and somatic dysfunction of rib cage: Secondary | ICD-10-CM | POA: Diagnosis not present

## 2022-09-18 NOTE — Assessment & Plan Note (Signed)
Chronic problem with management of this disease.  No significant radicular symptoms.  Has responded well with working on exam.  Patient is still going to be working on weight loss. .  Follow-up again in 6 to 8 weeks

## 2022-09-18 NOTE — Patient Instructions (Signed)
[place] [employer] Support orthotics Don't lace last eye See you again in 2 month

## 2022-09-18 NOTE — Progress Notes (Signed)
[name] [address] [place] [address] Phone: [phone] Subjective:    I'm seeing this patient by the request  of:  Renee Screws, MD  CC: Back and neck pain follow-up  [id number]  Renee [name] is a 72 y.o. female coming in with complaint of back and neck pain. OMT 08/02/2022. Patient states that her back is doing better. Her neck is stiff today.   C/o burning L foot in pinky and great toe. Feels like she is standing on the outside of her feet. Also having pain in ankle mortise.   Wants to know how to protect her back for her trip to [place] in July.   Medications patient has been prescribed:   Taking:         Reviewed prior external information including notes and imaging from previsou exam, outside providers and external EMR if available.   As well as notes that were available from care everywhere and other healthcare systems.  Past medical history, social, surgical and family history all reviewed in electronic medical record.  No pertanent information unless stated regarding to the chief complaint.   Past Medical History:  Diagnosis Date   Diabetes mellitus without complication (HCC)    HSV infection    Hyperlipidemia    Hypertension     Allergies  Allergen Reactions   Codeine Nausea Only     Review of Systems:  No headache, visual changes, nausea, vomiting, diarrhea, constipation, dizziness, abdominal pain, skin rash, fevers, chills, night sweats, weight loss, swollen lymph nodes, body aches, joint swelling, chest pain, shortness of breath, mood changes. POSITIVE muscle aches  Objective  Blood pressure 112/78, pulse 92, height 5\' 4"  (1.626 m), weight 169 lb (76.7 kg), SpO2 95 %.   General: No apparent distress alert and oriented x3 mood and affect normal, dressed appropriately.  HEENT: Pupils equal, extraocular movements intact  Respiratory: Patient's speak in full sentences and does not appear short of  breath  Cardiovascular: No lower extremity edema, non tender, no erythema  Low back does have some loss of lordosis noted.  Some tenderness to palpation in the paraspinal musculature.  Patient does have some limited sidebending of the neck bilaterally still.  Patient does have  Osteopathic findings  C2 flexed rotated and side bent right C6 flexed rotated and side bent left T3 extended rotated and side bent right inhaled rib T9 extended rotated and side bent left L2 flexed rotated and side bent right Sacrum right on right       Assessment and Plan:  Low back pain Chronic problem with management of this disease.  No significant radicular symptoms.  Has responded well with working on exam.  Patient is still going to be working on weight loss. .  Follow-up again in 6 to 8 weeks    Nonallopathic problems  Decision today to treat with OMT was based on Physical Exam  After verbal consent patient was treated with HVLA, ME, FPR techniques in cervical, rib, thoracic, lumbar, and sacral  areas  Patient tolerated the procedure well with improvement in symptoms  Patient given exercises, stretches and lifestyle modifications  See medications in patient instructions if given  Patient will follow up in 4-8 weeks     The above documentation has been reviewed and is accurate and complete Renee Saa, DO         Note: This dictation was prepared with Dragon dictation along with smaller phrase technology. Any transcriptional errors that result  from this process are unintentional.

## 2022-09-20 ENCOUNTER — Ambulatory Visit: Payer: Medicare PPO | Admitting: Family Medicine

## 2022-11-10 ENCOUNTER — Other Ambulatory Visit: Payer: Medicare PPO

## 2022-11-13 DIAGNOSIS — Z1239 Encounter for other screening for malignant neoplasm of breast: Secondary | ICD-10-CM | POA: Diagnosis not present

## 2022-11-13 DIAGNOSIS — Z124 Encounter for screening for malignant neoplasm of cervix: Secondary | ICD-10-CM | POA: Diagnosis not present

## 2022-11-13 DIAGNOSIS — M8580 Other specified disorders of bone density and structure, unspecified site: Secondary | ICD-10-CM | POA: Diagnosis not present

## 2022-11-13 DIAGNOSIS — Z1211 Encounter for screening for malignant neoplasm of colon: Secondary | ICD-10-CM | POA: Diagnosis not present

## 2022-11-13 DIAGNOSIS — Z01419 Encounter for gynecological examination (general) (routine) without abnormal findings: Secondary | ICD-10-CM | POA: Diagnosis not present

## 2022-11-13 DIAGNOSIS — Z6828 Body mass index (BMI) 28.0-28.9, adult: Secondary | ICD-10-CM | POA: Diagnosis not present

## 2022-11-15 ENCOUNTER — Telehealth: Payer: Medicare PPO | Admitting: Internal Medicine

## 2022-11-15 NOTE — Progress Notes (Unsigned)
[name] [address] [place] [address] Phone: [phone] Subjective:   Renee [name], am serving as a scribe for Dr. Antoine Primas.  I'm seeing this patient by the request  of:  [name], MD  CC: Back and neck pain follow-up  [id number]  Renee [name] is a 72 y.o. female coming in with complaint of back and neck pain. OMT 09/18/2022. Patient states that she has less pain with movement.   Both feet are painful. Pain moves throughout the entire foot. Using Voltaren gel.   Medications patient has been prescribed: None  Taking:         Reviewed prior external information including notes and imaging from previsou exam, outside providers and external EMR if available.   As well as notes that were available from care everywhere and other healthcare systems.  Past medical history, social, surgical and family history all reviewed in electronic medical record.  No pertanent information unless stated regarding to the chief complaint.   Past Medical History:  Diagnosis Date   Diabetes mellitus without complication (HCC)    HSV infection    Hyperlipidemia    Hypertension     Allergies  Allergen Reactions   Codeine Nausea Only     Review of Systems:  No headache, visual changes, nausea, vomiting, diarrhea, constipation, dizziness, abdominal pain, skin rash, fevers, chills, night sweats, weight loss, swollen lymph nodes, body aches, joint swelling, chest pain, shortness of breath, mood changes. POSITIVE muscle aches  Objective  Blood pressure 108/68, pulse 85, height 5\' 4"  (1.626 m), weight 166 lb (75.3 kg), SpO2 98 %.   General: No apparent distress alert and oriented x3 mood and affect normal, dressed appropriately.  HEENT: Pupils equal, extraocular movements intact  Respiratory: Patient's speak in full sentences and does not appear short of breath  Cardiovascular: No lower extremity edema, non tender, no erythema   Low back does have loss of lordosis noted.  Some tenderness to palpation in the paraspinal musculature still.  Some limited sidebending of the neck.  Tightness in flexion and extension noted.  Foot exam does show the patient does have some rigid midfoot noted.  Narrow heel noted.  Mild antalgic gait  Osteopathic findings  C3 flexed rotated and side bent right C6 flexed rotated and side bent left T5 extended rotated and side bent right inhaled rib T9 extended rotated and side bent left L2 flexed rotated and side bent right Sacrum right on right       Assessment and Plan:  Arthritis of midfoot Continues to have a rigid midfoot.  Discussed with patient about icing regimen.  Has a bilaterally.  Failed over-the-counter orthotics and do think custom orthotics will be beneficial for this in the antalgic gait.  Patient does have a large trip to [place] and will be doing a lot of walking.  Discussed hiking boots in greater detail.  Follow-up again in 6 to 8 weeks otherwise.  Low back pain Does have some loss lordosis noted.  Some tenderness to palpation needed.  Increase activity slowly.  Follow-up again in 6 to 8 weeks discussed medications and sent in prednisone to help with her trip.    Nonallopathic problems  Decision today to treat with OMT was based on Physical Exam  After verbal consent patient was treated with HVLA, ME, FPR techniques in cervical, rib, thoracic, lumbar, and sacral  areas  Patient tolerated the procedure well with improvement in symptoms  Patient given exercises, stretches  and lifestyle modifications  See medications in patient instructions if given  Patient will follow up in 4-8 weeks     The above documentation has been reviewed and is accurate and complete Judi Saa, DO         Note: This dictation was prepared with Dragon dictation along with smaller phrase technology. Any transcriptional errors that result from this process are unintentional.

## 2022-11-21 ENCOUNTER — Other Ambulatory Visit: Payer: Self-pay

## 2022-11-21 ENCOUNTER — Encounter: Payer: Self-pay | Admitting: Family Medicine

## 2022-11-21 ENCOUNTER — Ambulatory Visit: Payer: Medicare PPO | Admitting: Family Medicine

## 2022-11-21 VITALS — BP 108/68 | HR 85 | Ht 64.0 in | Wt 166.0 lb

## 2022-11-21 DIAGNOSIS — M19071 Primary osteoarthritis, right ankle and foot: Secondary | ICD-10-CM | POA: Diagnosis not present

## 2022-11-21 DIAGNOSIS — M9902 Segmental and somatic dysfunction of thoracic region: Secondary | ICD-10-CM

## 2022-11-21 DIAGNOSIS — M9901 Segmental and somatic dysfunction of cervical region: Secondary | ICD-10-CM

## 2022-11-21 DIAGNOSIS — M9904 Segmental and somatic dysfunction of sacral region: Secondary | ICD-10-CM

## 2022-11-21 DIAGNOSIS — M9908 Segmental and somatic dysfunction of rib cage: Secondary | ICD-10-CM

## 2022-11-21 DIAGNOSIS — M19072 Primary osteoarthritis, left ankle and foot: Secondary | ICD-10-CM | POA: Diagnosis not present

## 2022-11-21 DIAGNOSIS — M9903 Segmental and somatic dysfunction of lumbar region: Secondary | ICD-10-CM | POA: Diagnosis not present

## 2022-11-21 DIAGNOSIS — M545 Low back pain, unspecified: Secondary | ICD-10-CM | POA: Diagnosis not present

## 2022-11-21 DIAGNOSIS — M216X9 Other acquired deformities of unspecified foot: Secondary | ICD-10-CM

## 2022-11-21 DIAGNOSIS — G8929 Other chronic pain: Secondary | ICD-10-CM

## 2022-11-21 MED ORDER — PREDNISONE 20 MG PO TABS: 20.0000 mg | ORAL_TABLET | Freq: Every day | ORAL | 0 refills | Status: DC

## 2022-11-21 MED ORDER — HYDROXYZINE HCL 10 MG PO TABS: 10.0000 mg | ORAL_TABLET | Freq: Three times a day (TID) | ORAL | 0 refills | Status: DC | PRN

## 2022-11-21 NOTE — Assessment & Plan Note (Signed)
Does have some loss lordosis noted.  Some tenderness to palpation needed.  Increase activity slowly.  Follow-up again in 6 to 8 weeks discussed medications and sent in prednisone to help with her trip.

## 2022-11-21 NOTE — Assessment & Plan Note (Signed)
Continues to have a rigid midfoot.  Discussed with patient about icing regimen.  Has a bilaterally.  Failed over-the-counter orthotics and do think custom orthotics will be beneficial for this in the antalgic gait.  Patient does have a large trip to [place] and will be doing a lot of walking.  Discussed hiking boots in greater detail.  Follow-up again in 6 to 8 weeks otherwise.

## 2022-11-21 NOTE — Patient Instructions (Addendum)
[facility] recovery [facility] will call you for orthotics See me in August

## 2022-11-22 DIAGNOSIS — E119 Type 2 diabetes mellitus without complications: Secondary | ICD-10-CM | POA: Diagnosis not present

## 2022-11-22 DIAGNOSIS — E1165 Type 2 diabetes mellitus with hyperglycemia: Secondary | ICD-10-CM | POA: Diagnosis not present

## 2022-11-29 ENCOUNTER — Ambulatory Visit: Payer: Medicare PPO | Admitting: Sports Medicine

## 2022-11-29 VITALS — BP 124/82 | Ht 64.0 in | Wt 167.0 lb

## 2022-11-29 DIAGNOSIS — M216X9 Other acquired deformities of unspecified foot: Secondary | ICD-10-CM

## 2022-11-29 NOTE — Progress Notes (Signed)
[name]  is here today as a referral from Dr. Antoine Primas for some custom orthotics.  She is going on a trip to [place] in a couple of weeks that she brought some of her walking tennis shoes with her today.  Bilateral feet, pes planus, loss of transverse arch, hammering of the toes 2 through 4 bilaterally. On her left and right plantar surface of her foot she is a small callus under her second metatarsal.  Patient was fitted for a standard, cushioned, semi-rigid orthotic. The orthotic was heated and afterward the patient stood on the orthotic base positioned on the orthotic stand. The patient was positioned in subtalar neutral position and 10 degrees of ankle dorsiflexion in a weight bearing stance. Size: 8 Base: None Additional Posting and Padding: Metatarsal pad bilaterally The patient ambulated these, and they were very comfortable.  Scaphoid pads were also placed in a pair of patient dress shoes to help with arch support.  We discussed with her to wear them for about a week and she can come back for adjustments as needed.  Addendum:  I was the preceptor for this visit and available for immediate consultation.  Norton Blizzard MD [name]

## 2022-12-06 DIAGNOSIS — H43813 Vitreous degeneration, bilateral: Secondary | ICD-10-CM | POA: Diagnosis not present

## 2022-12-06 DIAGNOSIS — H16223 Keratoconjunctivitis sicca, not specified as Sjogren's, bilateral: Secondary | ICD-10-CM | POA: Diagnosis not present

## 2022-12-06 DIAGNOSIS — H40053 Ocular hypertension, bilateral: Secondary | ICD-10-CM | POA: Diagnosis not present

## 2022-12-06 DIAGNOSIS — H2513 Age-related nuclear cataract, bilateral: Secondary | ICD-10-CM | POA: Diagnosis not present

## 2022-12-19 ENCOUNTER — Other Ambulatory Visit (HOSPITAL_COMMUNITY): Payer: Self-pay

## 2023-01-09 ENCOUNTER — Other Ambulatory Visit: Payer: Self-pay | Admitting: Internal Medicine

## 2023-01-09 DIAGNOSIS — E7801 Familial hypercholesterolemia: Secondary | ICD-10-CM

## 2023-01-10 ENCOUNTER — Ambulatory Visit: Payer: Medicare PPO | Attending: Internal Medicine

## 2023-01-10 DIAGNOSIS — E7801 Familial hypercholesterolemia: Secondary | ICD-10-CM | POA: Diagnosis not present

## 2023-01-16 ENCOUNTER — Encounter: Payer: Self-pay | Admitting: Internal Medicine

## 2023-01-16 ENCOUNTER — Ambulatory Visit: Payer: Medicare PPO | Attending: Internal Medicine | Admitting: Internal Medicine

## 2023-01-16 VITALS — BP 112/71 | HR 80 | Ht 64.0 in | Wt 164.8 lb

## 2023-01-16 DIAGNOSIS — E1159 Type 2 diabetes mellitus with other circulatory complications: Secondary | ICD-10-CM

## 2023-01-16 DIAGNOSIS — E7801 Familial hypercholesterolemia: Secondary | ICD-10-CM | POA: Diagnosis not present

## 2023-01-16 DIAGNOSIS — M791 Myalgia, unspecified site: Secondary | ICD-10-CM | POA: Diagnosis not present

## 2023-01-16 DIAGNOSIS — I2510 Atherosclerotic heart disease of native coronary artery without angina pectoris: Secondary | ICD-10-CM

## 2023-01-16 DIAGNOSIS — T466X5D Adverse effect of antihyperlipidemic and antiarteriosclerotic drugs, subsequent encounter: Secondary | ICD-10-CM

## 2023-01-16 DIAGNOSIS — T466X5A Adverse effect of antihyperlipidemic and antiarteriosclerotic drugs, initial encounter: Secondary | ICD-10-CM

## 2023-01-16 NOTE — Patient Instructions (Signed)
Medication Instructions:  NO CHANGES  *If you need a refill on your cardiac medications before your next appointment, please call your pharmacy*   Lab Work: FASTING lab work to check cholesterol in 6 months  If you have labs (blood work) drawn today and your tests are completely normal, you will receive your results only by: MyChart Message (if you have MyChart) OR A paper copy in the mail If you have any lab test that is abnormal or we need to change your treatment, we will call you to review the results.   Follow-Up: At [facility], you and your health needs are our priority.  As part of our continuing mission to provide you with exceptional heart care, we have created designated Provider Care Teams.  These Care Teams include your primary Cardiologist (physician) and Advanced Practice Providers (APPs -  Physician Assistants and Nurse Practitioners) who all work together to provide you with the care you need, when you need it.  We recommend signing up for the patient portal called "MyChart".  Sign up information is provided on this After Visit Summary.  MyChart is used to connect with patients for Virtual Visits (Telemedicine).  Patients are able to view lab/test results, encounter notes, upcoming appointments, etc.  Non-urgent messages can be sent to your provider as well.   To learn more about what you can do with MyChart, go to [email or web address].    Your next appointment:    6 months with Dr. Hilty 

## 2023-01-16 NOTE — Progress Notes (Unsigned)
LIPID CLINIC CONSULT NOTE  Chief Complaint:  Follow-up dyslipidemia, establish cardiologist  Primary Care Physician: [name], MD  Primary Cardiologist:  Chrystie Nose, MD  HPI:  Renee [name] is a 72 y.o. female who is being seen today for the evaluation of dyslipidemia at the request of [name], MD.  This is a pleasant 72 year old female patient of Dr. Katrinka Blazing with a history of hypertension, longstanding dyslipidemia which has been difficult to treat as well as a family history of dyslipidemia and heart disease in her mother.  She underwent CT coronary angiography in May 2019 which showed two-vessel coronary disease with mild disease in the proximal LAD and circumflex arteries as well as a calcium score 107, 80th percentile for age and sex matched control.  Unfortunately she has been intolerant of statins causing significant myalgias.  She was seen and treated by my pharmacist team in the lipid clinic who placed her on bempedoic acid.  She reports compliance with this and was also placed on Praluent 75 mg every 2 weeks.  Despite this, her lipids in April 2020 showed total cholesterol 264, triglycerides 87, HDL 66 and LDL 181.  2 years prior to that her LDL had been 116.  More recently her PCP drew lipids which showed total cholesterol 222, HDL 68, LDL 140 and triglycerides 81.  Hemoglobin A1c was 6.4.  Her target LDL is less than 70.  05/19/2020  [name] is seen today in follow-up.  She has had significant additional reduction in her cholesterol.  Labs were drawn a couple days ago and we receive them today.  Her total cholesterol is now 158, triglycerides 48, HDL 63 and an LDL of 85 (down from 140).  Overall she seems to be tolerating this combination of medications very well.  She is not quite at target LDL less than 70, I do think we will achieve that soon.  At this time she does not want to add or change any medications.  11/10/2020  [name] returns today for  follow-up.  She has been more physically active, having joint [facility].  Despite this, her cholesterol numbers have actually slightly worsened.  Total cholesterol is now 183 up from 158, triglycerides are 140, HDL 71 and LDL of 104 up from 85.  She continues on the Praluent and Nexletol.  Overall she is tolerating the medications well.  She reports compliance with them.  Her A1c is also slightly higher at 6.3, previously at 6.1.  This does suggest a dietary component.  She admits that she may have relaxed her diet a little bit more since she had such significant reduction in her cholesterol with the Praluent.  06/15/2021  [name] returns today for follow-up.  She has been working hard exercising here at the [employer] and fitness.  She is also made dietary changes.  Her cholesterol is accordingly improved with total now 139, triglycerides 62, HDL 41 and LDL 67  08/09/2022  [name] is seen today in follow-up of her lipids.  In the interim her cardiologist Dr. Katrinka Blazing is retired and she will be transitioning care to me for general cardiology.  She did have repeat lipids which showed a trend toward an increase in her cholesterol.  Her LDL is now 110 which is up from 97 last summer and 67 back in December 2022.  Total cholesterol 179, triglycerides 42 and HDL 60.  She reports compliance with both her Repatha and Nexletol.  She says there has been some discrepancy in the every 2-week dosing due to issues with getting her medication refills.  Part of that is because of the monthly refills.  She also has noted recently some fatigue, inability to lose weight on Ozempic and has recently had some hot and cold intolerance and weight gain.  I wondered if this might be related to her thyroid.  She also has a history of vitamin D deficiency in the past on 50,000 units but no longer takes vitamin D..  PMHx:  Past Medical History:  Diagnosis Date   Diabetes mellitus without  complication (HCC)    HSV infection    Hyperlipidemia    Hypertension     Past Surgical History:  Procedure Laterality Date   TOE SURGERY  1996   TUBAL LIGATION     WISDOM TOOTH EXTRACTION      FAMHx:  Family History  Problem Relation Age of Onset   Hyperlipidemia Mother    Hypertension Mother    Lung cancer Father    Cancer Father    Breast cancer Sister    Cancer Sister    Breast cancer Maternal Aunt    Cancer Maternal Aunt    Hyperlipidemia Maternal Grandmother    Hypertension Maternal Grandmother    Colon cancer Paternal Uncle    Cancer Paternal Uncle    Colon cancer Paternal Uncle    Diabetes Daughter     SOCHx:   reports that she has never smoked. She has never used smokeless tobacco. She reports current alcohol use. She reports that she does not use drugs.  ALLERGIES:  Allergies  Allergen Reactions   Codeine Nausea Only    ROS: Pertinent items noted in HPI and remainder of comprehensive ROS otherwise negative.  HOME MEDS: Current Outpatient Medications on File Prior to Visit  Medication Sig Dispense Refill   amLODipine (NORVASC) 5 MG tablet Take 1 tablet (5 mg total) by mouth daily. 90 tablet 3   aspirin 81 MG tablet Take 81 mg by mouth daily.     Blood Glucose Monitoring Suppl (ACCU-CHEK GUIDE) w/Device KIT as directed.     cholecalciferol (VITAMIN D3) 25 MCG (1000 UNIT) tablet Take 1,000 Units by mouth daily.     Evolocumab (REPATHA SURECLICK) 140 MG/ML SOAJ INJECT 140 MG INTO THE SKIN EVERY 14 DAYS 2 mL 11   glucose blood (ACCU-CHEK GUIDE) test strip as directed.     hydrOXYzine (ATARAX) 10 MG tablet Take 1 tablet (10 mg total) by mouth 3 (three) times daily as needed. (Patient not taking: Reported on 01/16/2023) 30 tablet 0   LUMIGAN 0.01 % SOLN Place 1 drop into both eyes every evening.     NEXLETOL 180 MG TABS TAKE 1 TABLET BY MOUTH EVERY DAY 30 tablet 11   RSV vaccine recomb adjuvanted (AREXVY) 120 MCG/0.5ML injection Inject into the muscle. 0.5 mL  0   Semaglutide (OZEMPIC, 1 MG/DOSE, [place]) Inject 1 mg into the skin once a week.     valACYclovir (VALTREX) 500 MG tablet Take 200 mg by mouth as needed.     valsartan-hydrochlorothiazide (DIOVAN-HCT) 320-25 MG tablet Take 1 tablet by mouth daily. 90 tablet 1   XIIDRA 5 % SOLN Apply 1 drop to eye 2 (two) times daily.     predniSONE (DELTASONE) 20 MG tablet Take 1 tablet (20 mg total) by mouth daily with breakfast. (Patient not taking: Reported on 01/16/2023) 7 tablet 0   No current facility-administered medications on file prior to  visit.    LABS/IMAGING: No results found for this or any previous visit (from the past 48 hour(s)). No results found.  LIPID PANEL:    Component Value Date/Time   CHOL 154 01/10/2023 0847   CHOL 346 (H) 06/17/2015 1431   TRIG 52 01/10/2023 0847   TRIG 89 06/17/2015 1431   HDL 62 01/10/2023 0847   HDL 71 06/17/2015 1431   CHOLHDL 2.5 01/10/2023 0847   CHOLHDL 4.9 06/17/2015 1431   LDLCALC 81 01/10/2023 0847   LDLCALC 257 (H) 06/17/2015 1431    WEIGHTS: Wt Readings from Last 3 Encounters:  01/16/23 164 lb 12.8 oz (74.8 kg)  11/29/22 167 lb (75.8 kg)  11/21/22 166 lb (75.3 kg)    VITALS: BP 112/71 (BP Location: Left Arm, Patient Position: Sitting, Cuff Size: Normal)   Pulse 80   Ht 5\' 4"  (1.626 m)   Wt 164 lb 12.8 oz (74.8 kg)   SpO2 97%   BMI 28.29 kg/m   EXAM: General appearance: alert Neck: no carotid bruit, no JVD, and thyroid not enlarged, symmetric, no tenderness/mass/nodules Lungs: clear to auscultation bilaterally Heart: regular rate and rhythm, S1, S2 normal, no murmur, click, rub or gallop Abdomen: soft, non-tender; bowel sounds normal; no masses,  no organomegaly Extremities: extremities normal, atraumatic, no cyanosis or edema Pulses: 2+ and symmetric Skin: Skin color, texture, turgor normal. No rashes or lesions Neurologic: Grossly normal  EKG: Normal sinus rhythm at 71-personally reviewed  ASSESSMENT: Probable familial  hyperlipidemia Two-vessel coronary disease with calcium score of 107, 80th percentile for age and sex matched control Family history of dyslipidemia and heart disease Statin intolerance-myalgias  PLAN: 1.   Ms. Collaso returns today for follow-up.  She is transitioning care from Dr. Katrinka Blazing to myself.  Her cholesterol has been creeping up somewhat despite compliance with Repatha and Nexletol.  Her LDL is now up to 110.  She has been on Ozempic but has not lost weight.  She is felt some hot and cold intolerance and fatigue and I wonder if she might be hypothyroid.  This has not been assessed in some time.  I would like to check a TSH and free T4 today.  Also it was noted that she had previously been on high-dose vitamin D but has not been on it recently.  I suspect she may have deficiency and we will assess that as well.  Plan repeat lipids in about 3 to 4 months and if any of her lab work is abnormal of course will be addressing that.  Chrystie Nose, MD, [facility], FACP  [facility]  Medical Director of the Advanced Lipid Disorders &  Cardiovascular Risk Reduction Clinic Diplomate of the American Board of Clinical Lipidology Attending Cardiologist  Direct Dial: [phone]  Fax: [phone]  Website:  [email or web address] Nicely Domanick Cuccia 01/16/2023, 1:53 PM

## 2023-01-19 NOTE — Progress Notes (Unsigned)
[name] [address] [place] [address] Phone: [phone] Subjective:   Renee [name], am serving as a scribe for Dr. Antoine Primas.  I'm seeing this patient by the request  of:  [name], MD  CC: Low back pain and foot pain follow-up  BJY:[id number]  Renee [name] is a 72 y.o. female coming in with complaint of back and neck pain. OMT 11/21/2022. Also f/u for B foot pain. Patient states that orthotics are helpful as well as OOFOS. Back pain is intermittent. Swimming in prone position increases her pain.   Is experiencing some vertigo with no pattern. Feels that she is still having post concussive symptoms.   Medications patient has been prescribed:   Taking:         Reviewed prior external information including notes and imaging from previsou exam, outside providers and external EMR if available.   As well as notes that were available from care everywhere and other healthcare systems.  Past medical history, social, surgical and family history all reviewed in electronic medical record.  No pertanent information unless stated regarding to the chief complaint.   Past Medical History:  Diagnosis Date   Diabetes mellitus without complication (HCC)    HSV infection    Hyperlipidemia    Hypertension     Allergies  Allergen Reactions   Codeine Nausea Only     Review of Systems:  No headache, visual changes, nausea, vomiting, diarrhea, constipation, dizziness, abdominal pain, skin rash, fevers, chills, night sweats, weight loss, swollen lymph nodes, body aches, joint swelling, chest pain, shortness of breath, mood changes. POSITIVE muscle aches  Objective  Blood pressure 106/68, pulse 77, height 5\' 4"  (1.626 m), weight 164 lb (74.4 kg), SpO2 99%.   General: No apparent distress alert and oriented x3 mood and affect normal, dressed appropriately.  HEENT: Pupils equal, extraocular movements intact  Respiratory:  Patient's speak in full sentences and does not appear short of breath  Cardiovascular: No lower extremity edema, non tender, no erythema  Low back still has some limited range of motion especially with extension noted.  Tenderness to palpation noted paraspinal musculature.   Osteopathic findings  C2 flexed rotated and side bent right C7 flexed rotated and side bent left T3 extended rotated and side bent right inhaled rib T7 extended rotated and side bent left L2 flexed rotated and side bent right L3 flexed rotated and side bent left Sacrum right on right       Assessment and Plan:  Low back pain Facet arthropathy but no very well.  Discussed icing regimen and home exercises.  Discussed core strengthening and avoiding certain extension.  Increase activity slowly.  Follow-up with me again in 6 to 8 weeks otherwise.  Arthritis of midfoot Significant improvement with the custom orthotics at this moment.  Discussed posture and ergonomics otherwise.  I discussed avoiding still walking barefoot.  Follow-up with me again in 6 to 8 weeks otherwise.    Nonallopathic problems  Decision today to treat with OMT was based on Physical Exam  After verbal consent patient was treated with HVLA, ME, FPR techniques in cervical, rib, thoracic, lumbar, and sacral  areas  Patient tolerated the procedure well with improvement in symptoms  Patient given exercises, stretches and lifestyle modifications  See medications in patient instructions if given  Patient will follow up in 8 weeks     The above documentation has been reviewed and is accurate and complete Judi Saa,  DO         Note: This dictation was prepared with Dragon dictation along with smaller phrase technology. Any transcriptional errors that result from this process are unintentional.

## 2023-01-23 ENCOUNTER — Encounter: Payer: Self-pay | Admitting: Family Medicine

## 2023-01-23 ENCOUNTER — Ambulatory Visit: Payer: Medicare PPO | Admitting: Family Medicine

## 2023-01-23 VITALS — BP 106/68 | HR 77 | Ht 64.0 in | Wt 164.0 lb

## 2023-01-23 DIAGNOSIS — M19071 Primary osteoarthritis, right ankle and foot: Secondary | ICD-10-CM

## 2023-01-23 DIAGNOSIS — M9901 Segmental and somatic dysfunction of cervical region: Secondary | ICD-10-CM

## 2023-01-23 DIAGNOSIS — M545 Low back pain, unspecified: Secondary | ICD-10-CM | POA: Diagnosis not present

## 2023-01-23 DIAGNOSIS — M9902 Segmental and somatic dysfunction of thoracic region: Secondary | ICD-10-CM | POA: Diagnosis not present

## 2023-01-23 DIAGNOSIS — G8929 Other chronic pain: Secondary | ICD-10-CM | POA: Diagnosis not present

## 2023-01-23 DIAGNOSIS — M19072 Primary osteoarthritis, left ankle and foot: Secondary | ICD-10-CM

## 2023-01-23 DIAGNOSIS — M9904 Segmental and somatic dysfunction of sacral region: Secondary | ICD-10-CM | POA: Diagnosis not present

## 2023-01-23 DIAGNOSIS — M9908 Segmental and somatic dysfunction of rib cage: Secondary | ICD-10-CM

## 2023-01-23 DIAGNOSIS — M9903 Segmental and somatic dysfunction of lumbar region: Secondary | ICD-10-CM | POA: Diagnosis not present

## 2023-01-23 NOTE — Assessment & Plan Note (Signed)
Facet arthropathy but no very well.  Discussed icing regimen and home exercises.  Discussed core strengthening and avoiding certain extension.  Increase activity slowly.  Follow-up with me again in 6 to 8 weeks otherwise.

## 2023-01-23 NOTE — Patient Instructions (Signed)
Doing great job Watch over ext of back Keep doing swimming See me in 2-3 months

## 2023-01-23 NOTE — Assessment & Plan Note (Signed)
Significant improvement with the custom orthotics at this moment.  Discussed posture and ergonomics otherwise.  I discussed avoiding still walking barefoot.  Follow-up with me again in 6 to 8 weeks otherwise.

## 2023-02-13 DIAGNOSIS — M2550 Pain in unspecified joint: Secondary | ICD-10-CM | POA: Diagnosis not present

## 2023-02-13 DIAGNOSIS — E669 Obesity, unspecified: Secondary | ICD-10-CM | POA: Diagnosis not present

## 2023-02-13 DIAGNOSIS — Z780 Asymptomatic menopausal state: Secondary | ICD-10-CM | POA: Diagnosis not present

## 2023-02-13 DIAGNOSIS — I10 Essential (primary) hypertension: Secondary | ICD-10-CM | POA: Diagnosis not present

## 2023-02-13 DIAGNOSIS — R911 Solitary pulmonary nodule: Secondary | ICD-10-CM | POA: Diagnosis not present

## 2023-02-13 DIAGNOSIS — Z0000 Encounter for general adult medical examination without abnormal findings: Secondary | ICD-10-CM | POA: Diagnosis not present

## 2023-02-13 DIAGNOSIS — E119 Type 2 diabetes mellitus without complications: Secondary | ICD-10-CM | POA: Diagnosis not present

## 2023-02-13 DIAGNOSIS — I2510 Atherosclerotic heart disease of native coronary artery without angina pectoris: Secondary | ICD-10-CM | POA: Diagnosis not present

## 2023-02-13 DIAGNOSIS — E785 Hyperlipidemia, unspecified: Secondary | ICD-10-CM | POA: Diagnosis not present

## 2023-02-23 ENCOUNTER — Ambulatory Visit: Payer: Medicare PPO | Admitting: Internal Medicine

## 2023-03-21 ENCOUNTER — Other Ambulatory Visit: Payer: Self-pay | Admitting: Obstetrics and Gynecology

## 2023-03-21 DIAGNOSIS — Z1239 Encounter for other screening for malignant neoplasm of breast: Secondary | ICD-10-CM

## 2023-04-02 ENCOUNTER — Other Ambulatory Visit (HOSPITAL_BASED_OUTPATIENT_CLINIC_OR_DEPARTMENT_OTHER): Payer: Self-pay

## 2023-04-02 MED ORDER — COVID-19 MRNA VAC-TRIS(PFIZER) 30 MCG/0.3ML IM SUSY
0.3000 mL | PREFILLED_SYRINGE | Freq: Once | INTRAMUSCULAR | 0 refills | Status: AC
  Filled 2023-04-02: qty 0.3, 1d supply, fill #0

## 2023-04-02 NOTE — Progress Notes (Unsigned)
[name] [address] [place] [address] Phone: [phone] Subjective:   Bruce Donath, am serving as a scribe for Dr. Antoine Primas.  I'm seeing this patient by the request  of:  [name], MD  CC: Back and neck pain follow-up  [id number]  Renee [name] is a 72 y.o. female coming in with complaint of back and neck pain. OMT 01/23/2023. Patient states that she had an increase in lower back pain and SI joint. Pain can wrap around into the abdomen. Denies any fever, nausea, vomiting. Feels better after moving around.   Medications patient has been prescribed: None  Taking:         Reviewed prior external information including notes and imaging from previsou exam, outside providers and external EMR if available.   As well as notes that were available from care everywhere and other healthcare systems.  Past medical history, social, surgical and family history all reviewed in electronic medical record.  No pertanent information unless stated regarding to the chief complaint.   Past Medical History:  Diagnosis Date   Diabetes mellitus without complication (HCC)    HSV infection    Hyperlipidemia    Hypertension     Allergies  Allergen Reactions   Codeine Nausea Only     Review of Systems:  No headache, visual changes, nausea, vomiting, diarrhea, constipation, dizziness, abdominal pain, skin rash, fevers, chills, night sweats, weight loss, swollen lymph nodes, body aches, joint swelling, chest pain, shortness of breath, mood changes. POSITIVE muscle aches  Objective  Blood pressure 110/72, pulse 87, height 5\' 4"  (1.626 m), weight 164 lb (74.4 kg), SpO2 99%.   General: No apparent distress alert and oriented x3 mood and affect normal, dressed appropriately.  HEENT: Pupils equal, extraocular movements intact  Respiratory: Patient's speak in full sentences and does not appear short of breath  Cardiovascular: No lower  extremity edema, non tender, no erythema  Low back does have some tension noted and seems to be mostly in the low back.  Does have limited sidebending as well as extension of the back fairly significantly.  Abdomen does have some fullness noted in the no masses appreciated.  Osteopathic findings  C2 flexed rotated and side bent right C5 flexed rotated and side bent left T3 extended rotated and side bent right inhaled rib T6 extended rotated and side bent left L2 flexed rotated and side bent right Sacrum right on right       Assessment and Plan:  Low back pain Concerned that some of the low back pain is actually viscerosomatic.  We discussed potential advanced imaging including an MRI of the pelvis with and without contrast.  Patient does have a large uterine fibroid.  Patient will follow-up with gynecology to discuss with imaging would be beneficial and see if this is potentially contributing.  No dysfunctional uterine bleeding at this moment.  No fevers, chills or any abnormal weight loss.  Will continue to work on core strengthening.  Follow-up again in 6 to 8 weeks    Nonallopathic problems  Decision today to treat with OMT was based on Physical Exam  After verbal consent patient was treated with HVLA, ME, FPR techniques in cervical, rib, thoracic, lumbar, and sacral  areas  Patient tolerated the procedure well with improvement in symptoms  Patient given exercises, stretches and lifestyle modifications  See medications in patient instructions if given  Patient will follow up in 4-8 weeks     The  above documentation has been reviewed and is accurate and complete Judi Saa, DO         Note: This dictation was prepared with Dragon dictation along with smaller phrase technology. Any transcriptional errors that result from this process are unintentional.

## 2023-04-03 ENCOUNTER — Encounter: Payer: Self-pay | Admitting: Family Medicine

## 2023-04-03 ENCOUNTER — Other Ambulatory Visit: Payer: Self-pay | Admitting: Internal Medicine

## 2023-04-03 ENCOUNTER — Ambulatory Visit: Payer: Medicare PPO | Admitting: Family Medicine

## 2023-04-03 VITALS — BP 110/72 | HR 87 | Ht 64.0 in | Wt 164.0 lb

## 2023-04-03 DIAGNOSIS — M545 Low back pain, unspecified: Secondary | ICD-10-CM

## 2023-04-03 DIAGNOSIS — M9904 Segmental and somatic dysfunction of sacral region: Secondary | ICD-10-CM | POA: Diagnosis not present

## 2023-04-03 DIAGNOSIS — M9901 Segmental and somatic dysfunction of cervical region: Secondary | ICD-10-CM

## 2023-04-03 DIAGNOSIS — G8929 Other chronic pain: Secondary | ICD-10-CM

## 2023-04-03 DIAGNOSIS — M9902 Segmental and somatic dysfunction of thoracic region: Secondary | ICD-10-CM

## 2023-04-03 DIAGNOSIS — M9903 Segmental and somatic dysfunction of lumbar region: Secondary | ICD-10-CM | POA: Diagnosis not present

## 2023-04-03 DIAGNOSIS — M9908 Segmental and somatic dysfunction of rib cage: Secondary | ICD-10-CM | POA: Diagnosis not present

## 2023-04-03 NOTE — Assessment & Plan Note (Signed)
Concerned that some of the low back pain is actually viscerosomatic.  We discussed potential advanced imaging including an MRI of the pelvis with and without contrast.  Patient does have a large uterine fibroid.  Patient will follow-up with gynecology to discuss with imaging would be beneficial and see if this is potentially contributing.  No dysfunctional uterine bleeding at this moment.  No fevers, chills or any abnormal weight loss.  Will continue to work on core strengthening.  Follow-up again in 6 to 8 weeks

## 2023-04-04 DIAGNOSIS — B354 Tinea corporis: Secondary | ICD-10-CM | POA: Diagnosis not present

## 2023-04-04 DIAGNOSIS — M545 Low back pain, unspecified: Secondary | ICD-10-CM | POA: Diagnosis not present

## 2023-04-04 DIAGNOSIS — D259 Leiomyoma of uterus, unspecified: Secondary | ICD-10-CM | POA: Diagnosis not present

## 2023-04-05 DIAGNOSIS — H40053 Ocular hypertension, bilateral: Secondary | ICD-10-CM | POA: Diagnosis not present

## 2023-04-05 DIAGNOSIS — H43813 Vitreous degeneration, bilateral: Secondary | ICD-10-CM | POA: Diagnosis not present

## 2023-04-05 DIAGNOSIS — H16223 Keratoconjunctivitis sicca, not specified as Sjogren's, bilateral: Secondary | ICD-10-CM | POA: Diagnosis not present

## 2023-04-05 DIAGNOSIS — H2513 Age-related nuclear cataract, bilateral: Secondary | ICD-10-CM | POA: Diagnosis not present

## 2023-04-05 DIAGNOSIS — E119 Type 2 diabetes mellitus without complications: Secondary | ICD-10-CM | POA: Diagnosis not present

## 2023-04-17 ENCOUNTER — Ambulatory Visit
Admission: RE | Admit: 2023-04-17 | Discharge: 2023-04-17 | Disposition: A | Discharge status: Home / Self Care | Payer: Medicare PPO | Source: Ambulatory Visit | Attending: Obstetrics and Gynecology | Admitting: Obstetrics and Gynecology

## 2023-04-17 DIAGNOSIS — Z1231 Encounter for screening mammogram for malignant neoplasm of breast: Secondary | ICD-10-CM | POA: Diagnosis not present

## 2023-04-17 DIAGNOSIS — Z1239 Encounter for other screening for malignant neoplasm of breast: Secondary | ICD-10-CM

## 2023-04-23 ENCOUNTER — Other Ambulatory Visit: Payer: Self-pay | Admitting: Obstetrics and Gynecology

## 2023-04-23 DIAGNOSIS — B354 Tinea corporis: Secondary | ICD-10-CM | POA: Diagnosis not present

## 2023-04-23 DIAGNOSIS — D259 Leiomyoma of uterus, unspecified: Secondary | ICD-10-CM | POA: Diagnosis not present

## 2023-04-23 DIAGNOSIS — R928 Other abnormal and inconclusive findings on diagnostic imaging of breast: Secondary | ICD-10-CM

## 2023-04-23 DIAGNOSIS — R102 Pelvic and perineal pain: Secondary | ICD-10-CM | POA: Diagnosis not present

## 2023-04-25 DIAGNOSIS — Z7982 Long term (current) use of aspirin: Secondary | ICD-10-CM | POA: Diagnosis not present

## 2023-04-25 DIAGNOSIS — Z7985 Long-term (current) use of injectable non-insulin antidiabetic drugs: Secondary | ICD-10-CM | POA: Diagnosis not present

## 2023-04-25 DIAGNOSIS — E1165 Type 2 diabetes mellitus with hyperglycemia: Secondary | ICD-10-CM | POA: Diagnosis not present

## 2023-05-07 ENCOUNTER — Ambulatory Visit
Admission: RE | Admit: 2023-05-07 | Discharge: 2023-05-07 | Disposition: A | Discharge status: Home / Self Care | Payer: Medicare PPO | Source: Ambulatory Visit | Attending: Obstetrics and Gynecology | Admitting: Obstetrics and Gynecology

## 2023-05-07 ENCOUNTER — Other Ambulatory Visit: Payer: Self-pay | Admitting: Obstetrics and Gynecology

## 2023-05-07 DIAGNOSIS — R928 Other abnormal and inconclusive findings on diagnostic imaging of breast: Secondary | ICD-10-CM

## 2023-05-07 DIAGNOSIS — M6281 Muscle weakness (generalized): Secondary | ICD-10-CM | POA: Diagnosis not present

## 2023-05-07 DIAGNOSIS — N6489 Other specified disorders of breast: Secondary | ICD-10-CM

## 2023-05-07 DIAGNOSIS — R921 Mammographic calcification found on diagnostic imaging of breast: Secondary | ICD-10-CM | POA: Insufficient documentation

## 2023-05-07 DIAGNOSIS — N3941 Urge incontinence: Secondary | ICD-10-CM | POA: Diagnosis not present

## 2023-05-07 DIAGNOSIS — M545 Low back pain, unspecified: Secondary | ICD-10-CM | POA: Diagnosis not present

## 2023-05-14 ENCOUNTER — Ambulatory Visit
Admission: RE | Admit: 2023-05-14 | Discharge: 2023-05-14 | Disposition: A | Discharge status: Home / Self Care | Payer: Medicare PPO | Source: Ambulatory Visit | Attending: Obstetrics and Gynecology | Admitting: Obstetrics and Gynecology

## 2023-05-14 DIAGNOSIS — R921 Mammographic calcification found on diagnostic imaging of breast: Secondary | ICD-10-CM

## 2023-05-14 DIAGNOSIS — N6092 Unspecified benign mammary dysplasia of left breast: Secondary | ICD-10-CM | POA: Diagnosis not present

## 2023-05-14 DIAGNOSIS — R928 Other abnormal and inconclusive findings on diagnostic imaging of breast: Secondary | ICD-10-CM

## 2023-05-14 HISTORY — PX: BREAST BIOPSY: SHX20

## 2023-05-15 ENCOUNTER — Other Ambulatory Visit: Payer: Self-pay | Admitting: Family Medicine

## 2023-05-15 DIAGNOSIS — R928 Other abnormal and inconclusive findings on diagnostic imaging of breast: Secondary | ICD-10-CM

## 2023-05-15 LAB — SURGICAL PATHOLOGY

## 2023-05-17 DIAGNOSIS — Z1211 Encounter for screening for malignant neoplasm of colon: Secondary | ICD-10-CM | POA: Diagnosis not present

## 2023-05-17 DIAGNOSIS — N6092 Unspecified benign mammary dysplasia of left breast: Secondary | ICD-10-CM | POA: Insufficient documentation

## 2023-05-17 DIAGNOSIS — Z711 Person with feared health complaint in whom no diagnosis is made: Secondary | ICD-10-CM | POA: Diagnosis not present

## 2023-05-18 DIAGNOSIS — R5383 Other fatigue: Secondary | ICD-10-CM | POA: Diagnosis not present

## 2023-05-18 DIAGNOSIS — E119 Type 2 diabetes mellitus without complications: Secondary | ICD-10-CM | POA: Diagnosis not present

## 2023-05-18 DIAGNOSIS — M2550 Pain in unspecified joint: Secondary | ICD-10-CM | POA: Diagnosis not present

## 2023-05-18 DIAGNOSIS — I700 Atherosclerosis of aorta: Secondary | ICD-10-CM | POA: Diagnosis not present

## 2023-05-21 DIAGNOSIS — R5383 Other fatigue: Secondary | ICD-10-CM | POA: Diagnosis not present

## 2023-05-21 DIAGNOSIS — M2550 Pain in unspecified joint: Secondary | ICD-10-CM | POA: Diagnosis not present

## 2023-05-22 ENCOUNTER — Ambulatory Visit
Admission: RE | Admit: 2023-05-22 | Discharge: 2023-05-22 | Disposition: A | Discharge status: Home / Self Care | Payer: Medicare PPO | Source: Ambulatory Visit | Attending: Family Medicine | Admitting: Family Medicine

## 2023-05-22 DIAGNOSIS — N6012 Diffuse cystic mastopathy of left breast: Secondary | ICD-10-CM | POA: Diagnosis not present

## 2023-05-22 DIAGNOSIS — R928 Other abnormal and inconclusive findings on diagnostic imaging of breast: Secondary | ICD-10-CM

## 2023-05-22 DIAGNOSIS — N6321 Unspecified lump in the left breast, upper outer quadrant: Secondary | ICD-10-CM | POA: Diagnosis not present

## 2023-05-22 HISTORY — PX: BREAST BIOPSY: SHX20

## 2023-05-23 LAB — SURGICAL PATHOLOGY

## 2023-05-25 DIAGNOSIS — B354 Tinea corporis: Secondary | ICD-10-CM | POA: Insufficient documentation

## 2023-05-28 DIAGNOSIS — Z803 Family history of malignant neoplasm of breast: Secondary | ICD-10-CM | POA: Diagnosis not present

## 2023-05-28 DIAGNOSIS — B354 Tinea corporis: Secondary | ICD-10-CM | POA: Diagnosis not present

## 2023-05-28 DIAGNOSIS — N62 Hypertrophy of breast: Secondary | ICD-10-CM | POA: Diagnosis not present

## 2023-05-29 DIAGNOSIS — H16223 Keratoconjunctivitis sicca, not specified as Sjogren's, bilateral: Secondary | ICD-10-CM | POA: Diagnosis not present

## 2023-05-29 DIAGNOSIS — H43813 Vitreous degeneration, bilateral: Secondary | ICD-10-CM | POA: Diagnosis not present

## 2023-05-29 DIAGNOSIS — H40053 Ocular hypertension, bilateral: Secondary | ICD-10-CM | POA: Diagnosis not present

## 2023-06-11 ENCOUNTER — Telehealth: Payer: Self-pay | Admitting: Internal Medicine

## 2023-06-11 NOTE — Telephone Encounter (Signed)
Renee Nose, MD  Jonah Blue, RN; Lindell Spar, RN Caller: Unspecified (Today, 12:31 PM) Yes .Marland Kitchen I'm happy to see her for general cardiology as well. Please make sure the visit is specified as general cardiology - she would also get an EKG a that visit.  Dr. Rennis Golden         Message routed back to scheduling team Bronson Ing) to assist with general cardiology appt

## 2023-06-11 NOTE — Telephone Encounter (Signed)
Pt states she is seeing Dr Rennis Golden for Lipids but really hasn't seen anyone for her gen cards since Dr Katrinka Blazing left. She wants to know if Dr Rennis Golden will do both.

## 2023-06-15 ENCOUNTER — Other Ambulatory Visit: Payer: Self-pay | Admitting: *Deleted

## 2023-06-15 DIAGNOSIS — E7801 Familial hypercholesterolemia: Secondary | ICD-10-CM

## 2023-06-18 ENCOUNTER — Other Ambulatory Visit: Payer: Self-pay | Admitting: General Surgery

## 2023-06-18 DIAGNOSIS — Z800 Family history of malignant neoplasm of digestive organs: Secondary | ICD-10-CM | POA: Diagnosis not present

## 2023-06-18 DIAGNOSIS — Z803 Family history of malignant neoplasm of breast: Secondary | ICD-10-CM | POA: Diagnosis not present

## 2023-06-18 DIAGNOSIS — R921 Mammographic calcification found on diagnostic imaging of breast: Secondary | ICD-10-CM | POA: Diagnosis not present

## 2023-06-18 DIAGNOSIS — N6092 Unspecified benign mammary dysplasia of left breast: Secondary | ICD-10-CM

## 2023-06-18 DIAGNOSIS — N6082 Other benign mammary dysplasias of left breast: Secondary | ICD-10-CM | POA: Diagnosis not present

## 2023-06-19 NOTE — Progress Notes (Signed)
 [name] [address] [place] [address] Phone: [phone] Subjective:    I'm seeing this patient by the request  of:  Frederik Charleston, MD  CC: Low back pain follow-up  YEP:Dlagzrupcz  Renee [name] is a 73 y.o. female coming in with complaint of back and neck pain. OMT 04/03/2023. Patient states back pain has gotten better, but over the past week patient has had low back upper buttock pain on the left side making it hard for her to walk standing straight.   Medications patient has been prescribed: None  Taking:         Reviewed prior external information including notes and imaging from previsou exam, outside providers and external EMR if available.   As well as notes that were available from care everywhere and other healthcare systems.  Past medical history, social, surgical and family history all reviewed in electronic medical record.  No pertanent information unless stated regarding to the chief complaint.   Past Medical History:  Diagnosis Date   Diabetes mellitus without complication (HCC)    HSV infection    Hyperlipidemia    Hypertension     Allergies  Allergen Reactions   Codeine Nausea Only     Review of Systems:  No headache, visual changes, nausea, vomiting, diarrhea, constipation, dizziness, abdominal pain, skin rash, fevers, chills, night sweats, weight loss, swollen lymph nodes, body aches, joint swelling, chest pain, shortness of breath, mood changes. POSITIVE muscle aches  Objective  Blood pressure 110/70, pulse 86, height 5' 4 (1.626 m), SpO2 99%.   General: No apparent distress alert and oriented x3 mood and affect normal, dressed appropriately.  HEENT: Pupils equal, extraocular movements intact  Respiratory: Patient's speak in full sentences and does not appear short of breath  Cardiovascular: No lower extremity edema, non tender, no erythema  MSK:  Back does have some loss lordosis noted.  Some  tenderness to palpation in the paraspinal musculature.  Negative straight leg test.  Neurovascular intact distally.  Has some mild difficulty with the getting up from a seated position.  Osteopathic findings  C2 flexed rotated and side bent right C6 flexed rotated and side bent left T3 extended rotated and side bent right inhaled rib T9 extended rotated and side bent left L2 flexed rotated and side bent right Sacrum right on right     Assessment and Plan:  Low back pain Low back does have some loss lordosis noted.  Some tenderness generative changes noted.  Tightness with Deri right greater than left.  Negative straight leg test noted today.  Continue to work on air cabin crew, continue to work on strength and conditioning.  Increase activity slowly.  Follow-up with me again in 6 to 8 weeks otherwise.    Nonallopathic problems  Decision today to treat with OMT was based on Physical Exam  After verbal consent patient was treated with HVLA, ME, FPR techniques in cervical, rib, thoracic, lumbar, and sacral  areas  Patient tolerated the procedure well with improvement in symptoms  Patient given exercises, stretches and lifestyle modifications  See medications in patient instructions if given  Patient will follow up in 4-8 weeks    The above documentation has been reviewed and is accurate and complete Sierah Lacewell M Mica Releford, DO          Note: This dictation was prepared with Dragon dictation along with smaller phrase technology. Any transcriptional errors that result from this process are unintentional.

## 2023-06-20 ENCOUNTER — Other Ambulatory Visit: Payer: Self-pay | Admitting: General Surgery

## 2023-06-20 ENCOUNTER — Encounter: Payer: Self-pay | Admitting: General Surgery

## 2023-06-20 DIAGNOSIS — N6092 Unspecified benign mammary dysplasia of left breast: Secondary | ICD-10-CM

## 2023-06-21 ENCOUNTER — Ambulatory Visit: Payer: Medicare PPO | Admitting: Family Medicine

## 2023-06-21 ENCOUNTER — Encounter: Payer: Self-pay | Admitting: Family Medicine

## 2023-06-21 VITALS — BP 110/70 | HR 86 | Ht 64.0 in

## 2023-06-21 DIAGNOSIS — M9902 Segmental and somatic dysfunction of thoracic region: Secondary | ICD-10-CM

## 2023-06-21 DIAGNOSIS — M9901 Segmental and somatic dysfunction of cervical region: Secondary | ICD-10-CM

## 2023-06-21 DIAGNOSIS — M9903 Segmental and somatic dysfunction of lumbar region: Secondary | ICD-10-CM

## 2023-06-21 DIAGNOSIS — M545 Low back pain, unspecified: Secondary | ICD-10-CM

## 2023-06-21 DIAGNOSIS — M9908 Segmental and somatic dysfunction of rib cage: Secondary | ICD-10-CM

## 2023-06-21 DIAGNOSIS — M9904 Segmental and somatic dysfunction of sacral region: Secondary | ICD-10-CM

## 2023-06-21 DIAGNOSIS — G8929 Other chronic pain: Secondary | ICD-10-CM | POA: Diagnosis not present

## 2023-06-21 NOTE — Assessment & Plan Note (Addendum)
 Low back does have some loss lordosis noted.  Some tenderness generative changes noted.  Tightness with Deri right greater than left.  Negative straight leg test noted today.  Continue to work on air cabin crew, continue to work on strength and conditioning.  Increase activity slowly.  Follow-up with me again in 6 to 8 weeks otherwise.  Total time with patient today not including osteopathic manipulation and 31 minutes

## 2023-06-21 NOTE — Patient Instructions (Signed)
 Good to see you Miralax and colace daily when needed  See me again in 7-8 weeks

## 2023-06-29 DIAGNOSIS — L309 Dermatitis, unspecified: Secondary | ICD-10-CM | POA: Diagnosis not present

## 2023-06-29 DIAGNOSIS — R194 Change in bowel habit: Secondary | ICD-10-CM | POA: Diagnosis not present

## 2023-06-29 DIAGNOSIS — E119 Type 2 diabetes mellitus without complications: Secondary | ICD-10-CM | POA: Diagnosis not present

## 2023-06-29 DIAGNOSIS — R300 Dysuria: Secondary | ICD-10-CM | POA: Diagnosis not present

## 2023-07-03 IMAGING — DX DG KNEE STANDING AP BILAT
1 series · 1 of 1 positions shown · non-contrast
Comparison: None.

CLINICAL DATA: Chronic bilateral knee pain

EXAM:
BILATERAL KNEES STANDING - 1 VIEW

[knee ap]
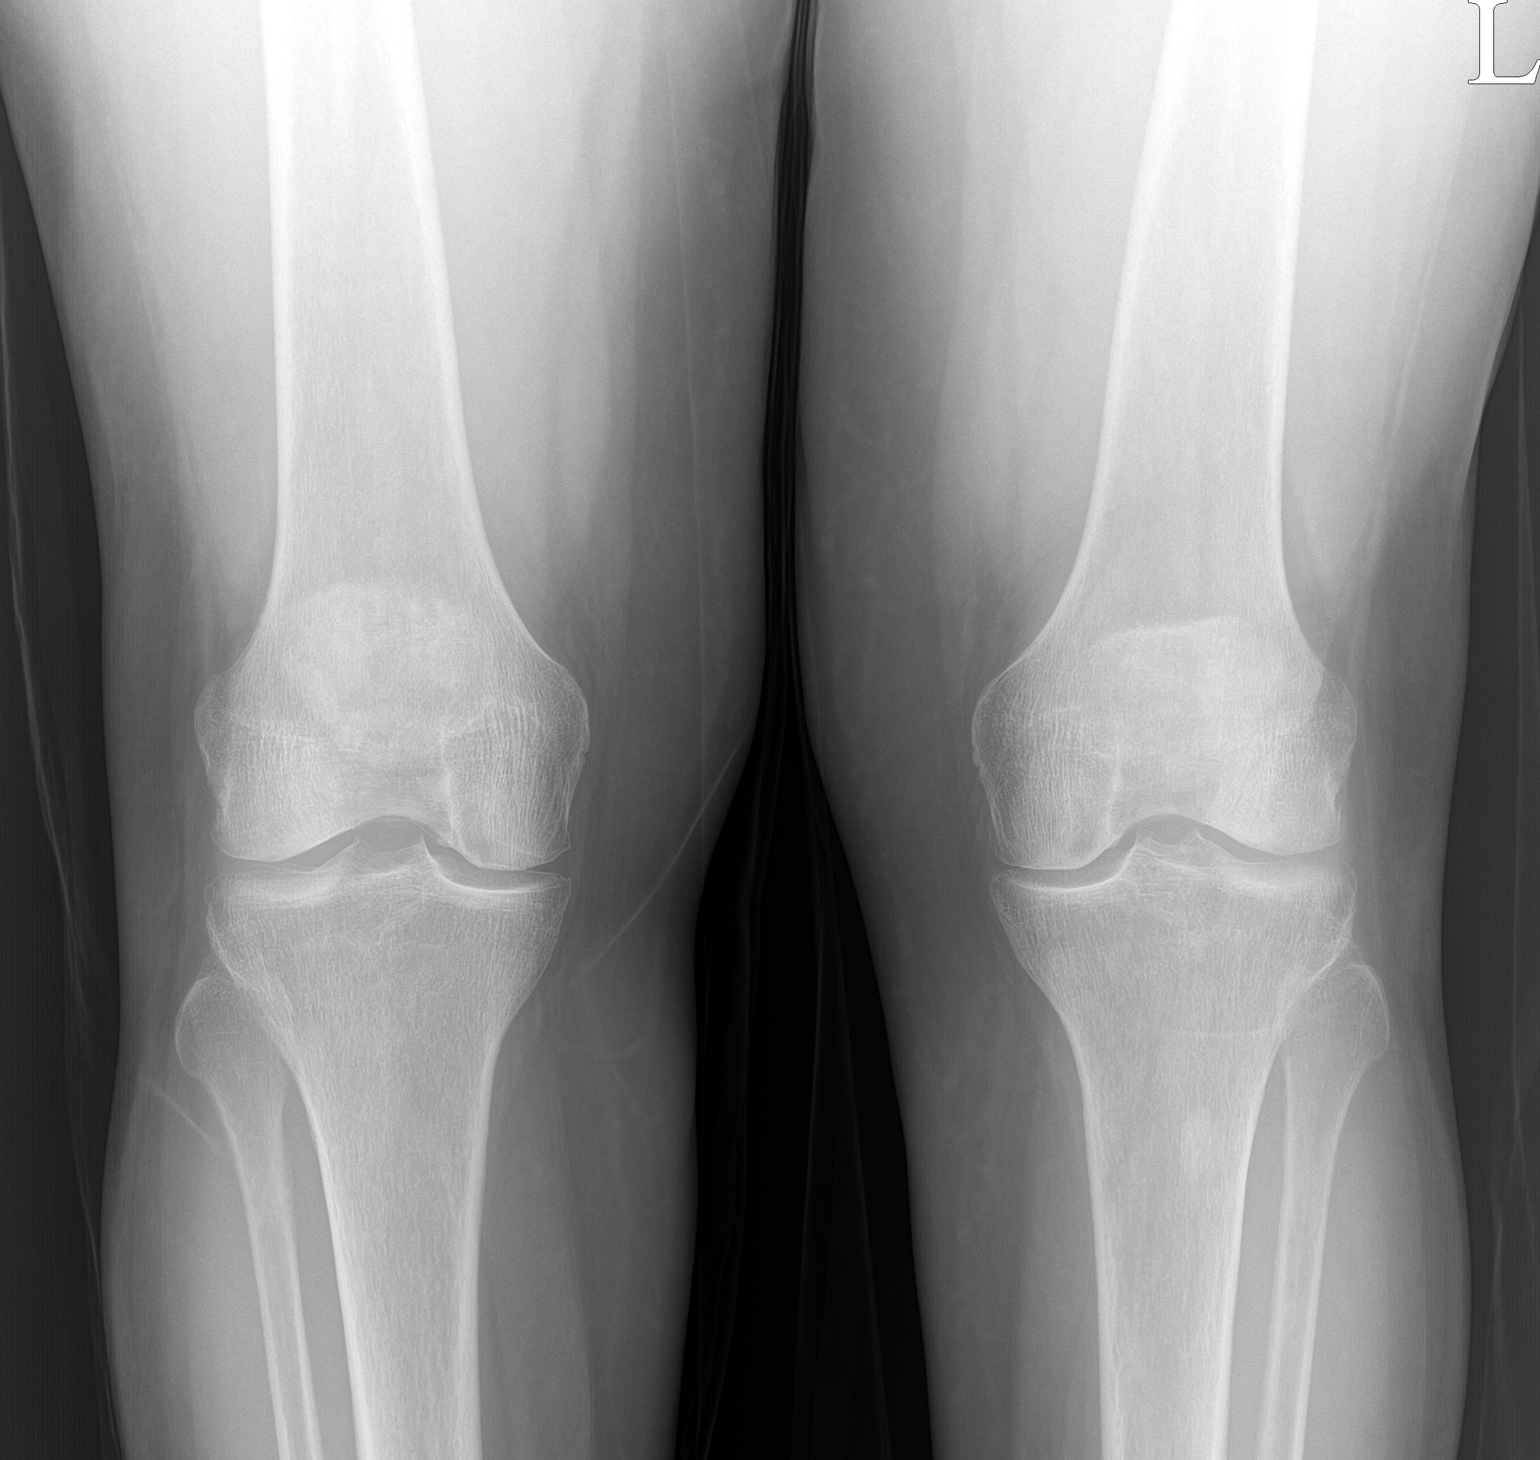

[1 of 1 positions shown; findings below may reference images not displayed]

FINDINGS: Upright frontal views of the bilateral knees are obtained. On the
right, joint spaces are relatively well preserved. On the left,
there is mild medial and lateral compartmental joint space
narrowing. No acute fractures. Soft tissues are unremarkable.
IMPRESSION: 1. Mild left knee osteoarthritis.
2. Unremarkable right knee.

## 2023-07-03 IMAGING — DX DG LUMBAR SPINE 2-3V
3 series · 3 of 3 positions shown · non-contrast
Comparison: None

CLINICAL DATA: Low back pain for 3 months

EXAM:
LUMBAR SPINE - 2-3 VIEW

[l-spine ap]
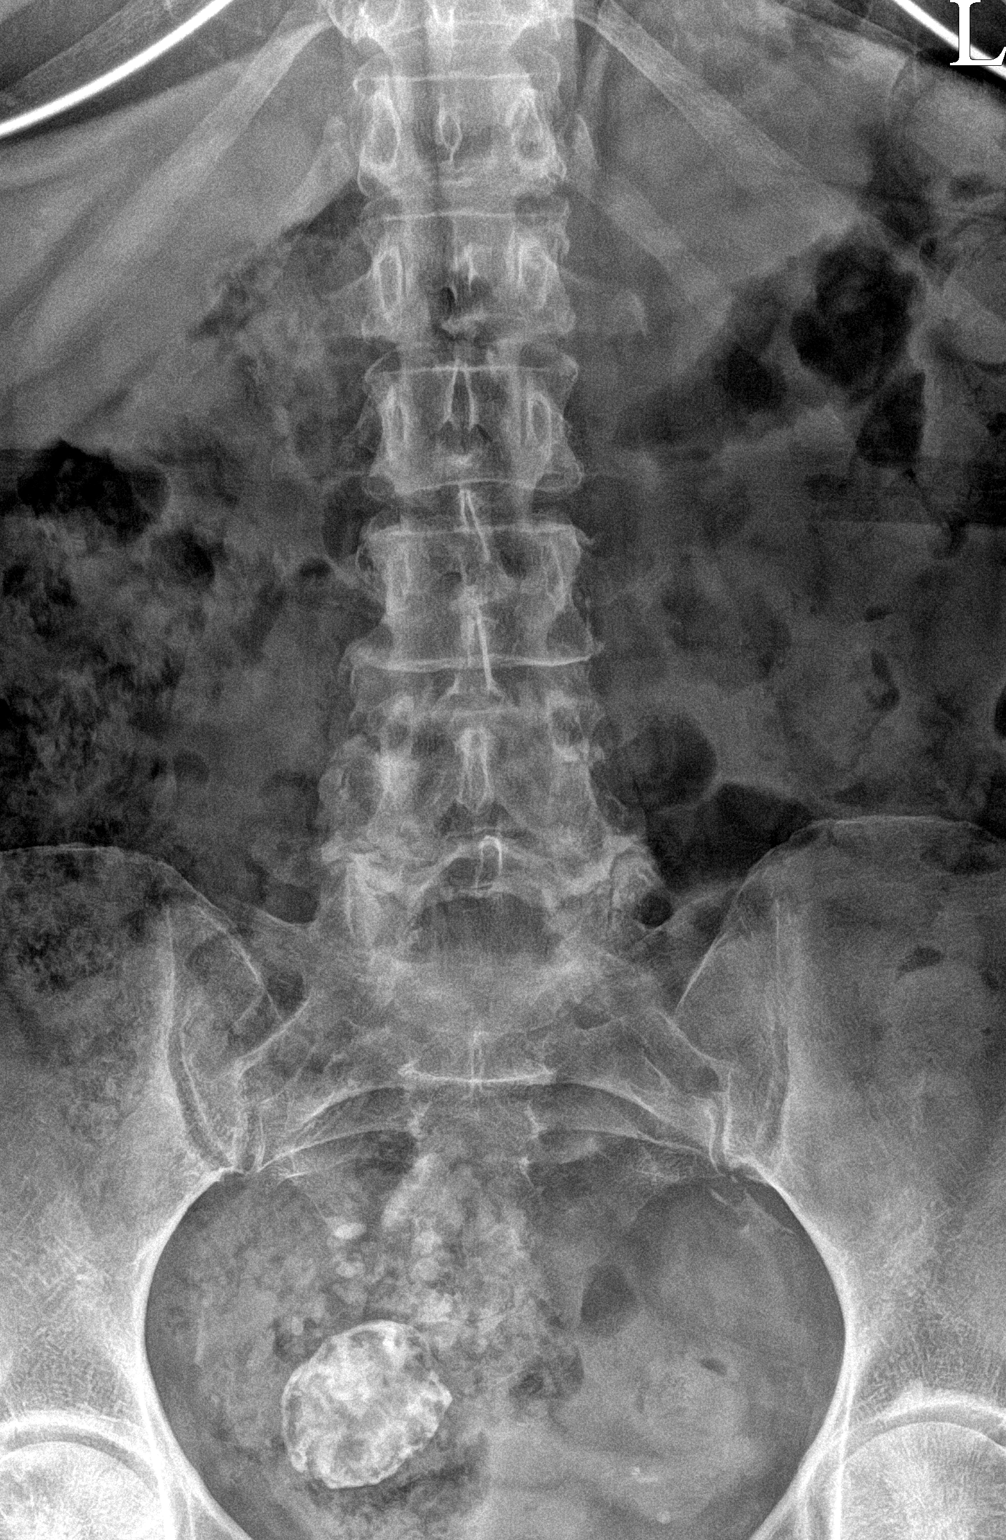

[l-spine lateral]
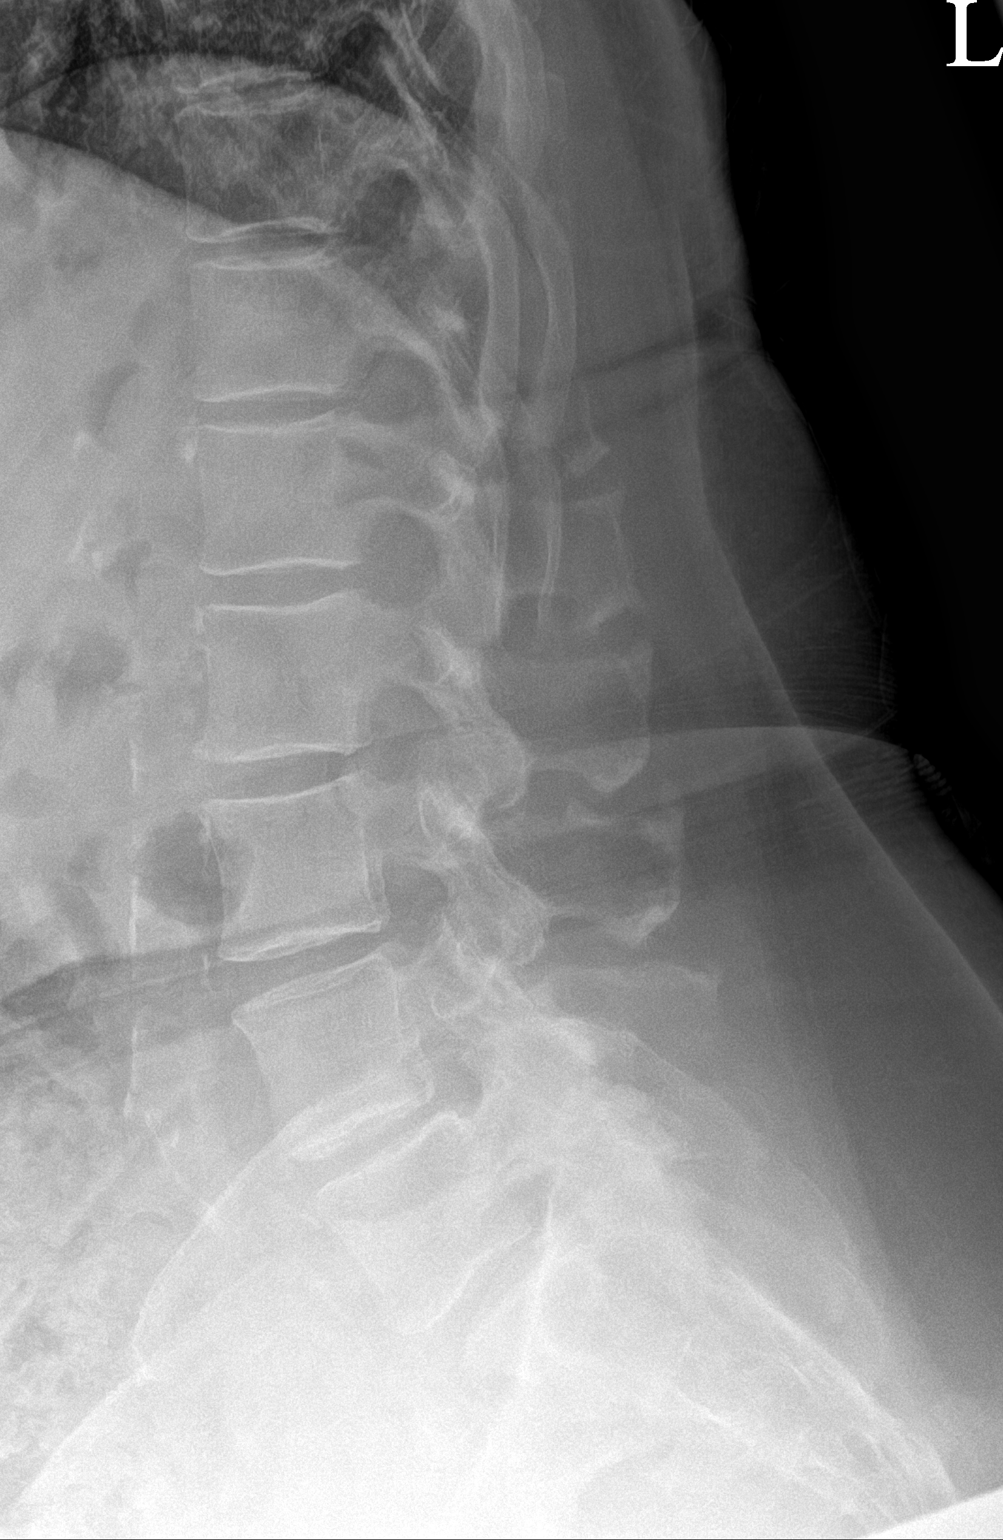

[l-spine spot]
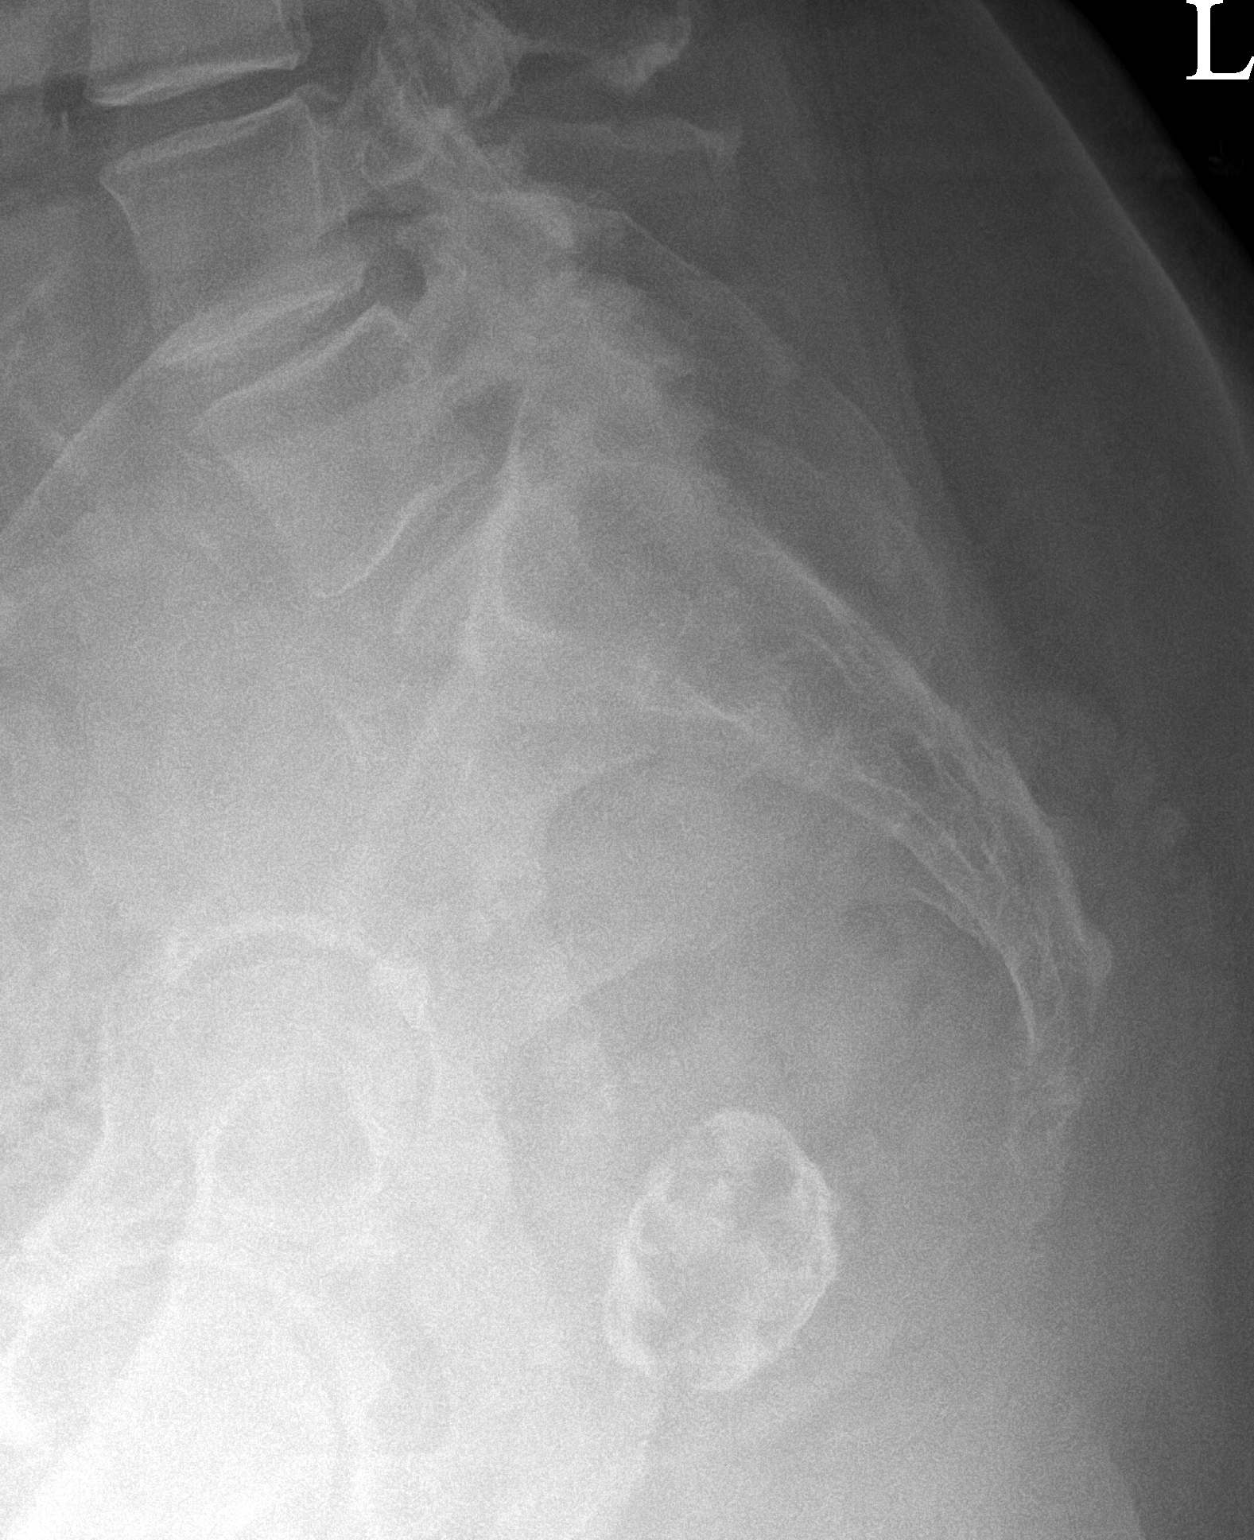

[3 of 3 positions shown; findings below may reference images not displayed]

FINDINGS: Frontal and lateral views of the lumbar spine are obtained. There
are 5 non-rib-bearing lumbar type vertebral bodies identified, with
grade 1 anterolisthesis of L5 on S1. No evidence of pars defect.
There is mild multilevel spondylosis greatest at L4-5. There is
significant facet hypertrophy at L4-5 and L5-S1. No acute fractures.
Sacroiliac joints are unremarkable. Large rounded calcification
within the lower pelvis consistent with calcified uterine fibroid.
IMPRESSION: 1. Prominent lower lumbar spondylosis and facet hypertrophy.
2. Mild anterolisthesis of L5 on S1, without evidence of pars
defect.
3. Pelvic calcification most consistent with calcified uterine
fibroid.

## 2023-07-12 ENCOUNTER — Encounter (HOSPITAL_BASED_OUTPATIENT_CLINIC_OR_DEPARTMENT_OTHER): Payer: Self-pay | Admitting: General Surgery

## 2023-07-18 DIAGNOSIS — Z01818 Encounter for other preprocedural examination: Secondary | ICD-10-CM

## 2023-07-27 ENCOUNTER — Ambulatory Visit: Payer: Medicare PPO | Attending: Internal Medicine | Admitting: Internal Medicine

## 2023-07-27 VITALS — BP 116/78 | HR 75 | Ht 64.0 in | Wt 162.8 lb

## 2023-07-27 DIAGNOSIS — T466X5D Adverse effect of antihyperlipidemic and antiarteriosclerotic drugs, subsequent encounter: Secondary | ICD-10-CM

## 2023-07-27 DIAGNOSIS — M791 Myalgia, unspecified site: Secondary | ICD-10-CM

## 2023-07-27 DIAGNOSIS — I2510 Atherosclerotic heart disease of native coronary artery without angina pectoris: Secondary | ICD-10-CM | POA: Diagnosis not present

## 2023-07-27 DIAGNOSIS — E7801 Familial hypercholesterolemia: Secondary | ICD-10-CM | POA: Diagnosis not present

## 2023-07-27 DIAGNOSIS — E78019 Familial hypercholesterolemia, unspecified: Secondary | ICD-10-CM

## 2023-07-27 DIAGNOSIS — T466X5A Adverse effect of antihyperlipidemic and antiarteriosclerotic drugs, initial encounter: Secondary | ICD-10-CM

## 2023-07-27 NOTE — Progress Notes (Signed)
LIPID CLINIC CONSULT NOTE  Chief Complaint:  Follow-up  Primary Care Physician: [name], MD  Primary Cardiologist:  Chrystie Nose, MD  HPI:  Renee [name] is a 73 y.o. female who is being seen today for the evaluation of dyslipidemia at the request of [name], MD.  This is a pleasant 73 year old female patient of Dr. Katrinka Blazing with a history of hypertension, longstanding dyslipidemia which has been difficult to treat as well as a family history of dyslipidemia and heart disease in her mother.  She underwent CT coronary angiography in May 2019 which showed two-vessel coronary disease with mild disease in the proximal LAD and circumflex arteries as well as a calcium score 107, 80th percentile for age and sex matched control.  Unfortunately she has been intolerant of statins causing significant myalgias.  She was seen and treated by my pharmacist team in the lipid clinic who placed her on bempedoic acid.  She reports compliance with this and was also placed on Praluent 75 mg every 2 weeks.  Despite this, her lipids in April 2020 showed total cholesterol 264, triglycerides 87, HDL 66 and LDL 181.  2 years prior to that her LDL had been 116.  More recently her PCP drew lipids which showed total cholesterol 222, HDL 68, LDL 140 and triglycerides 81.  Hemoglobin A1c was 6.4.  Her target LDL is less than 70.  05/19/2020  Renee [name] is seen today in follow-up.  She has had significant additional reduction in her cholesterol.  Labs were drawn a couple days ago and we receive them today.  Her total cholesterol is now 158, triglycerides 48, HDL 63 and an LDL of 85 (down from 140).  Overall she seems to be tolerating this combination of medications very well.  She is not quite at target LDL less than 70, I do think we will achieve that soon.  At this time she does not want to add or change any medications.  11/10/2020  Renee [name] returns today for follow-up.  She has been more  physically active, having joint [facility].  Despite this, her cholesterol numbers have actually slightly worsened.  Total cholesterol is now 183 up from 158, triglycerides are 140, HDL 71 and LDL of 104 up from 85.  She continues on the Praluent and Nexletol.  Overall she is tolerating the medications well.  She reports compliance with them.  Her A1c is also slightly higher at 6.3, previously at 6.1.  This does suggest a dietary component.  She admits that she may have relaxed her diet a little bit more since she had such significant reduction in her cholesterol with the Praluent.  06/15/2021  Renee [name] returns today for follow-up.  She has been working hard exercising here at the [employer] and fitness.  She is also made dietary changes.  Her cholesterol is accordingly improved with total now 139, triglycerides 62, HDL 41 and LDL 67  08/09/2022  [name] is seen today in follow-up of her lipids.  In the interim her cardiologist Dr. Katrinka Blazing is retired and she will be transitioning care to me for general cardiology.  She did have repeat lipids which showed a trend toward an increase in her cholesterol.  Her LDL is now 110 which is up from 97 last summer and 67 back in December 2022.  Total cholesterol 179, triglycerides 42 and HDL 60.  She reports compliance with both her Repatha and Nexletol.  She says  there has been some discrepancy in the every 2-week dosing due to issues with getting her medication refills.  Part of that is because of the monthly refills.  She also has noted recently some fatigue, inability to lose weight on Ozempic and has recently had some hot and cold intolerance and weight gain.  I wondered if this might be related to her thyroid.  She also has a history of vitamin D deficiency in the past on 50,000 units but no longer takes vitamin D.  01/16/2023  Renee [name] returns today for follow-up.  He underwent recent repeat lipid testing and has had  a decrease in her cholesterol.  Total now 154, triglycerides 52, HDL 62 and LDL 81.  She reports more regular compliance with her medications and diet.  Overall she says she is feeling well.  07/27/2023  Renee [name] is seen today in follow-up.  I am now following her for general cardiology and lipid management since Dr. Katrinka Blazing retired.  Overall she is doing well.  Blood pressure is well-controlled today.  She had lipid testing last July showing total cholesterol 154 and LDL of 81.  It does sound like she had a recent abnormal breast biopsy and she is scheduled for left breast radioactive seed implants in February.  From a cardiac standpoint she is at acceptable risk for this.  PMHx:  Past Medical History:  Diagnosis Date   Diabetes mellitus without complication (HCC)    HSV infection    Hyperlipidemia    Hypertension     Past Surgical History:  Procedure Laterality Date   BREAST BIOPSY Right    BREAST BIOPSY Left 05/14/2023   MM LT BREAST BX W LOC DEV 1ST LESION IMAGE BX SPEC STEREO GUIDE 05/14/2023 [facility]   BREAST BIOPSY Left 05/22/2023   [place] LT BREAST BX W LOC DEV 1ST LESION IMG BX SPEC US GUIDE 05/22/2023 [facility]   TOE SURGERY  06/12/1994   TUBAL LIGATION     WISDOM TOOTH EXTRACTION      FAMHx:  Family History  Problem Relation Age of Onset   Hyperlipidemia Mother    Hypertension Mother    Lung cancer Father    Cancer Father    Breast cancer Sister    Cancer Sister    Breast cancer Maternal Aunt    Cancer Maternal Aunt    Hyperlipidemia Maternal Grandmother    Hypertension Maternal Grandmother    Colon cancer Paternal Uncle    Cancer Paternal Uncle    Colon cancer Paternal Uncle    Diabetes Daughter     SOCHx:   reports that she has never smoked. She has never used smokeless tobacco. She reports current alcohol use. She reports that she does not use drugs.  ALLERGIES:  Allergies  Allergen Reactions   Codeine Nausea Only    ROS: Pertinent  items noted in HPI and remainder of comprehensive ROS otherwise negative.  HOME MEDS: Current Outpatient Medications on File Prior to Visit  Medication Sig Dispense Refill   amLODipine (NORVASC) 5 MG tablet Take 1 tablet (5 mg total) by mouth daily. 90 tablet 3   aspirin 81 MG tablet Take 81 mg by mouth daily.     Blood Glucose Monitoring Suppl (ACCU-CHEK GUIDE) w/Device KIT as directed.     Evolocumab (REPATHA SURECLICK) 140 MG/ML SOAJ INJECT 140 MG INTO THE SKIN EVERY 14 DAYS 2 mL 11   glucose blood (ACCU-CHEK GUIDE) test strip as directed.     LUMIGAN 0.01 %  SOLN Place 1 drop into both eyes every evening.     NEXLETOL 180 MG TABS TAKE 1 TABLET BY MOUTH EVERY DAY 30 tablet 11   Semaglutide (OZEMPIC, 1 MG/DOSE, [place]) Inject 1 mg into the skin once a week.     valACYclovir (VALTREX) 500 MG tablet Take 200 mg by mouth as needed.     valsartan-hydrochlorothiazide (DIOVAN-HCT) 320-25 MG tablet TAKE 1 TABLET BY MOUTH EVERY DAY 90 tablet 3   XIIDRA 5 % SOLN Apply 1 drop to eye 2 (two) times daily.     cholecalciferol (VITAMIN D3) 25 MCG (1000 UNIT) tablet Take 1,000 Units by mouth daily. (Patient not taking: Reported on 07/27/2023)     hydrOXYzine (ATARAX) 10 MG tablet Take 1 tablet (10 mg total) by mouth 3 (three) times daily as needed. (Patient not taking: Reported on 07/27/2023) 30 tablet 0   No current facility-administered medications on file prior to visit.    LABS/IMAGING: No results found for this or any previous visit (from the past 48 hours). No results found.  LIPID PANEL:    Component Value Date/Time   CHOL 154 01/10/2023 0847   CHOL 346 (H) 06/17/2015 1431   TRIG 52 01/10/2023 0847   TRIG 89 06/17/2015 1431   HDL 62 01/10/2023 0847   HDL 71 06/17/2015 1431   CHOLHDL 2.5 01/10/2023 0847   CHOLHDL 4.9 06/17/2015 1431   LDLCALC 81 01/10/2023 0847   LDLCALC 257 (H) 06/17/2015 1431    WEIGHTS: Wt Readings from Last 3 Encounters:  07/27/23 162 lb 12.8 oz (73.8 kg)  04/03/23  164 lb (74.4 kg)  01/23/23 164 lb (74.4 kg)    VITALS: BP 116/78 (BP Location: Left Arm, Patient Position: Sitting, Cuff Size: Normal)   Pulse 75   Ht 5\' 4"  (1.626 m)   Wt 162 lb 12.8 oz (73.8 kg)   BMI 27.94 kg/m   EXAM: General appearance: alert and no distress Neck: no carotid bruit, no JVD, and thyroid not enlarged, symmetric, no tenderness/mass/nodules Lungs: clear to auscultation bilaterally Heart: regular rate and rhythm, S1, S2 normal, no murmur, click, rub or gallop Abdomen: soft, non-tender; bowel sounds normal; no masses,  no organomegaly Extremities: extremities normal, atraumatic, no cyanosis or edema Pulses: 2+ and symmetric Skin: Skin color, texture, turgor normal. No rashes or lesions Neurologic: Grossly normal  EKG: EKG Interpretation Date/Time:  Friday July 27 2023 15:24:40 EST Ventricular Rate:  75 PR Interval:  162 QRS Duration:  70 QT Interval:  384 QTC Calculation: 428 R Axis:   -5  Text Interpretation: Normal sinus rhythm Normal ECG No significant change since last tracing Confirmed by Zoila Shutter [phone]) on 07/27/2023 3:27:05 PM    ASSESSMENT: Probable familial hyperlipidemia Two-vessel coronary disease with calcium score of 107, 80th percentile for age and sex matched control (10/2017) Family history of dyslipidemia and heart disease Statin intolerance-myalgias  PLAN: 1.   Ms. Ezra seems to be doing well.  She has not had her lipids reassessed since July.  Will need to recheck these since she was above a target LDL less than 70.  She does have some coronary calcification.  From a cardiac standpoint should be okay to proceed with radiation seed implants which is low risk.  I will contact her with the results of her lipid testing and if further adjustments may need to be made I will do that.  Plan otherwise follow-up in 1 year or sooner as necessary.  Chrystie Nose, MD, [facility], FACP  [place]  [facility]  Medical Director of  the Advanced Lipid Disorders &  Cardiovascular Risk Reduction Clinic Diplomate of the American Board of Clinical Lipidology Attending Cardiologist  Direct Dial: [phone]  Fax: [phone]  Website:  [email or web address] 07/27/2023, 3:27 PM

## 2023-07-27 NOTE — Patient Instructions (Signed)
Medication Instructions:  No changes  Lab Work: Lipid Panel (Fasting); please come back next week (or as as soon as you are able to) to have labs drawn.  Testing/Procedures: None   Follow-Up: At [facility], you and your health needs are our priority.  As part of our continuing mission to provide you with exceptional heart care, we have created designated Provider Care Teams.  These Care Teams include your primary Cardiologist (physician) and Advanced Practice Providers (APPs -  Physician Assistants and Nurse Practitioners) who all work together to provide you with the care you need, when you need it.   Your next appointment:   12 month(s)  Provider:   Chrystie Nose, MD

## 2023-07-31 NOTE — Progress Notes (Signed)
  [name] [address] [place] [address] Phone: [phone] Subjective:   Renee [name], am serving as a scribe for Dr. Antoine Primas.  I'm seeing this patient by the request  of:  [name], MD  CC: back and neck pain follow up   [id number]  Renee [name] is a 73 y.o. female coming in with complaint of back and neck pain. OMT 06/21/2023. Patient states back is the same. Left foot feels a bit off, discomfort. Wants to know about shoes and if it's time to get new ones. Has a neck traction machine at home, would it be beneficial to use.  Medications patient has been prescribed: None  Taking:         Reviewed prior external information including notes and imaging from previsou exam, outside providers and external EMR if available.   As well as notes that were available from care everywhere and other healthcare systems.  Past medical history, social, surgical and family history all reviewed in electronic medical record.  No pertanent information unless stated regarding to the chief complaint.   Past Medical History:  Diagnosis Date   Diabetes mellitus without complication (HCC)    HSV infection    Hyperlipidemia    Hypertension     Allergies  Allergen Reactions   Codeine Nausea Only     Review of Systems:  No headache, visual changes, nausea, vomiting, diarrhea, constipation, dizziness, abdominal pain, skin rash, fevers, chills, night sweats, weight loss, swollen lymph nodes, body aches, joint swelling, chest pain, shortness of breath, mood changes. POSITIVE muscle aches  Objective  Blood pressure 124/78, pulse 81, height 5\' 4"  (1.626 m), weight 163 lb (73.9 kg), SpO2 98%.   General: No apparent distress alert and oriented x3 mood and affect normal, dressed appropriately.  HEENT: Pupils equal, extraocular movements intact  Respiratory: Patient's speak in full sentences and does not appear short of breath   Cardiovascular: No lower extremity edema, non tender, no erythema  Low back does have loss of lordosis, very tight musculature  Negative SLT  Osteopathic findings  C3 flexed rotated and side bent right C6 flexed rotated and side bent left T3 extended rotated and side bent right inhaled rib T7 extended rotated and side bent left L2 flexed rotated and side bent right L5 F RS Left  Sacrum right on right       Assessment and Plan:  Low back pain Low back does have loss of lordosis  Been very stressed  Discussed HEP  Discussed which activities to do  Increase activity to what it can do  RTC in 5-6 weeks     Nonallopathic problems  Decision today to treat with OMT was based on Physical Exam  After verbal consent patient was treated with HVLA, ME, FPR techniques in cervical, rib, thoracic, lumbar, and sacral  areas  Patient tolerated the procedure well with improvement in symptoms  Patient given exercises, stretches and lifestyle modifications  See medications in patient instructions if given  Patient will follow up in 4-8 weeks     The above documentation has been reviewed and is accurate and complete Judi Saa, DO         Note: This dictation was prepared with Dragon dictation along with smaller phrase technology. Any transcriptional errors that result from this process are unintentional.

## 2023-08-01 ENCOUNTER — Other Ambulatory Visit: Payer: Self-pay

## 2023-08-01 ENCOUNTER — Encounter (HOSPITAL_BASED_OUTPATIENT_CLINIC_OR_DEPARTMENT_OTHER): Payer: Self-pay | Admitting: General Surgery

## 2023-08-01 NOTE — Progress Notes (Signed)
   08/01/23 1029  PAT Phone Screen  Is the patient taking a GLP-1 receptor agonist? (S)  Yes (Ozempic LD 07-27-23)  Has the patient been informed on holding medication? Yes  Do You Have Diabetes? Yes  Do You Have Hypertension? Yes  Have You Ever Been to the ER for Asthma? No  Have You Taken Oral Steroids in the Past 3 Months? No  Do you Take Phenteramine or any Other Diet Drugs? No  Recent  Lab Work, EKG, CXR? Yes  Where was this test performed? 07-27-23 EKG  Do you have a history of heart problems? (S)  No (sees Dr Hilty-cards for HTN, hyperlipidemia)  Any Recent Hospitalizations? No  Height 5\' 4"  (1.626 m)  Weight 74.4 kg  Pat Appointment Scheduled (S)  Yes (BMP)

## 2023-08-06 ENCOUNTER — Ambulatory Visit: Payer: Medicare PPO | Admitting: Family Medicine

## 2023-08-06 ENCOUNTER — Encounter (HOSPITAL_BASED_OUTPATIENT_CLINIC_OR_DEPARTMENT_OTHER)
Admission: RE | Admit: 2023-08-06 | Discharge: 2023-08-06 | Disposition: A | Discharge status: Home / Self Care | Payer: Medicare PPO | Source: Ambulatory Visit | Attending: General Surgery | Admitting: General Surgery

## 2023-08-06 ENCOUNTER — Encounter: Payer: Self-pay | Admitting: Family Medicine

## 2023-08-06 VITALS — BP 124/78 | HR 81 | Ht 64.0 in | Wt 163.0 lb

## 2023-08-06 DIAGNOSIS — Z01818 Encounter for other preprocedural examination: Secondary | ICD-10-CM | POA: Diagnosis not present

## 2023-08-06 DIAGNOSIS — M9902 Segmental and somatic dysfunction of thoracic region: Secondary | ICD-10-CM | POA: Diagnosis not present

## 2023-08-06 DIAGNOSIS — M9903 Segmental and somatic dysfunction of lumbar region: Secondary | ICD-10-CM | POA: Diagnosis not present

## 2023-08-06 DIAGNOSIS — M9908 Segmental and somatic dysfunction of rib cage: Secondary | ICD-10-CM | POA: Diagnosis not present

## 2023-08-06 DIAGNOSIS — M545 Low back pain, unspecified: Secondary | ICD-10-CM | POA: Diagnosis not present

## 2023-08-06 DIAGNOSIS — M9904 Segmental and somatic dysfunction of sacral region: Secondary | ICD-10-CM

## 2023-08-06 DIAGNOSIS — M9901 Segmental and somatic dysfunction of cervical region: Secondary | ICD-10-CM | POA: Diagnosis not present

## 2023-08-06 DIAGNOSIS — G8929 Other chronic pain: Secondary | ICD-10-CM | POA: Diagnosis not present

## 2023-08-06 LAB — BASIC METABOLIC PANEL
Anion gap: 8 (ref 5–15)
BUN: 18 mg/dL (ref 8–23)
CO2: 26 mmol/L (ref 22–32)
Calcium: 10 mg/dL (ref 8.9–10.3)
Chloride: 106 mmol/L (ref 98–111)
Creatinine, Ser: 1 mg/dL (ref 0.44–1.00)
GFR, Estimated: 60 mL/min — ABNORMAL LOW (ref >60)
Glucose, Bld: 121 mg/dL — ABNORMAL HIGH (ref 70–99)
Potassium: 3.7 mmol/L (ref 3.5–5.1)
Sodium: 140 mmol/L (ref 135–145)

## 2023-08-06 MED ORDER — CHLORHEXIDINE GLUCONATE CLOTH 2 % EX PADS: 6.0000 | MEDICATED_PAD | Freq: Once | CUTANEOUS | Status: DC

## 2023-08-06 NOTE — Assessment & Plan Note (Signed)
 Low back does have loss of lordosis  Been very stressed  Discussed HEP  Discussed which activities to do  Increase activity to what it can do  RTC in 5-6 weeks

## 2023-08-06 NOTE — Patient Instructions (Signed)
 Vibrum bottom to shoes for standing [place] for walking. Transition for neck. See in you 6 to 8 weeks.

## 2023-08-06 NOTE — Progress Notes (Signed)

## 2023-08-06 NOTE — H&P (Signed)
 EFERRING PHYSICIAN:  Annia Belt   PROVIDER:  Matthias Hughs, MD   Care Team: Patient Care Team: Barbaraann Boys, MD as PCP - General (Family Medicine)    MRN: [id number] DOB: [birth year] DATE OF ENCOUNTER: 06/18/2023   Subjective    Chief Complaint: New Consultation       History of Present Illness: Renee [name] is a 73 y.o. female who is seen today as an office consultation at the request of Dr. Abigail Miyamoto for evaluation of New Consultation .     Patient presents with a new diagnosis of left breast atypical ductal hyperplasia diagnosed December 2024.  The patient had abnormal screening mammogram.  Diagnostic imaging was then performed which showed a cluster of probably benign cyst at 1:00 measuring 8 mm.  There were also indeterminant calcifications in the upper outer quadrant that were 3 mm in size.  These were both biopsied.  The cluster of cysts was benign with adenosis and fibrocystic changes.  The other lesion was atypical ductal hyperplasia over the calcifications were.  She was recommended to see me to discuss excision.   Patient is very active at the [employer] and fitness center.  She does water aerobics and rowing in addition to walking there.   Family cancer history -sister died of breast cancer around age 50 and was diagnosed around age 91.  2 maternal aunts had breast cancer.  Paternal uncle and multiple paternal cousins have had colon cancer.     Diagnostic mammogram and ultrasound 05/07/2023 [facility] ACR Breast Density Category b: There are scattered areas of fibroglandular density.  FINDINGS: Additional imaging of the left breast was performed. There is an asymmetry associated with calcifications in the upper-outer quadrant of the breast. The calcifications span an area of 3 mm.  On physical exam, I do not palpate a mass in the upper-outer quadrant of the left breast.  Targeted ultrasound is performed, showing probable cluster of benign cysts in the  left breast at 1 o'clock 10 cm from the nipple measuring 8 x 2 x 5 mm.  IMPRESSION: Indeterminate calcifications in the upper-outer quadrant of the left breast spanning 3 mm.  Probable benign cluster of cysts in the left breast at 1 o'clock 10 cm from the nipple.  RECOMMENDATION: Stereotactic biopsy of the calcifications in the upper-outer quadrant of the left breast is recommended.  If the calcifications are benign short-term interval follow-up left breast ultrasound in 6 months is recommended for the probable cluster of cysts at 1 o'clock 10 cm from the nipple.  I have discussed the findings and recommendations with the patient. If applicable, a reminder letter will be sent to the patient regarding the next appointment.  BI-RADS CATEGORY  4: Suspicious.   Pathology core needle biopsy: 05/14/2023 1. Breast, left, needle core biopsy, upper outer quad calcs :       -  ATYPICAL DUCTAL HYPERPLASIA.       -  CALCIFICATIONS PRESENT.       NOTE: THERE ARE FOCAL DUCTS WITH MICROPAPILLARY FORMATION IN THE BACKGROUND OF       APOCRINE METAPLASIA AS WELL AS A SEPARATE DUCT WITH FOCAL RIGIDITY IN THE       BACKGROUND OF USUAL DUCTAL HYPERPLASIA.  THE ABOVE FINDINGS ARE CONSISTENT WITH       ATYPICAL DUCTAL HYPERPLASIA.    05/22/2023 1. Breast, left, needle core biopsy, upper outer, 1 o'clock, 10cmfn, (ribbon clip) :       -  ADENOSIS AND FIBROCYSTIC CHANGES  WITH APOCRINEMETAPLASIA       -  NO MALIGNANCY IDENTIFIED        Review of Systems: A complete review of systems was obtained from the patient.  I have reviewed this information and discussed as appropriate with the patient.  See HPI as well for other ROS. ROS otherwise negative.        Medical History: Past Medical History      Past Medical History:  Diagnosis Date   Diabetes mellitus type 2, uncomplicated (CMS/HHS-HCC)     Diplopia     Glaucoma (increased eye pressure)     Hyperlipidemia     Hypertension          Problem  List     Patient Active Problem List  Diagnosis   Sixth (abducent) nerve palsy, left eye   Monocular esotropia of left eye   Diplopia   RMR Recess 12.32mm from limbus, adjustable, LLR resect 6.65mm   Glaucoma suspect of both eyes   s/p LMR recession on adjustable (5.5 mm) ([place] 12/18/16)   Atypical ductal hyperplasia of left breast   Family history of breast cancer   Family history of colon cancer        Past Surgical History       Past Surgical History:  Procedure Laterality Date   STRABISMUS SURGERY W/PLACEMENT ADJUSTABLE SUTURE Right 07/05/2015    Procedure: Adjustable suture;  Surgeon: Fernande Bras, MD;  [facility];  Service: Ophthalmology;  Laterality: Right;   STRABISMUS SURGERY 1 HORIZONTAL MUSCLE MEDIAL/LATERAL Bilateral 07/05/2015    Procedure: Right medial rectus recession 12mm from limbus, adjustable Left lateral rectus resection 6mm;  Surgeon: Fernande Bras, MD;  [facility];  Service: Ophthalmology;  Laterality: Bilateral;   STRABISMUS SURGERY W/PLACEMENT ADJUSTABLE SUTURE Left 12/18/2016    Procedure: PLACEMENT OF ADJUSTABLE SUTURE(S) DURING STRABISMUS SURGERY, INCLUDING POSTOPERATIVE ADJUSTMENT(S) OF SUTURE(S) (LIST N ADDITION TO SPECIFIC STRABISMUS SURGERY);  Surgeon: Fernande Bras, MD;  [facility];  Service: Ophthalmology;  Laterality: Left;   STRABISMUS SURGERY 1 HORIZONTAL MUSCLE MEDIAL/LATERAL Left 12/18/2016    Procedure: STRABISMUS SURGERY, RECESSION OR RESECTION PROCEDURE; 1 HORIZONTAL MUSCLE;  Surgeon: Fernande Bras, MD;  [facility];  Service: Ophthalmology;  Laterality: Left;   COLONOSCOPY       HALLUX VALGUS CORRECTION Bilateral     tooth extraction            Allergies      Allergies  Allergen Reactions   Codeine Nausea        Medications Ordered Prior to Encounter        Current Outpatient Medications on File Prior to Visit  Medication Sig Dispense Refill   amLODIPine (NORVASC) 5 MG  tablet Take 5 mg by mouth every morning.      11   aspirin 81 MG EC tablet Take 81 mg by mouth once daily       LUMIGAN 0.01 % ophthalmic solution Place 1 drop into both eyes at bedtime       NEXLETOL 180 mg tablet Take 180 mg by mouth once daily       REPATHA SURECLICK 140 mg/mL PnIj INJECT 140 MG INTO THE SKIN EVERY 14 DAYS       valsartan-hydrochlorothiazide (DIOVAN-HCT) 320-25 mg tablet Take 1 tablet by mouth every morning.      11   aspirin (ADULT LOW DOSE ASPIRIN) 81 MG EC tablet Take by mouth every morning.   (Patient  not taking: Reported on 06/18/2023)       dorzolamide-timolol (COSOPT) 22.3-6.8 mg/mL ophthalmic solution Place 1 drop into both eyes once daily. Reported on 07/13/2015  (Patient not taking: Reported on 06/18/2023)       metFORMIN (GLUCOPHAGE) 500 MG tablet Take 500 mg by mouth once daily. Taking 2 tablets once a day. (Patient not taking: Reported on 06/18/2023)       neomycin-polymyxin-dexamethasone (MAXITROL) ophthalmic suspension Place 1 drop into the left eye as directed. Use 3 times a day for 1 week. (Patient not taking: Reported on 06/18/2023) 10 mL 0   prednisoLONE acetate (PRED FORTE) 1 % ophthalmic suspension Place 1 drop into the left eye as directed. 1 week after surgery: Use in the left eye 4 times a day for 1 week, then taper as directed. (Patient not taking: Reported on 06/18/2023) 10 mL 1    No current facility-administered medications on file prior to visit.        Family History       Family History  Problem Relation Age of Onset   Obesity Mother     High blood pressure (Hypertension) Mother     Breast cancer Sister     Anesthesia problems Neg Hx          Tobacco Use History  Social History       Tobacco Use  Smoking Status Never  Smokeless Tobacco Never        Social History  Social History         Socioeconomic History   Marital status: Single  Tobacco Use   Smoking status: Never   Smokeless tobacco: Never  Substance and Sexual Activity    Alcohol use: Not Currently      Comment: wine on the weekends   Drug use: No    Social Drivers of Health        Housing Stability: Unknown (06/18/2023)    Housing Stability Vital Sign     Homeless in the Last Year: No        Objective:          Vitals:    06/18/23 1051 06/18/23 1056  BP: 120/82    Pulse: 72    Temp: 36.8 C (98.2 F)    SpO2: 98%    Weight: 74.8 kg (165 lb)    Height: 162.6 cm (5\' 4" )    PainSc:   0-No pain  PainLoc:   Breast    Body mass index is 28.32 kg/m.     Gen:  No acute distress.  Well nourished and well groomed.   Neurological: Alert and oriented to person, place, and time. Coordination normal.  Head: Normocephalic and atraumatic.  Eyes: Conjunctivae are normal. Pupils are equal, round, and reactive to light. No scleral icterus.  Neck: Normal range of motion. Neck supple. No tracheal deviation or thyromegaly present.  Cardiovascular: Normal rate, regular rhythm, normal heart sounds and intact distal pulses.  Exam reveals no gallop and no friction rub.  No murmur heard. Breast: Breasts are ptotic bilaterally.  Left breast larger than right.  No palpable masses.  No nipple retraction or nipple discharge.  No axillary lymphadenopathy.  No skin contour changes. Respiratory: Effort normal.  No respiratory distress. No chest wall tenderness. Breath sounds normal.  No wheezes, rales or rhonchi.  GI: Soft. Bowel sounds are normal. The abdomen is soft and nontender.  There is no rebound and no guarding.  Musculoskeletal: Normal range of motion. Extremities are nontender.  Lymphadenopathy: No cervical, preauricular, postauricular or axillary adenopathy is present Skin: Skin is warm and dry. No rash noted. No diaphoresis. No erythema. No pallor. No clubbing, cyanosis, or edema.   Psychiatric: Normal mood and affect. Behavior is normal. Judgment and thought content normal.      Labs None available   Assessment and Plan:        ICD-10-CM    1. Family  history of breast cancer  Z80.3 Ambulatory Referral to Cancer Genetics     2. Atypical ductal hyperplasia of left breast  N60.92       3. Family history of colon cancer  Z80.0 Ambulatory Referral to Cancer Genetics       Recommend seed localized excisional biopsy of the atypical ductal hyperplasia.  I am also referring the patient to genetic counseling.  She does have an extensive family history of cancer although the patient herself has not had a diagnosis of cancer before.  She is unaware of any of her family members having received genetic counseling or testing.   The surgical procedure was described to the patient.  I discussed the incision type and location and that we would need radiology involved pre op to place a seed 1-2 days pre op.      We discussed the risks bleeding, infection, damage to other structures, need for further procedures/surgeries.  We discussed the risk of seroma.  The patient was advised if the breast has cancer, we may need to go back to surgery for additional tissue to obtain negative margins or for a lymph node biopsy. The patient was advised that these are the most common complications, but that others can occur as well.   There are rare instances of heart/lung issues post op as well as blood clots.      They were advised against taking aspirin or other anti-inflammatory agents/blood thinners the week before surgery.     The risks and benefits of the procedure were described to the patient and she wishes to proceed.

## 2023-08-07 ENCOUNTER — Ambulatory Visit
Admission: RE | Admit: 2023-08-07 | Discharge: 2023-08-07 | Disposition: A | Discharge status: Home / Self Care | Payer: Medicare PPO | Source: Ambulatory Visit | Attending: General Surgery | Admitting: General Surgery

## 2023-08-07 DIAGNOSIS — N6092 Unspecified benign mammary dysplasia of left breast: Secondary | ICD-10-CM | POA: Diagnosis not present

## 2023-08-07 HISTORY — PX: BREAST BIOPSY: SHX20

## 2023-08-08 ENCOUNTER — Ambulatory Visit: Payer: Medicare PPO | Admitting: Family Medicine

## 2023-08-08 ENCOUNTER — Ambulatory Visit (HOSPITAL_BASED_OUTPATIENT_CLINIC_OR_DEPARTMENT_OTHER): Payer: Medicare PPO | Admitting: Anesthesiology

## 2023-08-08 ENCOUNTER — Other Ambulatory Visit: Payer: Self-pay

## 2023-08-08 ENCOUNTER — Ambulatory Visit
Admission: RE | Admit: 2023-08-08 | Discharge: 2023-08-08 | Disposition: A | Discharge status: Home / Self Care | Payer: Medicare PPO | Source: Ambulatory Visit | Attending: General Surgery | Admitting: General Surgery

## 2023-08-08 ENCOUNTER — Encounter (HOSPITAL_BASED_OUTPATIENT_CLINIC_OR_DEPARTMENT_OTHER)
Admission: RE | Disposition: A | Discharge status: Home / Self Care | Payer: Self-pay | Source: Home / Self Care | Attending: General Surgery

## 2023-08-08 ENCOUNTER — Ambulatory Visit (HOSPITAL_BASED_OUTPATIENT_CLINIC_OR_DEPARTMENT_OTHER)
Admission: RE | Admit: 2023-08-08 | Discharge: 2023-08-08 | Disposition: A | Discharge status: Home / Self Care | Payer: Medicare PPO | Attending: General Surgery | Admitting: General Surgery

## 2023-08-08 ENCOUNTER — Encounter (HOSPITAL_BASED_OUTPATIENT_CLINIC_OR_DEPARTMENT_OTHER): Payer: Self-pay | Admitting: General Surgery

## 2023-08-08 DIAGNOSIS — Z8249 Family history of ischemic heart disease and other diseases of the circulatory system: Secondary | ICD-10-CM | POA: Diagnosis not present

## 2023-08-08 DIAGNOSIS — Z800 Family history of malignant neoplasm of digestive organs: Secondary | ICD-10-CM | POA: Diagnosis not present

## 2023-08-08 DIAGNOSIS — R928 Other abnormal and inconclusive findings on diagnostic imaging of breast: Secondary | ICD-10-CM

## 2023-08-08 DIAGNOSIS — N6012 Diffuse cystic mastopathy of left breast: Secondary | ICD-10-CM | POA: Insufficient documentation

## 2023-08-08 DIAGNOSIS — Z803 Family history of malignant neoplasm of breast: Secondary | ICD-10-CM | POA: Insufficient documentation

## 2023-08-08 DIAGNOSIS — N6092 Unspecified benign mammary dysplasia of left breast: Secondary | ICD-10-CM

## 2023-08-08 DIAGNOSIS — E119 Type 2 diabetes mellitus without complications: Secondary | ICD-10-CM | POA: Insufficient documentation

## 2023-08-08 DIAGNOSIS — I10 Essential (primary) hypertension: Secondary | ICD-10-CM

## 2023-08-08 DIAGNOSIS — M1990 Unspecified osteoarthritis, unspecified site: Secondary | ICD-10-CM | POA: Diagnosis not present

## 2023-08-08 DIAGNOSIS — E785 Hyperlipidemia, unspecified: Secondary | ICD-10-CM | POA: Diagnosis not present

## 2023-08-08 DIAGNOSIS — Z79899 Other long term (current) drug therapy: Secondary | ICD-10-CM | POA: Diagnosis not present

## 2023-08-08 DIAGNOSIS — Z7985 Long-term (current) use of injectable non-insulin antidiabetic drugs: Secondary | ICD-10-CM | POA: Insufficient documentation

## 2023-08-08 DIAGNOSIS — R921 Mammographic calcification found on diagnostic imaging of breast: Secondary | ICD-10-CM | POA: Diagnosis not present

## 2023-08-08 DIAGNOSIS — Z01818 Encounter for other preprocedural examination: Secondary | ICD-10-CM

## 2023-08-08 HISTORY — PX: RADIOACTIVE SEED GUIDED EXCISIONAL BREAST BIOPSY: SHX6490

## 2023-08-08 LAB — GLUCOSE, CAPILLARY
Glucose-Capillary: 119 mg/dL — ABNORMAL HIGH (ref 70–99)
Glucose-Capillary: 102 mg/dL — ABNORMAL HIGH (ref 70–99)

## 2023-08-08 SURGERY — RADIOACTIVE SEED GUIDED BREAST BIOPSY
Anesthesia: General | Site: Breast | Laterality: Left

## 2023-08-08 MED ORDER — PHENYLEPHRINE 80 MCG/ML (10ML) SYRINGE FOR IV PUSH (FOR BLOOD PRESSURE SUPPORT)
PREFILLED_SYRINGE | INTRAVENOUS | Status: AC
  Filled 2023-08-08: qty 40

## 2023-08-08 MED ORDER — DEXAMETHASONE SODIUM PHOSPHATE 4 MG/ML IJ SOLN
INTRAMUSCULAR | Status: DC | PRN
  Administered 2023-08-08: 5 mg via INTRAVENOUS

## 2023-08-08 MED ORDER — PROPOFOL 500 MG/50ML IV EMUL
INTRAVENOUS | Status: AC
  Filled 2023-08-08: qty 50

## 2023-08-08 MED ORDER — LIDOCAINE HCL (CARDIAC) PF 100 MG/5ML IV SOSY
PREFILLED_SYRINGE | INTRAVENOUS | Status: DC | PRN
Start: 2023-08-08 — End: 2023-08-08
  Administered 2023-08-08: 60 mg via INTRAVENOUS

## 2023-08-08 MED ORDER — EPHEDRINE SULFATE (PRESSORS) 50 MG/ML IJ SOLN
INTRAMUSCULAR | Status: DC | PRN
  Administered 2023-08-08 (×5): 5 mg via INTRAVENOUS

## 2023-08-08 MED ORDER — CEFAZOLIN SODIUM-DEXTROSE 2-4 GM/100ML-% IV SOLN
INTRAVENOUS | Status: AC
  Filled 2023-08-08: qty 100

## 2023-08-08 MED ORDER — ONDANSETRON HCL 4 MG/2ML IJ SOLN
INTRAMUSCULAR | Status: DC | PRN
  Administered 2023-08-08: 4 mg via INTRAVENOUS

## 2023-08-08 MED ORDER — KETOROLAC TROMETHAMINE 15 MG/ML IJ SOLN: 15.0000 mg | Freq: Once | INTRAMUSCULAR | Status: DC | PRN

## 2023-08-08 MED ORDER — FENTANYL CITRATE (PF) 100 MCG/2ML IJ SOLN: 25.0000 ug | INTRAMUSCULAR | Status: DC | PRN

## 2023-08-08 MED ORDER — LIDOCAINE 2% (20 MG/ML) 5 ML SYRINGE
INTRAMUSCULAR | Status: AC
  Filled 2023-08-08: qty 10

## 2023-08-08 MED ORDER — HYDROCODONE-ACETAMINOPHEN 5-325 MG PO TABS: 1.0000 | ORAL_TABLET | Freq: Four times a day (QID) | ORAL | 0 refills | Status: AC | PRN

## 2023-08-08 MED ORDER — 0.9 % SODIUM CHLORIDE (POUR BTL) OPTIME
TOPICAL | Status: DC | PRN
  Administered 2023-08-08: 700 mL

## 2023-08-08 MED ORDER — CEFAZOLIN SODIUM-DEXTROSE 2-4 GM/100ML-% IV SOLN
2.0000 g | INTRAVENOUS | Status: AC
  Administered 2023-08-08: 2 g via INTRAVENOUS

## 2023-08-08 MED ORDER — ACETAMINOPHEN 500 MG PO TABS
1000.0000 mg | ORAL_TABLET | ORAL | Status: AC
  Administered 2023-08-08: 1000 mg via ORAL

## 2023-08-08 MED ORDER — LACTATED RINGERS IV SOLN: INTRAVENOUS | Status: DC

## 2023-08-08 MED ORDER — EPHEDRINE 5 MG/ML INJ
INTRAVENOUS | Status: AC
  Filled 2023-08-08: qty 10

## 2023-08-08 MED ORDER — PHENYLEPHRINE HCL (PRESSORS) 10 MG/ML IV SOLN
INTRAVENOUS | Status: DC | PRN
  Administered 2023-08-08 (×5): 80 ug via INTRAVENOUS

## 2023-08-08 MED ORDER — FENTANYL CITRATE (PF) 100 MCG/2ML IJ SOLN
INTRAMUSCULAR | Status: AC
  Filled 2023-08-08: qty 2

## 2023-08-08 MED ORDER — PROPOFOL 10 MG/ML IV BOLUS
INTRAVENOUS | Status: DC | PRN
  Administered 2023-08-08: 200 mg via INTRAVENOUS

## 2023-08-08 MED ORDER — AMISULPRIDE (ANTIEMETIC) 5 MG/2ML IV SOLN: 10.0000 mg | Freq: Once | INTRAVENOUS | Status: DC | PRN

## 2023-08-08 MED ORDER — ACETAMINOPHEN 500 MG PO TABS
ORAL_TABLET | ORAL | Status: AC
  Filled 2023-08-08: qty 2

## 2023-08-08 MED ORDER — DEXMEDETOMIDINE HCL IN NACL 80 MCG/20ML IV SOLN
INTRAVENOUS | Status: DC | PRN
  Administered 2023-08-08: 4 ug via INTRAVENOUS

## 2023-08-08 MED ORDER — ONDANSETRON HCL 4 MG/2ML IJ SOLN: 4.0000 mg | Freq: Once | INTRAMUSCULAR | Status: DC | PRN

## 2023-08-08 MED ORDER — LIDOCAINE HCL (PF) 1 % IJ SOLN
INTRAMUSCULAR | Status: DC | PRN
  Administered 2023-08-08: 60 mL

## 2023-08-08 MED ORDER — FENTANYL CITRATE (PF) 100 MCG/2ML IJ SOLN
INTRAMUSCULAR | Status: DC | PRN
  Administered 2023-08-08 (×2): 25 ug via INTRAVENOUS

## 2023-08-08 SURGICAL SUPPLY — 45 items
BINDER BREAST XXLRG (GAUZE/BANDAGES/DRESSINGS) IMPLANT
BLADE SURG 10 STRL SS (BLADE) ×1 IMPLANT
BLADE SURG 15 STRL LF DISP TIS (BLADE) IMPLANT
CANISTER SUC SOCK COL 7IN (MISCELLANEOUS) IMPLANT
CANISTER SUCT 1200ML W/VALVE (MISCELLANEOUS) ×1 IMPLANT
CHLORAPREP W/TINT 26 (MISCELLANEOUS) ×1 IMPLANT
CLIP TI LARGE 6 (CLIP) ×1 IMPLANT
COVER BACK TABLE 60X90IN (DRAPES) ×1 IMPLANT
COVER MAYO STAND STRL (DRAPES) ×1 IMPLANT
COVER PROBE CYLINDRICAL 5X96 (MISCELLANEOUS) ×1 IMPLANT
DERMABOND ADVANCED .7 DNX12 (GAUZE/BANDAGES/DRESSINGS) ×1 IMPLANT
DRAPE LAPAROSCOPIC ABDOMINAL (DRAPES) ×1 IMPLANT
DRAPE UTILITY XL STRL (DRAPES) ×1 IMPLANT
ELECT COATED BLADE 2.86 ST (ELECTRODE) ×1 IMPLANT
ELECT REM PT RETURN 9FT ADLT (ELECTROSURGICAL) ×1 IMPLANT
ELECTRODE REM PT RTRN 9FT ADLT (ELECTROSURGICAL) ×1 IMPLANT
GAUZE SPONGE 4X4 12PLY STRL LF (GAUZE/BANDAGES/DRESSINGS) ×1 IMPLANT
GLOVE BIO SURGEON STRL SZ 6 (GLOVE) ×1 IMPLANT
GLOVE BIOGEL PI IND STRL 6.5 (GLOVE) ×1 IMPLANT
GOWN STRL REUS W/ TWL LRG LVL3 (GOWN DISPOSABLE) ×1 IMPLANT
GOWN STRL REUS W/ TWL XL LVL3 (GOWN DISPOSABLE) ×1 IMPLANT
KIT MARKER MARGIN INK (KITS) ×1 IMPLANT
LIGHT WAVEGUIDE WIDE FLAT (MISCELLANEOUS) IMPLANT
NDL HYPO 25X1 1.5 SAFETY (NEEDLE) ×1 IMPLANT
NEEDLE HYPO 25X1 1.5 SAFETY (NEEDLE) ×1 IMPLANT
NS IRRIG 1000ML POUR BTL (IV SOLUTION) ×1 IMPLANT
PACK BASIN DAY SURGERY FS (CUSTOM PROCEDURE TRAY) ×1 IMPLANT
PENCIL SMOKE EVACUATOR (MISCELLANEOUS) ×1 IMPLANT
SLEEVE SCD COMPRESS KNEE MED (STOCKING) ×1 IMPLANT
SPIKE FLUID TRANSFER (MISCELLANEOUS) IMPLANT
SPONGE T-LAP 18X18 ~~LOC~~+RFID (SPONGE) ×1 IMPLANT
STAPLER SKIN PROX WIDE 3.9 (STAPLE) IMPLANT
STRIP CLOSURE SKIN 1/2X4 (GAUZE/BANDAGES/DRESSINGS) ×1 IMPLANT
SUT MON AB 4-0 PC3 18 (SUTURE) ×1 IMPLANT
SUT SILK 2 0 SH (SUTURE) IMPLANT
SUT VIC AB 2-0 SH 18 (SUTURE) IMPLANT
SUT VIC AB 3-0 SH 27X BRD (SUTURE) ×1 IMPLANT
SUT VICRYL 3-0 CR8 SH (SUTURE) IMPLANT
SYR 10ML LL (SYRINGE) IMPLANT
SYR BULB EAR ULCER 3OZ GRN STR (SYRINGE) ×1 IMPLANT
SYR CONTROL 10ML LL (SYRINGE) ×1 IMPLANT
TOWEL GREEN STERILE FF (TOWEL DISPOSABLE) ×1 IMPLANT
TRAY FAXITRON CT DISP (TRAY / TRAY PROCEDURE) ×1 IMPLANT
TUBE CONNECTING 20X1/4 (TUBING) ×1 IMPLANT
YANKAUER SUCT BULB TIP NO VENT (SUCTIONS) ×1 IMPLANT

## 2023-08-08 NOTE — Transfer of Care (Signed)
 Immediate Anesthesia Transfer of Care Note  Patient: Renee [name]  Procedure(s) Performed: LEFT BREAST SEED LOCALIZED EXCISIONAL BIOPSY (Left: Breast)  Patient Location: PACU  Anesthesia Type:General  Level of Consciousness: awake, alert , oriented, and patient cooperative  Airway & Oxygen Therapy: Patient Spontanous Breathing and Patient connected to nasal cannula oxygen  Post-op Assessment: Report given to RN and Post -op Vital signs reviewed and stable  Post vital signs: Reviewed and stable  Last Vitals:  Vitals Value Taken Time  BP 110/63 08/08/23 1016  Temp    Pulse 82 08/08/23 1017  Resp 14 08/08/23 1017  SpO2 98 % 08/08/23 1017  Vitals shown include unfiled device data.  Last Pain:  Vitals:   08/08/23 0743  TempSrc:   PainSc: 0-No pain      Patients Stated Pain Goal: 2 (08/08/23 0740)  Complications: No notable events documented.

## 2023-08-08 NOTE — Anesthesia Postprocedure Evaluation (Signed)
 Anesthesia Post Note  Patient: Renee [name]  Procedure(s) Performed: LEFT BREAST SEED LOCALIZED EXCISIONAL BIOPSY (Left: Breast)     Patient location during evaluation: PACU Anesthesia Type: General Level of consciousness: awake Pain management: pain level controlled Vital Signs Assessment: post-procedure vital signs reviewed and stable Respiratory status: spontaneous breathing, nonlabored ventilation and respiratory function stable Cardiovascular status: blood pressure returned to baseline and stable Postop Assessment: no apparent nausea or vomiting Anesthetic complications: no   No notable events documented.  Last Vitals:  Vitals:   08/08/23 1045 08/08/23 1132  BP: (!) 122/59 (!) 126/57  Pulse: 74 88  Resp: 15 16  Temp:  (!) 36.2 C  SpO2: 94% 93%    Last Pain:  Vitals:   08/08/23 1132  TempSrc: Temporal  PainSc:                  Catheryn Bacon Ariabella Brien

## 2023-08-08 NOTE — Op Note (Signed)
 Left Breast Radioactive seed localized excisional biopsy  Indications: This patient presents with history of abnormal left mammogram with atypical ductal hyperplasia on core needle biopsy  Pre-operative Diagnosis: abnormal left mammogram    Post-operative Diagnosis: abnormal left mammogram  Surgeon: Almond Lint   Anesthesia: General endotracheal anesthesia  ASA Class: 3  Procedure Details  The patient was seen in the Holding Room. The risks, benefits, complications, treatment options, and expected outcomes were discussed with the patient. The possibilities of bleeding, infection, the need for additional procedures, failure to diagnose a condition, and creating a complication requiring transfusion or operation were discussed with the patient. The patient concurred with the proposed plan, giving informed consent.  The site of surgery properly noted/marked. The patient was taken to Operating Room # 8, identified, and the procedure verified as left Breast seed localized excisional biopsy. A Time Out was held and the above information confirmed.  The left breast and chest were prepped and draped in standard fashion. A transverse lateral incision was made near the previously placed radioactive seed.  Dissection was carried down around the point of maximum signal intensity. The cautery was used to perform the dissection.   The specimen was inked with the margin marker paint kit.    Specimen radiography confirmed inclusion of the mammographic lesion, the clip, and the seed.  The background signal in the breast was zero.  Hemostasis was achieved with cautery.  Local anesthetic was infiltrated into the surrounding breast tissue.  The wound was irrigated and closed with 3-0 vicryl interrupted deep dermal sutures and 4-0 monocryl running subcuticular suture.      Sterile dressings were applied. At the end of the operation, all sponge, instrument, and needle counts were correct.  Findings: Seed, clip in  specimen.  Estimated Blood Loss:  min         Specimens: left breast tissue with seed         Complications:  None; patient tolerated the procedure well.         Disposition: PACU - hemodynamically stable.         Condition: stable

## 2023-08-08 NOTE — Interval H&P Note (Signed)
 History and Physical Interval Note:  08/08/2023 8:49 AM  Renee [name]  has presented today for surgery, with the diagnosis of LEFT BREAST ATYPICAL DUCTAL HYPERPLASIA.  The various methods of treatment have been discussed with the patient and family. After consideration of risks, benefits and other options for treatment, the patient has consented to  Procedure(s): LEFT BREAST SEED LOCALIZED EXCISIONAL BIOPSY (Left) as a surgical intervention.  The patient's history has been reviewed, patient examined, no change in status, stable for surgery.  I have reviewed the patient's chart and labs.  Questions were answered to the patient's satisfaction.     Almond Lint

## 2023-08-08 NOTE — Discharge Instructions (Addendum)
 [facility] Office Phone Number [phone]  BREAST BIOPSY/ PARTIAL MASTECTOMY: POST OP INSTRUCTIONS  Always review your discharge instruction sheet given to you by the facility where your surgery was performed.  IF YOU HAVE DISABILITY OR FAMILY LEAVE FORMS, YOU MUST BRING THEM TO THE OFFICE FOR PROCESSING.  DO NOT GIVE THEM TO YOUR DOCTOR.  Take 2 tylenol (acetominophen) three times a day for 3 days.  If you still have pain, add ibuprofen with food in between if able to take this (if you have kidney issues or stomach issues, do not take ibuprofen).  If both of those are not enough, add the narcotic pain pill.  If you find you are needing a lot of this overnight after surgery, call the next morning for a refill.    Prescriptions will not be filled after 5pm or on week-ends. Take your usually prescribed medications unless otherwise directed You should eat very light the first 24 hours after surgery, such as soup, crackers, pudding, etc.  Resume your normal diet the day after surgery. Most patients will experience some swelling and bruising in the breast.  Ice packs and a good support bra will help.  Swelling and bruising can take several days to resolve.  It is common to experience some constipation if taking pain medication after surgery.  Increasing fluid intake and taking a stool softener will usually help or prevent this problem from occurring.  A mild laxative (Milk of Magnesia or Miralax) should be taken according to package directions if there are no bowel movements after 48 hours. Unless discharge instructions indicate otherwise, you may remove your bandages 48 hours after surgery, and you may shower at that time.  You may have steri-strips (small skin tapes) in place directly over the incision.  These strips should be left on the skin at least for for 7-10 days.    ACTIVITIES:  You may resume regular daily activities (gradually increasing) beginning the next day.  Wearing a  good support bra or sports bra (or the breast binder) minimizes pain and swelling.  You may have sexual intercourse when it is comfortable. No heavy lifting for 1-2 weeks (not over around 10 pounds).  You may drive when you no longer are taking prescription pain medication, you can comfortably wear a seatbelt, and you can safely maneuver your car and apply brakes. RETURN TO WORK:  __________3-14 days depending on job. _______________ Bonita Quin should see your doctor in the office for a follow-up appointment approximately two weeks after your surgery.  Your doctor's nurse will typically make your follow-up appointment when she calls you with your pathology report.  Expect your pathology report 3-4 business days after your surgery.  You may call to check if you do not hear from [place] after three days.   WHEN TO CALL YOUR DOCTOR: Fever over 101.0 Nausea and/or vomiting. Extreme swelling or bruising. Continued bleeding from incision. Increased pain, redness, or drainage from the incision.  The clinic staff is available to answer your questions during regular business hours.  Please don't hesitate to call and ask to speak to one of the nurses for clinical concerns.  If you have a medical emergency, go to the nearest emergency room or call 911.  A surgeon from [facility] is always on call at the hospital.  For further questions, please visit [email or web address]   Post Anesthesia Home Care Instructions  Activity: Get plenty of rest for the remainder of the day. A responsible individual must stay with  you for 24 hours following the procedure.  For the next 24 hours, DO NOT: -Drive a car -Advertising copywriter -Drink alcoholic beverages -Take any medication unless instructed by your physician -Make any legal decisions or sign important papers.  Meals: Start with liquid foods such as gelatin or soup. Progress to regular foods as tolerated. Avoid greasy, spicy, heavy foods. If nausea  and/or vomiting occur, drink only clear liquids until the nausea and/or vomiting subsides. Call your physician if vomiting continues.  Special Instructions/Symptoms: Your throat may feel dry or sore from the anesthesia or the breathing tube placed in your throat during surgery. If this causes discomfort, gargle with warm salt water. The discomfort should disappear within 24 hours.  May have Tylenol after 3:45pm if needed.

## 2023-08-08 NOTE — Anesthesia Preprocedure Evaluation (Addendum)
 Anesthesia Evaluation  Patient identified by MRN, date of birth, ID band Patient awake    Reviewed: Allergy & Precautions, NPO status , Patient's Chart, lab work & pertinent test results  Airway Mallampati: III  TM Distance: >3 FB Neck ROM: Full    Dental no notable dental hx.    Pulmonary neg pulmonary ROS   Pulmonary exam normal        Cardiovascular hypertension, Pt. on medications Normal cardiovascular exam     Neuro/Psych  Headaches  negative psych ROS   GI/Hepatic negative GI ROS, Neg liver ROS,,,  Endo/Other  diabetes  Patient on GLP-1 Agonist  Renal/GU negative Renal ROS     Musculoskeletal  (+) Arthritis ,    Abdominal   Peds  Hematology negative hematology ROS (+)   Anesthesia Other Findings LEFT BREAST ATYPICAL DUCTAL HYPERPLASIA  Reproductive/Obstetrics                             Anesthesia Physical Anesthesia Plan  ASA: 3  Anesthesia Plan: General   Post-op Pain Management:    Induction: Intravenous  PONV Risk Score and Plan: 3 and Ondansetron, Dexamethasone, Midazolam and Treatment may vary due to age or medical condition  Airway Management Planned: LMA  Additional Equipment:   Intra-op Plan:   Post-operative Plan: Extubation in OR  Informed Consent: I have reviewed the patients History and Physical, chart, labs and discussed the procedure including the risks, benefits and alternatives for the proposed anesthesia with the patient or authorized representative who has indicated his/her understanding and acceptance.     Dental advisory given  Plan Discussed with: CRNA  Anesthesia Plan Comments:        Anesthesia Quick Evaluation

## 2023-08-08 NOTE — Anesthesia Procedure Notes (Signed)
 Procedure Name: LMA Insertion Date/Time: 08/08/2023 9:10 AM  Performed by: Earmon Phoenix, CRNAPre-anesthesia Checklist: Patient identified, Emergency Drugs available, Suction available, Patient being monitored and Timeout performed Patient Re-evaluated:Patient Re-evaluated prior to induction Oxygen Delivery Method: Circle system utilized Preoxygenation: Pre-oxygenation with 100% oxygen Induction Type: IV induction Ventilation: Mask ventilation without difficulty LMA: LMA inserted LMA Size: 4.0 Tube size: 4.0 mm Number of attempts: 1 Placement Confirmation: positive ETCO2 and breath sounds checked- equal and bilateral Tube secured with: Tape Dental Injury: Teeth and Oropharynx as per pre-operative assessment

## 2023-08-09 ENCOUNTER — Encounter (HOSPITAL_BASED_OUTPATIENT_CLINIC_OR_DEPARTMENT_OTHER): Payer: Self-pay | Admitting: General Surgery

## 2023-08-10 ENCOUNTER — Telehealth: Payer: Self-pay | Admitting: Pharmacy Technician

## 2023-08-10 ENCOUNTER — Other Ambulatory Visit: Payer: Self-pay | Admitting: Internal Medicine

## 2023-08-10 ENCOUNTER — Other Ambulatory Visit: Payer: Self-pay

## 2023-08-10 ENCOUNTER — Other Ambulatory Visit (HOSPITAL_COMMUNITY): Payer: Self-pay

## 2023-08-10 LAB — SURGICAL PATHOLOGY

## 2023-08-10 MED ORDER — NEXLETOL 180 MG PO TABS: 1.0000 | ORAL_TABLET | Freq: Every day | ORAL | 11 refills | Status: AC

## 2023-08-10 NOTE — Telephone Encounter (Signed)
 Pharmacy Patient [facility] Encounter  Received notification from [facility] that Prior Authorization for NEXLETOL has been APPROVED from 03/13/23 to 06/11/24 I spoke to the pharmacy and they said they need a refill. Sent message to staff

## 2023-08-17 DIAGNOSIS — I10 Essential (primary) hypertension: Secondary | ICD-10-CM | POA: Diagnosis not present

## 2023-08-17 DIAGNOSIS — M2550 Pain in unspecified joint: Secondary | ICD-10-CM | POA: Diagnosis not present

## 2023-08-17 DIAGNOSIS — E785 Hyperlipidemia, unspecified: Secondary | ICD-10-CM | POA: Diagnosis not present

## 2023-08-17 DIAGNOSIS — E119 Type 2 diabetes mellitus without complications: Secondary | ICD-10-CM | POA: Diagnosis not present

## 2023-08-17 DIAGNOSIS — F418 Other specified anxiety disorders: Secondary | ICD-10-CM | POA: Diagnosis not present

## 2023-08-22 ENCOUNTER — Other Ambulatory Visit: Payer: Self-pay | Admitting: Internal Medicine

## 2023-08-22 DIAGNOSIS — E7849 Other hyperlipidemia: Secondary | ICD-10-CM

## 2023-08-22 DIAGNOSIS — I2510 Atherosclerotic heart disease of native coronary artery without angina pectoris: Secondary | ICD-10-CM

## 2023-08-22 DIAGNOSIS — E7801 Familial hypercholesterolemia: Secondary | ICD-10-CM

## 2023-08-31 DIAGNOSIS — H35363 Drusen (degenerative) of macula, bilateral: Secondary | ICD-10-CM | POA: Diagnosis not present

## 2023-08-31 DIAGNOSIS — H25813 Combined forms of age-related cataract, bilateral: Secondary | ICD-10-CM | POA: Diagnosis not present

## 2023-08-31 DIAGNOSIS — E119 Type 2 diabetes mellitus without complications: Secondary | ICD-10-CM | POA: Diagnosis not present

## 2023-08-31 DIAGNOSIS — H524 Presbyopia: Secondary | ICD-10-CM | POA: Diagnosis not present

## 2023-08-31 DIAGNOSIS — H43813 Vitreous degeneration, bilateral: Secondary | ICD-10-CM | POA: Diagnosis not present

## 2023-08-31 DIAGNOSIS — H5203 Hypermetropia, bilateral: Secondary | ICD-10-CM | POA: Diagnosis not present

## 2023-08-31 DIAGNOSIS — H409 Unspecified glaucoma: Secondary | ICD-10-CM | POA: Diagnosis not present

## 2023-08-31 DIAGNOSIS — H04123 Dry eye syndrome of bilateral lacrimal glands: Secondary | ICD-10-CM | POA: Diagnosis not present

## 2023-08-31 DIAGNOSIS — H52223 Regular astigmatism, bilateral: Secondary | ICD-10-CM | POA: Diagnosis not present

## 2023-09-17 ENCOUNTER — Encounter: Payer: Self-pay | Admitting: Family Medicine

## 2023-09-17 ENCOUNTER — Ambulatory Visit: Admitting: Family Medicine

## 2023-09-17 VITALS — BP 112/74 | HR 78 | Ht 64.0 in | Wt 164.0 lb

## 2023-09-17 DIAGNOSIS — M545 Low back pain, unspecified: Secondary | ICD-10-CM | POA: Diagnosis not present

## 2023-09-17 DIAGNOSIS — M9904 Segmental and somatic dysfunction of sacral region: Secondary | ICD-10-CM

## 2023-09-17 DIAGNOSIS — M9902 Segmental and somatic dysfunction of thoracic region: Secondary | ICD-10-CM | POA: Diagnosis not present

## 2023-09-17 DIAGNOSIS — M9901 Segmental and somatic dysfunction of cervical region: Secondary | ICD-10-CM

## 2023-09-17 DIAGNOSIS — G8929 Other chronic pain: Secondary | ICD-10-CM | POA: Diagnosis not present

## 2023-09-17 DIAGNOSIS — M9903 Segmental and somatic dysfunction of lumbar region: Secondary | ICD-10-CM

## 2023-09-17 DIAGNOSIS — M9908 Segmental and somatic dysfunction of rib cage: Secondary | ICD-10-CM

## 2023-09-17 NOTE — Patient Instructions (Signed)
 See me in 6-8 weeks

## 2023-09-17 NOTE — Assessment & Plan Note (Addendum)
 Patient does have some loss lordosis noted.  Some tenderness to palpation in the paraspinal musculature.  Has been very active recently and been more out of her routine secondary to her ailing mother.  Will also be taking care of her grandchild for a week and will be traveling thereafter.  Worsening pain will need some possible prednisone.  Follow-up again in 6 to 8 weeks

## 2023-09-17 NOTE — Progress Notes (Signed)
 [name] [address] [place] [address] Phone: [phone] Subjective:   Renee [name], am serving as a scribe for Dr. Antoine Primas.  I'm seeing this patient by the request  of:  [name], MD  CC: Back and neck pain follow-up  [id number]  Renee [name] is a 73 y.o. female coming in with complaint of back and neck pain. OMT 08/06/2023. Patient states that she has been staying in a bed other than her own since January as she has been taking care of her mom.     Medications patient has been prescribed: None  Taking:         Reviewed prior external information including notes and imaging from previsou exam, outside providers and external EMR if available.   As well as notes that were available from care everywhere and other healthcare systems.  Past medical history, social, surgical and family history all reviewed in electronic medical record.  No pertanent information unless stated regarding to the chief complaint.   Past Medical History:  Diagnosis Date   Diabetes mellitus without complication (HCC)    HSV infection    Hyperlipidemia    Hypertension     Allergies  Allergen Reactions   Codeine Nausea Only     Review of Systems:  No headache, visual changes, nausea, vomiting, diarrhea, constipation, dizziness, abdominal pain, skin rash, fevers, chills, night sweats, weight loss, swollen lymph nodes, body aches, joint swelling, chest pain, shortness of breath, mood changes. POSITIVE muscle aches  Objective  Blood pressure 112/74, pulse 78, height 5\' 4"  (1.626 m), weight 164 lb (74.4 kg), SpO2 99%.   General: No apparent distress alert and oriented x3 mood and affect normal, dressed appropriately.  HEENT: Pupils equal, extraocular movements intact  Respiratory: Patient's speak in full sentences and does not appear short of breath  Cardiovascular: No lower extremity edema, non tender, no erythema  Gait MSK:   Back significant tightness noted in the paraspinal musculature.  Seems to be more diffuse in the back than anywhere else.  Osteopathic findings  C3 flexed rotated and side bent right C7 flexed rotated and side bent left T3 extended rotated and side bent right inhaled rib T9 extended rotated and side bent left L2 flexed rotated and side bent right L4 flexed rotated and side left Sacrum right on right       Assessment and Plan:  Low back pain Patient does have some loss lordosis noted.  Some tenderness to palpation in the paraspinal musculature.  Has been very active recently and been more out of her routine secondary to her ailing mother.  Will also be taking care of her grandchild for a week and will be traveling thereafter.  Worsening pain will need some possible prednisone.  Follow-up again in 6 to 8 weeks    Nonallopathic problems  Decision today to treat with OMT was based on Physical Exam  After verbal consent patient was treated with HVLA, ME, FPR techniques in cervical, rib, thoracic, lumbar, and sacral  areas  Patient tolerated the procedure well with improvement in symptoms  Patient given exercises, stretches and lifestyle modifications  See medications in patient instructions if given  Patient will follow up in 4-8 weeks     The above documentation has been reviewed and is accurate and complete Judi Saa, DO         Note: This dictation was prepared with Dragon dictation along with smaller phrase technology. Any transcriptional errors  that result from this process are unintentional.

## 2023-10-03 ENCOUNTER — Ambulatory Visit: Payer: Medicare PPO | Admitting: Family Medicine

## 2023-10-08 DIAGNOSIS — N644 Mastodynia: Secondary | ICD-10-CM | POA: Insufficient documentation

## 2023-10-08 DIAGNOSIS — N6489 Other specified disorders of breast: Secondary | ICD-10-CM | POA: Insufficient documentation

## 2023-10-09 IMAGING — DX DG PELVIS 1-2V
1 series · 1 of 1 positions shown · non-contrast
Comparison: None.

CLINICAL DATA: Chronic bilateral hip pain

EXAM:
PELVIS - 1-2 VIEW

[pelvis ap]
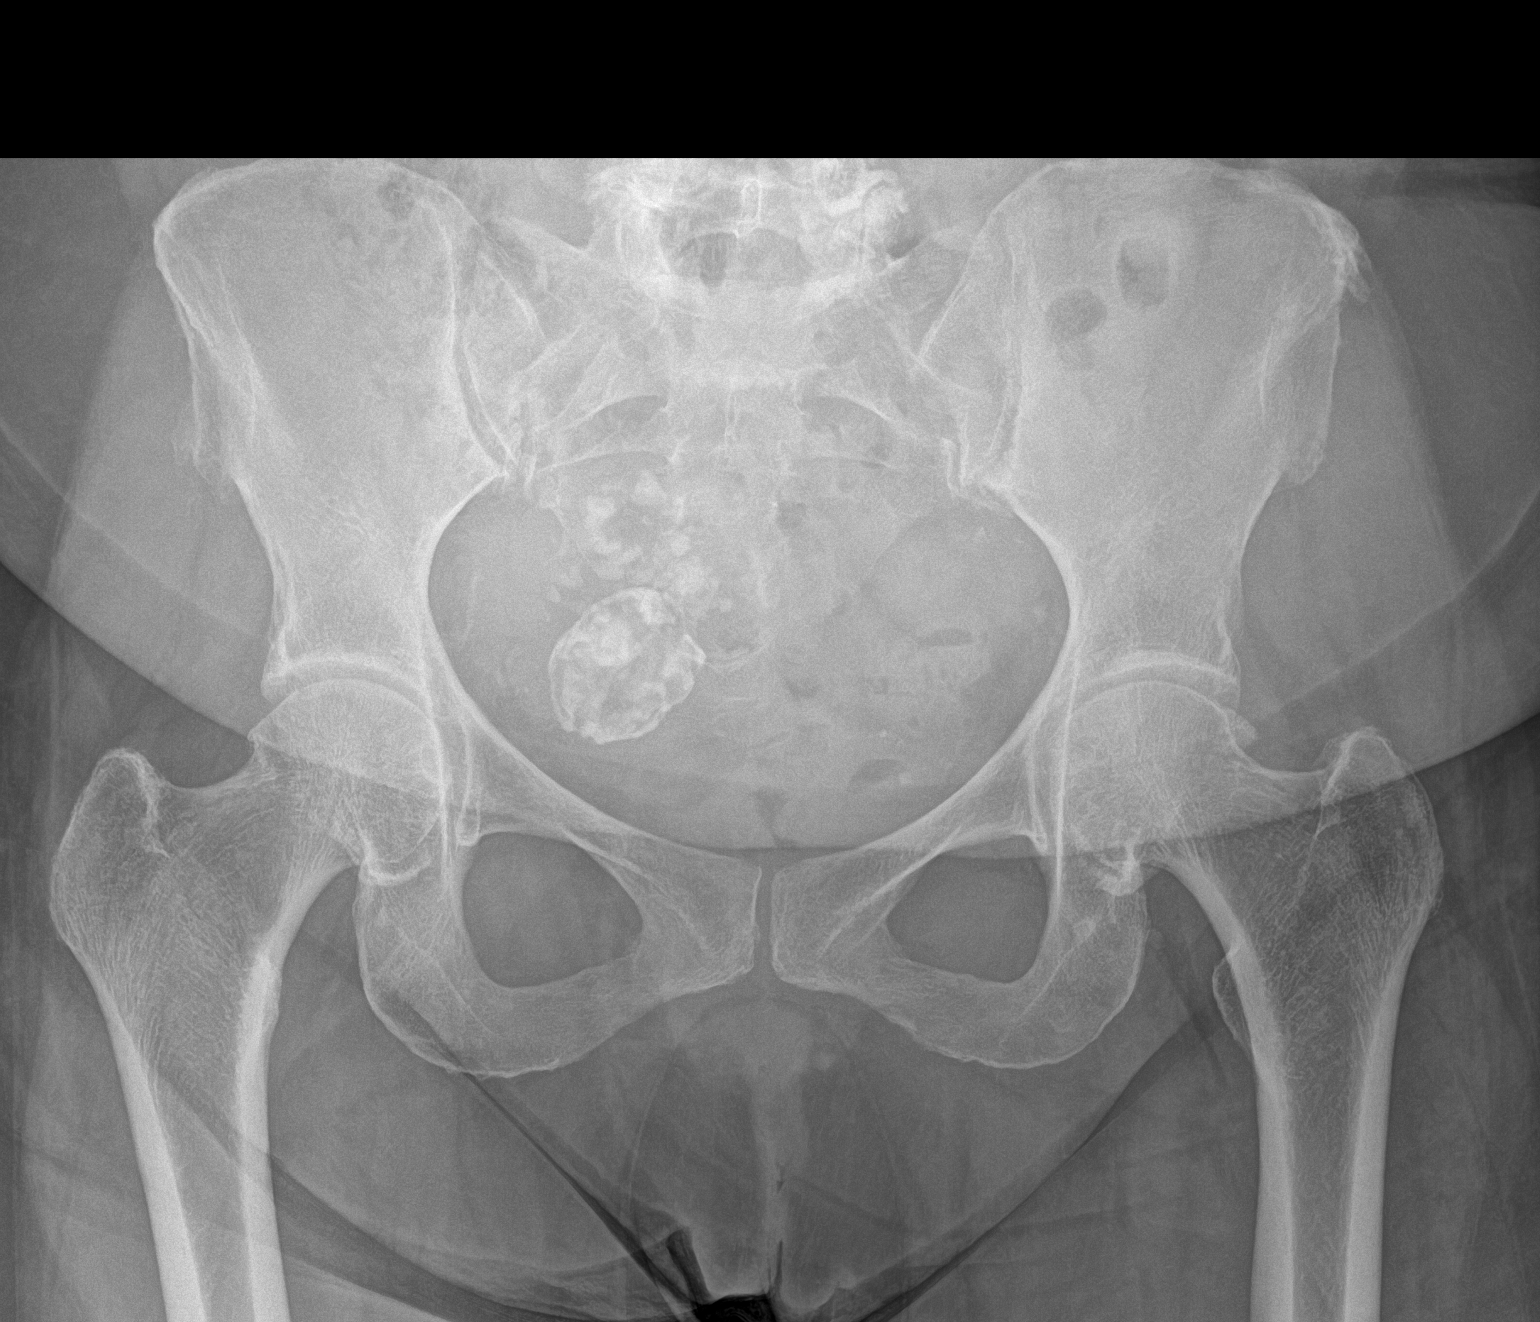

[1 of 1 positions shown; findings below may reference images not displayed]

FINDINGS: Moderate degenerative changes of the lumbosacral spine including
facet arthropathy at L4-5 and L5-S1. Normal appearing SI joints for
age. Bony pelvis and hips appear symmetric and intact. Minor
degenerative changes of both hips without significant arthropathy.
Right pelvic calcifications consistent with calcified degenerated
fibroids.
IMPRESSION: Degenerative changes as above. No acute osseous finding by plain
radiography.

## 2023-10-10 ENCOUNTER — Encounter: Payer: Self-pay | Admitting: Dermatology

## 2023-10-10 ENCOUNTER — Ambulatory Visit: Attending: General Surgery | Admitting: Rehabilitation

## 2023-10-10 ENCOUNTER — Ambulatory Visit: Payer: Medicare PPO | Admitting: Dermatology

## 2023-10-10 ENCOUNTER — Encounter: Payer: Self-pay | Admitting: Rehabilitation

## 2023-10-10 DIAGNOSIS — R600 Localized edema: Secondary | ICD-10-CM | POA: Insufficient documentation

## 2023-10-10 DIAGNOSIS — R29898 Other symptoms and signs involving the musculoskeletal system: Secondary | ICD-10-CM | POA: Diagnosis not present

## 2023-10-10 DIAGNOSIS — L905 Scar conditions and fibrosis of skin: Secondary | ICD-10-CM | POA: Diagnosis not present

## 2023-10-10 DIAGNOSIS — L304 Erythema intertrigo: Secondary | ICD-10-CM

## 2023-10-10 MED ORDER — NYSTATIN-TRIAMCINOLONE 100000-0.1 UNIT/GM-% EX OINT: 1.0000 | TOPICAL_OINTMENT | Freq: Two times a day (BID) | CUTANEOUS | 0 refills | Status: AC

## 2023-10-10 NOTE — Patient Instructions (Signed)
 Manual Lymph Drainage for Left Breast.   Do daily.  Do slowly. Use flat hands with just enough pressure to stretch the skin. Do not slide over the skin, stretch the skin with the hand. (Stretch  Relax  Move) Lie down or sit comfortably (in a recliner, for example) to do this.   Do circles at each collar bone near the neck 5-7 times (to "wake up" lots of lymph nodes in this area).  Take slow deep breaths, allowing your belly to balloon out as you breathe in, 5x (to "wake up" abdominal lymph nodes).  Do circles in the left armpit--stretch skin in small circles to stimulate intact lymph nodes there, 5-7x.  4. Then work the breast up towards the armpit by stretching the skin - GENTLE

## 2023-10-10 NOTE — Progress Notes (Signed)
   New Patient Visit   Subjective  Renee [name] is a 73 y.o. female who presents for the following: rash on top of buttocks  Patient states she has rash located at the top of buttocks that she would like to have examined. Patient reports the areas have been there for 6 months. She reports the areas are bothersome.Patient rates irritation 8 out of 10. She states that the areas have not spread. Patient reports she has previously been treated for these areas. Patient stated that she had used hydrocortisone. Patient stated that she was prescribe a cream but unsure of the name.   Patient presents with a persistent rash in the buttocks area that has been present for an extended period. The rash has shown some improvement but continues to be problematic. The patient describes the affected area as feeling like raw skin, without itching.  The rash is located in a skin fold and has been present for "quite some time." It has not fully resolved despite the use of various treatments. The patient reports that the rash doesn't itch but feels like raw skin, indicating inflammation rather than pruritus. Previously, the patient frequently wore thin panty liners, which may have contributed to moisture in the area.   The patient has attempted several treatments for the rash. Hydrocortisone cream was tried but found to be ineffective. Subsequently, the patient reverted to using Vaseline. Wet wipes are used occasionally for hygiene, but primarily dry toilet paper is used at home. Despite these interventions, the rash has persisted, although with some improvement.  The following portions of the chart were reviewed this encounter and updated as appropriate: medications, allergies, medical history  Review of Systems:  No other skin or systemic complaints except as noted in HPI or Assessment and Plan.  Objective  Well appearing patient in no apparent distress; mood and affect are within normal limits.   A focused  examination was performed of the following areas:   Relevant exam findings are noted in the Assessment and Plan.    Assessment & Plan    . Intertrigo (genital area) - Assessment: Patient presents with a persistent rash in the genital area, described as hyperpigmented with a thin scale, resembling atrophic plaque. The rash causes discomfort but no itching, feeling like raw skin. Previous treatment with hydrocortisone cream was ineffective. Patient resorted to using Vaseline. Frequent use of thin panty liners may have contributed to moisture buildup. Diagnosed as intertrigo, with potential causes including candida infection or contact dermatitis, possibly from wet wipes.  - Plan:    Prescribe combination ointment of nystatin and triamcinolone for one week to address potential yeast overgrowth and inflammation    Recommend switching to Water Wipes (preservative-free) for hygiene    Suggest using Desitin or Avene Cicalfate healing balm during breaks from the prescribed ointment    Provide samples of Avene Cicalfate healing balm    Schedule follow-up appointment in 8 weeks to reassess condition and discuss skin treatment for aging    Patient to call back if the prescribed ointment is the same as previously used and ineffective    Return in about 8 weeks (around 12/05/2023) for INTERTRIGO f/u .  I, Zan Hess, CMA, am acting as scribe for [employer], DO.   Documentation: I have reviewed the above documentation for accuracy and completeness, and I agree with the above.  Louana Roup, DO

## 2023-10-10 NOTE — Patient Instructions (Addendum)
 Hello Renee [name],  Thank you for visiting today. Here is a summary of the key instructions:  - Medications: Use nystatin-triamcinolone ointment for 1 week   - During breaks from the ointment, use Desitin or Avene Cicalfate healing balm  - Skin Care:   - Switch to Water Wipes (preservative-free) for cleaning   - Stop using regular wet wipes   - Avoid wearing thin panty liners frequently  - Follow-up: Return in 8 weeks for a follow-up appointment  - Additional Instructions:   - Call the office if the prescribed ointment is the same as previously used and not effective   - Samples of Avene Cicalfate healing balm will be provided  Please reach out if you have any questions or concerns.  Warm regards,  Dr. Louana Roup, Dermatology    Important Information  Due to recent changes in healthcare laws, you may see results of your pathology and/or laboratory studies on MyChart before the doctors have had a chance to review them. We understand that in some cases there may be results that are confusing or concerning to you. Please understand that not all results are received at the same time and often the doctors may need to interpret multiple results in order to provide you with the best plan of care or course of treatment. Therefore, we ask that you please give us  2 business days to thoroughly review all your results before contacting the office for clarification. Should we see a critical lab result, you will be contacted sooner.   If You Need Anything After Your Visit  If you have any questions or concerns for your doctor, please call our main line at [phone] If no one answers, please leave a voicemail as directed and we will return your call as soon as possible. Messages left after 4 pm will be answered the following business day.   You may also send us  a message via MyChart. We typically respond to MyChart messages within 1-2 business days.  For prescription refills, please ask  your pharmacy to contact our office. Our fax number is [phone].  If you have an urgent issue when the clinic is closed that cannot wait until the next business day, you can page your doctor at the number below.    Please note that while we do our best to be available for urgent issues outside of office hours, we are not available 24/7.   If you have an urgent issue and are unable to reach us , you may choose to seek medical care at your doctor's office, retail clinic, urgent care center, or emergency room.  If you have a medical emergency, please immediately call 911 or go to the emergency department. In the event of inclement weather, please call our main line at [phone] for an update on the status of any delays or closures.  Dermatology Medication Tips: Please keep the boxes that topical medications come in in order to help keep track of the instructions about where and how to use these. Pharmacies typically print the medication instructions only on the boxes and not directly on the medication tubes.   If your medication is too expensive, please contact our office at [phone] or send us  a message through MyChart.   We are unable to tell what your co-pay for medications will be in advance as this is different depending on your insurance coverage. However, we may be able to find a substitute medication at lower cost or fill out paperwork to get insurance to  cover a needed medication.   If a prior authorization is required to get your medication covered by your insurance company, please allow us  1-2 business days to complete this process.  Drug prices often vary depending on where the prescription is filled and some pharmacies may offer cheaper prices.  The website www.goodrx.com contains coupons for medications through different pharmacies. The prices here do not account for what the cost may be with help from insurance (it may be cheaper with your insurance), but the website can  give you the price if you did not use any insurance.  - You can print the associated coupon and take it with your prescription to the pharmacy.  - You may also stop by our office during regular business hours and pick up a GoodRx coupon card.  - If you need your prescription sent electronically to a different pharmacy, notify our office through [facility] or by phone at [phone]

## 2023-10-10 NOTE — Therapy (Signed)
 OUTPATIENT PHYSICAL THERAPY  UPPER EXTREMITY ONCOLOGY EVALUATION  Patient Name: Renee [name] MRN: [id number] DOB:Jul 11, 1950, 73 y.o., female Today's Date: 10/10/2023  END OF SESSION:  PT End of Session - 10/10/23 1349     Visit Number 1    Number of Visits 5    Date for PT Re-Evaluation 10/31/23    Authorization Type not needed    PT Start Time 1107    PT Stop Time 1200    PT Time Calculation (min) 53 min    Activity Tolerance Patient tolerated treatment well    Behavior During Therapy WFL for tasks assessed/performed             Past Medical History:  Diagnosis Date   Diabetes mellitus without complication (HCC)    HSV infection    Hyperlipidemia    Hypertension    Past Surgical History:  Procedure Laterality Date   BREAST BIOPSY Right    BREAST BIOPSY Left 05/14/2023   MM LT BREAST BX W LOC DEV 1ST LESION IMAGE BX SPEC STEREO GUIDE 05/14/2023 [facility]   BREAST BIOPSY Left 05/22/2023   US  LT BREAST BX W LOC DEV 1ST LESION IMG BX SPEC US  GUIDE 05/22/2023 [facility]   BREAST BIOPSY  08/07/2023   MM LT RADIOACTIVE SEED LOC MAMMO GUIDE 08/07/2023 [facility]   RADIOACTIVE SEED GUIDED EXCISIONAL BREAST BIOPSY Left 08/08/2023   Procedure: LEFT BREAST SEED LOCALIZED EXCISIONAL BIOPSY;  Surgeon: Lockie Rima, MD;  Location: [facility];  Service: General;  Laterality: Left;   TOE SURGERY  06/12/1994   TUBAL LIGATION     WISDOM TOOTH EXTRACTION     Patient Active Problem List   Diagnosis Date Noted   Whiplash injuries, initial encounter 03/29/2022   Left foot pain 01/31/2022   Arthritis of midfoot 12/07/2021   Obesity 05/18/2021   Low back pain 05/18/2021   Vitamin D  deficiency 05/18/2021   Bilateral knee pain 05/18/2021   Somatic dysfunction of spine, sacral 05/18/2021   Post concussion syndrome 11/17/2014   Essential hypertension 02/27/2014   Hyperlipidemia 02/27/2014   Headache 05/22/2012   Diplopia 05/22/2012     PCP: Ruven Coy, MD  REFERRING PROVIDER: Lockie Rima, MD  REFERRING DIAG:  Diagnosis  N60.92 (ICD-10-CM) - Atypical ductal hyperplasia of left breast    THERAPY DIAG:  Localized edema  Scar tissue  Other symptoms and signs involving the musculoskeletal system  ONSET DATE: 05/2023  Rationale for Evaluation and Treatment: Rehabilitation  SUBJECTIVE:  SUBJECTIVE STATEMENT: I feel like I have no space between my breast and my arm.  I am also swollen.  I feel like I was fine up until around 2 weeks ago.  I did get a compression bra but when I went back this week they said it was too big.    PERTINENT HISTORY: Left atypical ductal hyperplasia 05/2023. With localized excisional biopsy 08/08/23. Started to have increased discomfort - US  showed no seroma. Other hx: high cholesterol, HBP, OA, DM, on ozempic   PAIN:  Are you having pain? Yes, it is a constant discomfort with occasional sharp pain NPRS scale: 2/10 Pain location: left breast and the sharp pain will be in the nipple  Pain orientation: Left  PAIN TYPE: aching and sharp Pain description: intermittent  Aggravating factors: I will hold the breast when I go down stairs.  Relieving factors: nothing so far   PRECAUTIONS: None  RED FLAGS: None   WEIGHT BEARING RESTRICTIONS: No  FALLS:  Has patient fallen in last 6 months? 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  COGNITION: Overall cognitive status: Within functional limits for tasks assessed   PALPATION: No pitting, mild scar tissue under incision,   OBSERVATIONS / OTHER ASSESSMENTS: Left breast appears larger in clothing, lateral trunk and lateral breast edema  POSTURE: rounded shoulders   UPPER EXTREMITY AROM/PROM: WNL  CERVICAL AROM: All within normal limits:   UPPER EXTREMITY STRENGTH: WNL  BREAST COMPLAINTS QUESTIONNAIRE    EVAL Pain:   8 Heaviness:  9 Swollen feeling: 9 Tense Skin:  8 Redness:  3 Bra Print:  9 Size of Pores:  0 Hard feeling:   9 Total:      55 /80 A Score over 9 indicates lymphedema issues in the breast                                                                                                                           TREATMENT DATE:  10/10/23 Eval performed Education on scar tissue massage and handout given with desensitization crossed out.  Education and performance on self MLD for the left breast modified to include:   Left collarbone, left axilla, 5 deep breaths, then Left breast towards the Lt axilla and collarbone PT performed in supine and educated pt on sidelying and seated options with pt performing with hand over hand cueing Handouts for both Edu on lymphatic system and anatomy of collectors.  Made 1/2" foam rectangle for lateral bra   PATIENT EDUCATION:  Education details: per today's note Person educated: Patient Education method: Explanation, Demonstration, Tactile cues, Verbal cues, and Handouts Education  comprehension: verbalized understanding and returned demonstration  HOME EXERCISE PROGRAM: Start self MLD use compression foam  ASSESSMENT:  CLINICAL IMPRESSION: Patient is a 73 y.o. female who was seen today for physical therapy evaluation and treatment for her left breast edema post excision of fibrose hyperplasia around 2 months ago.  Her compression bra seemed like maybe it was too large which was allowing her to  swell into it.  She got a better fitting bra this week.  Added foam for additional compression, started MLD for intact system.    OBJECTIVE IMPAIRMENTS: decreased knowledge of condition, decreased knowledge of use of DME, increased edema, and increased fascial restrictions.   ACTIVITY LIMITATIONS: none  PARTICIPATION LIMITATIONS: none  PERSONAL FACTORS: 1 comorbidity: scar tissue  are also affecting patient's functional outcome.   REHAB POTENTIAL: Excellent  CLINICAL DECISION MAKING: Stable/uncomplicated  EVALUATION COMPLEXITY: Low  GOALS: Goals reviewed with patient? Yes  SHORT TERM GOALS=LTGs: Target date: 10/31/23  Pt will be ind with self MLD for the left breast Baseline: Goal status: INITIAL  2.  Pt will be ind with scar massage for the left breast incision Baseline:  Goal status: INITIAL  3.  Pt will decrease breast edema questionnaire to 20 or less to demonstrate improved edema  Baseline:  Goal status: INITIAL   PLAN:  PT FREQUENCY: 2x/week  PT DURATION: 4 weeks   PLANNED INTERVENTIONS: 97110-Therapeutic exercises, 97535- Self Care, [id number]- Manual therapy, Patient/Family education, and Manual lymph drainage, Manual Therapy   PLAN FOR NEXT SESSION: review left breast MLD modified per instruction section or first visit note, scar work  Encarnacion Harris, PT 10/10/2023, 1:49 PM

## 2023-10-17 ENCOUNTER — Encounter: Payer: Self-pay | Admitting: Rehabilitation

## 2023-10-17 ENCOUNTER — Ambulatory Visit: Attending: General Surgery | Admitting: Rehabilitation

## 2023-10-17 DIAGNOSIS — R29898 Other symptoms and signs involving the musculoskeletal system: Secondary | ICD-10-CM | POA: Insufficient documentation

## 2023-10-17 DIAGNOSIS — L905 Scar conditions and fibrosis of skin: Secondary | ICD-10-CM | POA: Insufficient documentation

## 2023-10-17 DIAGNOSIS — R600 Localized edema: Secondary | ICD-10-CM | POA: Insufficient documentation

## 2023-10-17 NOTE — Therapy (Signed)
 OUTPATIENT PHYSICAL THERAPY  UPPER EXTREMITY ONCOLOGY TREATMENT  Patient Name: Renee [name] MRN: [id number] DOB:07-14-1950, 73 y.o., female Today's Date: 10/17/2023  END OF SESSION:  PT End of Session - 10/17/23 1255     Visit Number 2    Number of Visits 5    Date for PT Re-Evaluation 10/31/23    PT Start Time 1200    PT Stop Time 1255    PT Time Calculation (min) 55 min              Past Medical History:  Diagnosis Date   Diabetes mellitus without complication (HCC)    HSV infection    Hyperlipidemia    Hypertension    Past Surgical History:  Procedure Laterality Date   BREAST BIOPSY Right    BREAST BIOPSY Left 05/14/2023   MM LT BREAST BX W LOC DEV 1ST LESION IMAGE BX SPEC STEREO GUIDE 05/14/2023 [facility]   BREAST BIOPSY Left 05/22/2023   US  LT BREAST BX W LOC DEV 1ST LESION IMG BX SPEC US  GUIDE 05/22/2023 [facility]   BREAST BIOPSY  08/07/2023   MM LT RADIOACTIVE SEED LOC MAMMO GUIDE 08/07/2023 [facility]   RADIOACTIVE SEED GUIDED EXCISIONAL BREAST BIOPSY Left 08/08/2023   Procedure: LEFT BREAST SEED LOCALIZED EXCISIONAL BIOPSY;  Surgeon: Lockie Rima, MD;  [facility];  Service: General;  Laterality: Left;   TOE SURGERY  06/12/1994   TUBAL LIGATION     WISDOM TOOTH EXTRACTION     Patient Active Problem List   Diagnosis Date Noted   Whiplash injuries, initial encounter 03/29/2022   Left foot pain 01/31/2022   Arthritis of midfoot 12/07/2021   Obesity 05/18/2021   Low back pain 05/18/2021   Vitamin D  deficiency 05/18/2021   Bilateral knee pain 05/18/2021   Somatic dysfunction of spine, sacral 05/18/2021   Post concussion syndrome 11/17/2014   Essential hypertension 02/27/2014   Hyperlipidemia 02/27/2014   Headache 05/22/2012   Diplopia 05/22/2012    PCP: Ruven Coy, MD  REFERRING PROVIDER: Lockie Rima, MD  REFERRING DIAG:  Diagnosis  N60.92 (ICD-10-CM) - Atypical ductal hyperplasia of left  breast    THERAPY DIAG:  Localized edema  Scar tissue  Other symptoms and signs involving the musculoskeletal system  ONSET DATE: 05/2023  Rationale for Evaluation and Treatment: Rehabilitation  SUBJECTIVE:                                                                                                                                                                                           SUBJECTIVE STATEMENT: I think the foam has helped.  My breast and  arm seem to not be competing.  I want to review my exercises.     PERTINENT HISTORY: Left atypical ductal hyperplasia 05/2023. With localized excisional biopsy 08/08/23. Started to have increased discomfort - US  showed no seroma. Other hx: high cholesterol, HBP, OA, DM, on ozempic   PAIN:  Are you having pain? Yes, it is a constant discomfort with occasional sharp pain NPRS scale: 2/10 Pain location: left breast and the sharp pain will be in the nipple  Pain orientation: Left  PAIN TYPE: aching and sharp Pain description: intermittent  Aggravating factors: I will hold the breast when I go down stairs.  Relieving factors: nothing so far   PRECAUTIONS: None  RED FLAGS: None   WEIGHT BEARING RESTRICTIONS: No  FALLS:  Has patient fallen in last 6 months? 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  COGNITION: Overall cognitive status: Within functional limits for tasks assessed   PALPATION: No pitting, mild scar tissue under incision,   OBSERVATIONS / OTHER ASSESSMENTS: Left breast appears larger in clothing, lateral trunk and lateral breast  edema  POSTURE: rounded shoulders   UPPER EXTREMITY AROM/PROM: WNL  CERVICAL AROM: All within normal limits:   UPPER EXTREMITY STRENGTH: WNL  BREAST COMPLAINTS QUESTIONNAIRE    EVAL Pain:   8 Heaviness:  9 Swollen feeling: 9 Tense Skin:  8 Redness:  3 Bra Print:  9 Size of Pores:  0 Hard feeling:   9 Total:      55 /80 A Score over 9 indicates lymphedema issues in the breast                                                                                                                           TREATMENT DATE:  10/17/23 Reviewed self MLD and self scar massage in seated.  Pt will omit "pinching" part of scar massage that should have been removed anyway from the initial sheet.   Reprinted self MLD with picture as pt reports she must have been misreading the first one as she had forgotten axillary work and how to move the breast towards the lymph nodes.  Then PT performed in supine: scar massage to the lumpectomy incision but this feels improved since eval day.  Then In supine: Short neck, 5 diaphragmatic breaths, Lt axillary nodes, sternal nodes,  then L breast moving fluid towards closed set of lymph nodes.    10/10/23 Eval performed Education on scar tissue massage and handout given with desensitization crossed out.  Education and performance on self MLD for the left breast modified to include:   Left collarbone, left axilla, 5 deep breaths,  then Left breast towards the Lt axilla and collarbone PT performed in supine and educated pt on sidelying and seated options with pt performing with hand over hand cueing Handouts for both Edu on lymphatic system and anatomy of collectors.  Made 1/2" foam rectangle for lateral bra   PATIENT EDUCATION:  Education details: per today's note Person educated: Patient Education method: Explanation, Demonstration, Tactile cues, Verbal cues, and Handouts Education comprehension: verbalized understanding and returned demonstration  HOME  EXERCISE PROGRAM: Start self MLD use compression foam  ASSESSMENT:  CLINICAL IMPRESSION: Pt is not noticing her arm hitting her breast as much and has improved scar tissue at the breast incision.     OBJECTIVE IMPAIRMENTS: decreased knowledge of condition, decreased knowledge of use of DME, increased edema, and increased fascial restrictions.   ACTIVITY LIMITATIONS: none  PARTICIPATION LIMITATIONS: none  PERSONAL FACTORS: 1 comorbidity: scar tissue  are also affecting patient's functional outcome.   REHAB POTENTIAL: Excellent  CLINICAL DECISION MAKING: Stable/uncomplicated  EVALUATION COMPLEXITY: Low  GOALS: Goals reviewed with patient? Yes  SHORT TERM GOALS=LTGs: Target date: 10/31/23  Pt will be ind with self MLD for the left breast Baseline: Goal status: INITIAL  2.  Pt will be ind with scar massage for the left breast incision Baseline:  Goal status: INITIAL  3.  Pt will decrease breast edema questionnaire to 20 or less to demonstrate improved edema  Baseline:  Goal status: INITIAL   PLAN:  PT FREQUENCY: 2x/week  PT DURATION: 4 weeks   PLANNED INTERVENTIONS: 97110-Therapeutic exercises, 97535- Self Care, 95621- Manual therapy, Patient/Family education, and Manual lymph drainage, Manual Therapy   PLAN FOR NEXT SESSION: review left breast MLD modified per instruction section or first visit note, scar work  Encarnacion Harris, PT 10/17/2023, 3:42 PM

## 2023-10-19 ENCOUNTER — Ambulatory Visit: Admitting: Rehabilitation

## 2023-10-19 DIAGNOSIS — R29898 Other symptoms and signs involving the musculoskeletal system: Secondary | ICD-10-CM | POA: Diagnosis not present

## 2023-10-19 DIAGNOSIS — L905 Scar conditions and fibrosis of skin: Secondary | ICD-10-CM

## 2023-10-19 DIAGNOSIS — R600 Localized edema: Secondary | ICD-10-CM

## 2023-10-19 NOTE — Therapy (Unsigned)
 OUTPATIENT PHYSICAL THERAPY  UPPER EXTREMITY ONCOLOGY TREATMENT  Patient Name: Renee [name] MRN: [id number] DOB:May 09, 1951, 73 y.o., female Today's Date: 10/19/2023  END OF SESSION:     Past Medical History:  Diagnosis Date   Diabetes mellitus without complication (HCC)    HSV infection    Hyperlipidemia    Hypertension    Past Surgical History:  Procedure Laterality Date   BREAST BIOPSY Right    BREAST BIOPSY Left 05/14/2023   MM LT BREAST BX W LOC DEV 1ST LESION IMAGE BX SPEC STEREO GUIDE 05/14/2023 [facility]   BREAST BIOPSY Left 05/22/2023   US  LT BREAST BX W LOC DEV 1ST LESION IMG BX SPEC US  GUIDE 05/22/2023 [facility]   BREAST BIOPSY  08/07/2023   MM LT RADIOACTIVE SEED LOC MAMMO GUIDE 08/07/2023 [facility]   RADIOACTIVE SEED GUIDED EXCISIONAL BREAST BIOPSY Left 08/08/2023   Procedure: LEFT BREAST SEED LOCALIZED EXCISIONAL BIOPSY;  Surgeon: Lockie Rima, MD;  Location: [facility];  Service: General;  Laterality: Left;   TOE SURGERY  06/12/1994   TUBAL LIGATION     WISDOM TOOTH EXTRACTION     Patient Active Problem List   Diagnosis Date Noted   Whiplash injuries, initial encounter 03/29/2022   Left foot pain 01/31/2022   Arthritis of midfoot 12/07/2021   Obesity 05/18/2021   Low back pain 05/18/2021   Vitamin D  deficiency 05/18/2021   Bilateral knee pain 05/18/2021   Somatic dysfunction of spine, sacral 05/18/2021   Post concussion syndrome 11/17/2014   Essential hypertension 02/27/2014   Hyperlipidemia 02/27/2014   Headache 05/22/2012   Diplopia 05/22/2012    PCP: Ruven Coy, MD  REFERRING PROVIDER: Lockie Rima, MD  REFERRING DIAG:  Diagnosis  N60.92 (ICD-10-CM) - Atypical ductal hyperplasia of left breast    THERAPY DIAG:  No diagnosis found.  ONSET DATE: 05/2023  Rationale for Evaluation and Treatment: Rehabilitation  SUBJECTIVE:                                                                                                                                                                                            SUBJECTIVE STATEMENT: I think the foam has helped.  My breast and arm seem to not be competing.  I want to review my exercises.     PERTINENT HISTORY: Left atypical ductal hyperplasia 05/2023. With localized excisional biopsy 08/08/23. Started to have increased discomfort - US  showed no seroma. Other hx: high cholesterol, HBP, OA, DM, on ozempic   PAIN:  Are you having pain? Yes, it is a constant discomfort with occasional sharp pain NPRS scale: 2/10 Pain location: left breast  and the sharp pain will be in the nipple  Pain orientation: Left  PAIN TYPE: aching and sharp Pain description: intermittent  Aggravating factors: I will hold the breast when I go down stairs.  Relieving factors: nothing so far   PRECAUTIONS: None  RED FLAGS: None   WEIGHT BEARING RESTRICTIONS: No  FALLS:  Has patient fallen in last 6 months? 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  COGNITION: Overall cognitive status: Within functional limits for tasks assessed   PALPATION: No pitting, mild scar tissue under incision,   OBSERVATIONS / OTHER ASSESSMENTS: Left breast appears larger in clothing, lateral trunk and lateral breast edema  POSTURE: rounded shoulders   UPPER EXTREMITY AROM/PROM: WNL  CERVICAL AROM: All within normal limits:   UPPER EXTREMITY STRENGTH: WNL  BREAST COMPLAINTS QUESTIONNAIRE    EVAL Pain:   8 Heaviness:  9 Swollen feeling: 9 Tense Skin:  8 Redness:  3 Bra Print:  9 Size of  Pores:  0 Hard feeling:   9 Total:      55 /80 A Score over 9 indicates lymphedema issues in the breast                                                                                                                           TREATMENT DATE:  10/17/23 Reviewed self MLD and self scar massage in seated.  Pt will omit "pinching" part of scar massage that should have been removed anyway from the initial sheet.   Reprinted self MLD with picture as pt reports she must have been misreading the first one as she had forgotten axillary work and how to move the breast towards the lymph nodes.  Then PT performed in supine: scar massage to the lumpectomy incision but this feels improved since eval day.  Then In supine: Short neck, 5 diaphragmatic breaths, Lt axillary nodes, sternal nodes,  then L breast moving fluid towards closed set of lymph nodes.    10/10/23 Eval performed Education on scar tissue massage and handout given with desensitization crossed out.  Education and performance on self MLD for the left breast modified to include:   Left collarbone, left axilla, 5 deep breaths, then Left breast towards the Lt axilla and collarbone PT performed in supine and educated pt on sidelying and seated options with pt performing with hand over hand cueing Handouts for both Edu on lymphatic system and anatomy of collectors.  Made 1/2" foam rectangle for lateral bra   PATIENT EDUCATION:  Education details: per today's note Person educated: Patient Education method: Explanation, Demonstration, Tactile cues, Verbal cues, and Handouts Education comprehension:  verbalized understanding and returned demonstration  HOME EXERCISE PROGRAM: Start self MLD use compression foam  ASSESSMENT:  CLINICAL IMPRESSION: Pt is not noticing her arm hitting her breast as much and has improved scar tissue at the breast incision.     OBJECTIVE IMPAIRMENTS: decreased knowledge of condition, decreased knowledge of use of  DME, increased edema, and increased fascial restrictions.   ACTIVITY LIMITATIONS: none  PARTICIPATION LIMITATIONS: none  PERSONAL FACTORS: 1 comorbidity: scar tissue are also affecting patient's functional outcome.   REHAB POTENTIAL: Excellent  CLINICAL DECISION MAKING: Stable/uncomplicated  EVALUATION COMPLEXITY: Low  GOALS: Goals reviewed with patient? Yes  SHORT TERM GOALS=LTGs: Target date: 10/31/23  Pt will be ind with self MLD for the left breast Baseline: Goal status: INITIAL  2.  Pt will be ind with scar massage for the left breast incision Baseline:  Goal status: INITIAL  3.  Pt will decrease breast edema questionnaire to 20 or less to demonstrate improved edema  Baseline:  Goal status: INITIAL   PLAN:  PT FREQUENCY: 2x/week  PT DURATION: 4 weeks   PLANNED INTERVENTIONS: 97110-Therapeutic exercises, 97535- Self Care, [id number]- Manual therapy, Patient/Family education, and Manual lymph drainage, Manual Therapy   PLAN FOR NEXT SESSION: review left breast MLD modified per instruction section or first visit note, scar work  Encarnacion Harris, PT 10/19/2023, 11:00 AM

## 2023-10-22 ENCOUNTER — Encounter: Payer: Self-pay | Admitting: Rehabilitation

## 2023-10-23 ENCOUNTER — Encounter: Payer: Self-pay | Admitting: Rehabilitation

## 2023-10-23 ENCOUNTER — Ambulatory Visit: Admitting: Rehabilitation

## 2023-10-23 DIAGNOSIS — R600 Localized edema: Secondary | ICD-10-CM | POA: Diagnosis not present

## 2023-10-23 DIAGNOSIS — L905 Scar conditions and fibrosis of skin: Secondary | ICD-10-CM

## 2023-10-23 DIAGNOSIS — R29898 Other symptoms and signs involving the musculoskeletal system: Secondary | ICD-10-CM | POA: Diagnosis not present

## 2023-10-23 NOTE — Therapy (Signed)
 OUTPATIENT PHYSICAL THERAPY  UPPER EXTREMITY ONCOLOGY TREATMENT  Patient Name: Renee [name] MRN: [id number] DOB:1950/07/25, 73 y.o., female Today's Date: 10/23/2023  END OF SESSION:  PT End of Session - 10/23/23 0800     Visit Number 4    Number of Visits 5    Date for PT Re-Evaluation 10/31/23    Authorization Type 5 visits 10/10/23-10/31/23    Authorization - Visit Number 4    Authorization - Number of Visits 5    PT Start Time 0800    PT Stop Time 0842    PT Time Calculation (min) 42 min    Activity Tolerance Patient tolerated treatment well    Behavior During Therapy [facility] for tasks assessed/performed               Past Medical History:  Diagnosis Date   Diabetes mellitus without complication (HCC)    HSV infection    Hyperlipidemia    Hypertension    Past Surgical History:  Procedure Laterality Date   BREAST BIOPSY Right    BREAST BIOPSY Left 05/14/2023   MM LT BREAST BX W LOC DEV 1ST LESION IMAGE BX SPEC STEREO GUIDE 05/14/2023 [facility]   BREAST BIOPSY Left 05/22/2023   US  LT BREAST BX W LOC DEV 1ST LESION IMG BX SPEC US  GUIDE 05/22/2023 [facility]   BREAST BIOPSY  08/07/2023   MM LT RADIOACTIVE SEED LOC MAMMO GUIDE 08/07/2023 [facility]   RADIOACTIVE SEED GUIDED EXCISIONAL BREAST BIOPSY Left 08/08/2023   Procedure: LEFT BREAST SEED LOCALIZED EXCISIONAL BIOPSY;  Surgeon: Lockie Rima, MD;  Location: [facility];  Service: General;  Laterality: Left;   TOE SURGERY  06/12/1994   TUBAL LIGATION     WISDOM TOOTH EXTRACTION     Patient Active Problem List   Diagnosis Date Noted   Whiplash injuries, initial encounter 03/29/2022   Left foot pain 01/31/2022   Arthritis of midfoot 12/07/2021   Obesity 05/18/2021   Low back pain 05/18/2021   Vitamin D  deficiency 05/18/2021   Bilateral knee pain 05/18/2021   Somatic dysfunction of spine, sacral 05/18/2021   Post concussion syndrome 11/17/2014   Essential hypertension  02/27/2014   Hyperlipidemia 02/27/2014   Headache 05/22/2012   Diplopia 05/22/2012    PCP: Ruven Coy, MD  REFERRING PROVIDER: Lockie Rima, MD  REFERRING DIAG:  Diagnosis  N60.92 (ICD-10-CM) - Atypical ductal hyperplasia of left breast    THERAPY DIAG:  Localized edema  Scar tissue  Other symptoms and signs involving the musculoskeletal system  ONSET DATE: 05/2023  Rationale for Evaluation and Treatment: Rehabilitation  SUBJECTIVE:  SUBJECTIVE STATEMENT:    I am very motivated by how it feels.    PERTINENT HISTORY: Left atypical ductal hyperplasia 05/2023. With localized excisional biopsy 08/08/23. Started to have increased discomfort - US  showed no seroma. Other hx: high cholesterol, HBP, OA, DM, on ozempic   PAIN:  Are you having pain? Yes, it is a constant discomfort with occasional sharp pain NPRS scale: 2/10 Pain location: left breast and the sharp pain will be in the nipple  Pain orientation: Left  PAIN TYPE: aching and sharp Pain description: intermittent  Aggravating factors: I will hold the breast when I go down stairs.  Relieving factors: nothing so far   PRECAUTIONS: None  RED FLAGS: None   WEIGHT BEARING RESTRICTIONS: No  FALLS:  Has patient fallen in last 6 months? 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  COGNITION: Overall cognitive status: Within functional limits for tasks  assessed   PALPATION: No pitting, mild scar tissue under incision,   OBSERVATIONS / OTHER ASSESSMENTS: Left breast appears larger in clothing, lateral trunk and lateral breast edema  POSTURE: rounded shoulders   UPPER EXTREMITY AROM/PROM: WNL  CERVICAL AROM: All within normal limits:   UPPER EXTREMITY STRENGTH: WNL  BREAST COMPLAINTS QUESTIONNAIRE    EVAL Pain:   8 Heaviness:  9 Swollen feeling: 9 Tense Skin:  8 Redness:  3 Bra Print:  9 Size of Pores:  0 Hard feeling:   9 Total:      55 /80 A Score over 9 indicates lymphedema issues in the breast                                                                                                                           TREATMENT DATE:  10/23/23 PT performed in supine: scar massage to the lumpectomy incision but this feels improved since eval day.  Then In supine: Short neck, 5 diaphragmatic breaths, Lt axillary nodes, sternal nodes,  then L breast moving fluid towards closed set of lymph nodes.   10/22/23 Then PT performed in supine: scar massage to the lumpectomy incision but this feels improved since eval day.  Then In supine: Short neck, 5 diaphragmatic breaths, Lt axillary nodes, sternal nodes,  then L breast moving fluid towards closed set of lymph nodes.   10/17/23 Reviewed self MLD and self scar massage in seated.  Pt will omit "pinching" part of scar massage that should have been removed anyway from the initial sheet.   Reprinted self MLD with picture as pt reports she must have been misreading the first one as she had forgotten axillary work and how to move  the breast towards the lymph nodes.  Then PT performed in supine: scar massage to the lumpectomy incision but this feels improved since eval day.  Then In supine: Short neck, 5 diaphragmatic breaths, Lt axillary nodes, sternal nodes,  then L breast moving fluid towards closed set of lymph nodes.    10/10/23 Eval performed Education on scar tissue massage and handout  given with desensitization crossed out.  Education and performance on self MLD for the left breast modified to include:   Left collarbone, left axilla, 5 deep breaths, then Left breast towards the Lt axilla and collarbone PT performed in supine and educated pt on sidelying and seated options with pt performing with hand over hand cueing Handouts for both Edu on lymphatic system and anatomy of collectors.  Made 1/2" foam rectangle for lateral bra   PATIENT EDUCATION:  Education details: per today's note Person educated: Patient Education method: Explanation, Demonstration, Tactile cues, Verbal cues, and Handouts Education comprehension: verbalized understanding and returned demonstration  HOME EXERCISE PROGRAM: Start self MLD use compression foam  ASSESSMENT:  CLINICAL IMPRESSION: Anticipate 1 more visit.    OBJECTIVE IMPAIRMENTS: decreased knowledge of condition, decreased knowledge of use of DME, increased edema, and increased fascial restrictions.   ACTIVITY LIMITATIONS: none  PARTICIPATION LIMITATIONS: none  PERSONAL FACTORS: 1 comorbidity: scar tissue are also affecting patient's functional outcome.   REHAB POTENTIAL: Excellent  CLINICAL DECISION MAKING: Stable/uncomplicated  EVALUATION COMPLEXITY: Low  GOALS: Goals reviewed with patient? Yes  SHORT TERM GOALS=LTGs: Target date: 10/31/23  Pt will be ind with self MLD for the left breast Baseline: Goal status: INITIAL  2.  Pt will be ind with scar massage for the left breast incision Baseline:  Goal status: INITIAL  3.  Pt will decrease breast edema questionnaire to 20 or less to demonstrate improved edema  Baseline:  Goal status: INITIAL   PLAN:  PT FREQUENCY: 2x/week  PT DURATION: 4 weeks   PLANNED INTERVENTIONS: 97110-Therapeutic exercises, 97535- Self Care, 16109- Manual therapy, Patient/Family education, and Manual lymph drainage, Manual Therapy   PLAN FOR NEXT SESSION: review left breast MLD  modified per instruction section or first visit note, scar work  Encarnacion Harris, PT 10/23/2023, 8:43 AM

## 2023-10-25 ENCOUNTER — Encounter: Payer: Self-pay | Admitting: Rehabilitation

## 2023-10-25 ENCOUNTER — Ambulatory Visit: Admitting: Rehabilitation

## 2023-10-25 DIAGNOSIS — R29898 Other symptoms and signs involving the musculoskeletal system: Secondary | ICD-10-CM | POA: Diagnosis not present

## 2023-10-25 DIAGNOSIS — L905 Scar conditions and fibrosis of skin: Secondary | ICD-10-CM

## 2023-10-25 DIAGNOSIS — R600 Localized edema: Secondary | ICD-10-CM

## 2023-10-25 NOTE — Therapy (Signed)
 OUTPATIENT PHYSICAL THERAPY  UPPER EXTREMITY ONCOLOGY TREATMENT  Patient Name: Renee [name] MRN: [id number] DOB:March 04, 1951, 73 y.o., female Today's Date: 10/25/2023  END OF SESSION:  PT End of Session - 10/25/23 0850     Visit Number 5    Number of Visits 5    PT Start Time 0800    PT Stop Time 0848    PT Time Calculation (min) 48 min    Activity Tolerance Patient tolerated treatment well    Behavior During Therapy WFL for tasks assessed/performed                Past Medical History:  Diagnosis Date   Diabetes mellitus without complication (HCC)    HSV infection    Hyperlipidemia    Hypertension    Past Surgical History:  Procedure Laterality Date   BREAST BIOPSY Right    BREAST BIOPSY Left 05/14/2023   MM LT BREAST BX W LOC DEV 1ST LESION IMAGE BX SPEC STEREO GUIDE 05/14/2023 [facility]   BREAST BIOPSY Left 05/22/2023   US  LT BREAST BX W LOC DEV 1ST LESION IMG BX SPEC US  GUIDE 05/22/2023 [facility]   BREAST BIOPSY  08/07/2023   MM LT RADIOACTIVE SEED LOC MAMMO GUIDE 08/07/2023 [facility]   RADIOACTIVE SEED GUIDED EXCISIONAL BREAST BIOPSY Left 08/08/2023   Procedure: LEFT BREAST SEED LOCALIZED EXCISIONAL BIOPSY;  Surgeon: Lockie Rima, MD;  Location: [facility];  Service: General;  Laterality: Left;   TOE SURGERY  06/12/1994   TUBAL LIGATION     WISDOM TOOTH EXTRACTION     Patient Active Problem List   Diagnosis Date Noted   Whiplash injuries, initial encounter 03/29/2022   Left foot pain 01/31/2022   Arthritis of midfoot 12/07/2021   Obesity 05/18/2021   Low back pain 05/18/2021   Vitamin D  deficiency 05/18/2021   Bilateral knee pain 05/18/2021   Somatic dysfunction of spine, sacral 05/18/2021   Post concussion syndrome 11/17/2014   Essential hypertension 02/27/2014   Hyperlipidemia 02/27/2014   Headache 05/22/2012   Diplopia 05/22/2012    PCP: Ruven Coy, MD  REFERRING PROVIDER: Lockie Rima,  MD  REFERRING DIAG:  Diagnosis  N60.92 (ICD-10-CM) - Atypical ductal hyperplasia of left breast    THERAPY DIAG:  Localized edema  Scar tissue  Other symptoms and signs involving the musculoskeletal system  ONSET DATE: 05/2023  Rationale for Evaluation and Treatment: Rehabilitation  SUBJECTIVE:  SUBJECTIVE STATEMENT:    Ready to try D/C  PERTINENT HISTORY: Left atypical ductal hyperplasia 05/2023. With localized excisional biopsy 08/08/23. Started to have increased discomfort - US  showed no seroma. Other hx: high cholesterol, HBP, OA, DM, on ozempic   PAIN:  Are you having pain? Yes, it is a constant discomfort with occasional sharp pain NPRS scale: 0-2/10 up to 8/10 Pain location: left breast and the sharp pain will be in the nipple  Pain orientation: Left  PAIN TYPE: aching and sharp Pain description: intermittent  Aggravating factors: I will hold the breast when I go down stairs.  Relieving factors: nothing so far   PRECAUTIONS: None  RED FLAGS: None   WEIGHT BEARING RESTRICTIONS: No  FALLS:  Has patient fallen in last 6 months? 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  COGNITION: Overall cognitive status: Within functional limits for tasks assessed   PALPATION: No pitting, mild scar tissue under incision,   OBSERVATIONS / OTHER ASSESSMENTS: Left breast appears larger in clothing, lateral trunk and lateral  breast edema  POSTURE: rounded shoulders   UPPER EXTREMITY AROM/PROM: WNL  CERVICAL AROM: All within normal limits:   UPPER EXTREMITY STRENGTH: WNL  BREAST COMPLAINTS QUESTIONNAIRE    EVAL   10/25/23 Pain:   8   5 Heaviness:  9   4 Swollen feeling: 9   4 Tense Skin:  8   3 Redness:  3   0 Bra Print:  9   2 Size of Pores:  0   0 Hard feeling:   9   4 Total:      55 /80   22/80 A Score over 9 indicates lymphedema issues in the breast                                                                                                                           TREATMENT DATE:  10/25/23 PT performed in supine: scar massage to the lumpectomy incision but this feels improved since eval day.  Then In supine: Short neck, 5 diaphragmatic breaths, Lt axillary nodes, sternal nodes,  then L breast moving fluid towards closed set of lymph nodes.   10/23/23 PT performed in supine: scar massage to the lumpectomy incision but this feels improved since eval day.  Then In supine: Short neck, 5 diaphragmatic breaths, Lt axillary nodes, sternal nodes,  then L breast moving fluid towards closed set of lymph nodes.   10/22/23 Then PT performed in supine: scar massage to the lumpectomy incision but this feels improved since eval day.  Then In supine: Short neck, 5 diaphragmatic breaths, Lt axillary nodes,  sternal nodes,  then L breast moving fluid towards closed set of lymph nodes.   10/17/23 Reviewed self MLD and self scar massage in seated.  Pt will omit "pinching" part of scar massage that should have been removed anyway from the initial sheet.   Reprinted self MLD with picture as pt reports she must have been misreading the first one as she had forgotten axillary work and how to move the breast towards the lymph nodes.  Then PT performed in supine: scar massage to the lumpectomy incision but this feels improved since eval day.  Then In supine: Short neck, 5 diaphragmatic breaths, Lt axillary nodes, sternal  nodes,  then L breast moving fluid towards closed set of lymph nodes.    10/10/23 Eval performed Education on scar tissue massage and handout given with desensitization crossed out.  Education and performance on self MLD for the left breast modified to include:   Left collarbone, left axilla, 5 deep breaths, then Left breast towards the Lt axilla and collarbone PT performed in supine and educated pt on sidelying and seated options with pt performing with hand over hand cueing Handouts for both Edu on lymphatic system and anatomy of collectors.  Made 1/2" foam rectangle for lateral bra   PATIENT EDUCATION:  Education details: per today's note Person educated: Patient Education method: Explanation, Demonstration, Tactile cues, Verbal cues, and Handouts Education comprehension: verbalized understanding and returned demonstration  HOME EXERCISE PROGRAM: Start self MLD use compression foam  ASSESSMENT:  CLINICAL IMPRESSION: Pt is now ready for DC.  She almost met her Breast complaints questionnaire, missing it by 2 points, but a big improvement from 55 on eval.  Her incision has softened greatly.   OBJECTIVE IMPAIRMENTS: decreased knowledge of condition, decreased knowledge of use of DME, increased edema, and increased fascial restrictions.   ACTIVITY LIMITATIONS: none  PARTICIPATION LIMITATIONS: none  PERSONAL FACTORS: 1 comorbidity: scar tissue are also affecting patient's functional outcome.   REHAB POTENTIAL: Excellent  CLINICAL DECISION MAKING: Stable/uncomplicated  EVALUATION COMPLEXITY: Low  GOALS: Goals reviewed with patient? Yes  SHORT TERM GOALS=LTGs: Target date: 10/31/23  Pt will be ind with self MLD for the left breast Baseline: Goal status: MET  2.  Pt will be ind with scar massage for the left breast incision Baseline:  Goal status: MET  3.  Pt will decrease breast edema questionnaire to 20 or less to demonstrate improved edema  Baseline:  Goal status:  MOSTLY MET   PLAN:  PT FREQUENCY: 2x/week  PT DURATION: 4 weeks   PLANNED INTERVENTIONS: 97110-Therapeutic exercises, 97535- Self Care, 16109- Manual therapy, Patient/Family education, and Manual lymph drainage, Manual Therapy   PLAN FOR NEXT SESSION: review left breast MLD modified per instruction section or first visit note, scar work  Encarnacion Harris, PT 10/25/2023, 8:50 AM   PHYSICAL THERAPY DISCHARGE SUMMARY  Visits from Start of Care: 5  Current functional level related to goals / functional outcomes: See above   Remaining deficits: Scar tissue   Education / Equipment: Final self care  Plan: Patient agrees to discharge.  . Patient is being discharged due to meeting the stated rehab goals.

## 2023-11-06 NOTE — Progress Notes (Unsigned)
 [facility] [address] [place] [address] Phone: [phone] Subjective:   Renee [name], am serving as a scribe for Dr. Ronnell Coins.  I'm seeing this patient by the request  of:  Ruven Coy, MD  CC: Back and neck pain follow-up  [id number]  Renee [name] is a 73 y.o. female coming in with complaint of back and neck pain. OMT 09/17/2023. Patient states that her lower back is the same as last visit. Not been as active.   Medications patient has been prescribed: None  Taking:  Reviewing patient's chart has been in formal physical therapy on a regular basis.  This is more for the localized edema after breast biopsy.       Reviewed prior external information including notes and imaging from previsou exam, outside providers and external EMR if available.   As well as notes that were available from care everywhere and other healthcare systems.  Past medical history, social, surgical and family history all reviewed in electronic medical record.  No pertanent information unless stated regarding to the chief complaint.   Past Medical History:  Diagnosis Date   Diabetes mellitus without complication (HCC)    HSV infection    Hyperlipidemia    Hypertension     Allergies  Allergen Reactions   Codeine Nausea Only     Review of Systems:  No headache, visual changes, nausea, vomiting, diarrhea, constipation, dizziness, abdominal pain, skin rash, fevers, chills, night sweats, weight loss, swollen lymph nodes, body aches, joint swelling, chest pain, shortness of breath, mood changes. POSITIVE muscle aches  Objective  Blood pressure 118/82, pulse 84, height 5\' 4"  (1.626 m), weight 167 lb (75.8 kg), SpO2 98%.   General: No apparent distress alert and oriented x3 mood and affect normal, dressed appropriately.  HEENT: Pupils equal, extraocular movements intact  Respiratory: Patient's speak in full sentences and does not appear  short of breath  Cardiovascular: No lower extremity edema, non tender, no erythema  Gait MSK:  Back does have some loss lordosis noted.  Some tenderness to palpation in the paraspinal musculature.  Tightness with Veldon German right greater than left.  Osteopathic findings  C2 flexed rotated and side bent right C6 flexed rotated and side bent left C7 flexed rotated and side bent left T3 extended rotated and side bent right inhaled rib T9 extended rotated and side bent left L2 flexed rotated and side bent right L3 flexed rotated and side bent left Sacrum right on right       Assessment and Plan:  Low back pain Known unfortunately degenerative disc and lumbar stenosis.  With patient again about icing regimen and home exercises, continue to work on core strength and avoid any type of repetitive extension of the back.  Have discussed different medications.  Discussed icing regimen and home exercises.  Discussed which activities to do and which ones to avoid.  Follow-up again in 6 to 8 weeks otherwise.    Nonallopathic problems  Decision today to treat with OMT was based on Physical Exam  After verbal consent patient was treated with HVLA, ME, FPR techniques in cervical, rib, thoracic, lumbar, and sacral  areas, avoided HVLA on the cervical area.  Patient tolerated the procedure well with improvement in symptoms  Patient given exercises, stretches and lifestyle modifications  See medications in patient instructions if given  Patient will follow up in 4-8 weeks     The above documentation has been reviewed and is accurate and complete  Renee Margo, DO The        Note: This dictation was prepared with Dragon dictation along with smaller phrase technology. Any transcriptional errors that result from this process are unintentional.

## 2023-11-07 DIAGNOSIS — N6489 Other specified disorders of breast: Secondary | ICD-10-CM | POA: Diagnosis not present

## 2023-11-07 DIAGNOSIS — Z9012 Acquired absence of left breast and nipple: Secondary | ICD-10-CM | POA: Diagnosis not present

## 2023-11-07 DIAGNOSIS — N6092 Unspecified benign mammary dysplasia of left breast: Secondary | ICD-10-CM | POA: Diagnosis not present

## 2023-11-08 ENCOUNTER — Encounter: Payer: Self-pay | Admitting: Family Medicine

## 2023-11-08 ENCOUNTER — Ambulatory Visit: Admitting: Family Medicine

## 2023-11-08 VITALS — BP 118/82 | HR 84 | Ht 64.0 in | Wt 167.0 lb

## 2023-11-08 DIAGNOSIS — M9902 Segmental and somatic dysfunction of thoracic region: Secondary | ICD-10-CM | POA: Diagnosis not present

## 2023-11-08 DIAGNOSIS — M545 Low back pain, unspecified: Secondary | ICD-10-CM | POA: Diagnosis not present

## 2023-11-08 DIAGNOSIS — M9903 Segmental and somatic dysfunction of lumbar region: Secondary | ICD-10-CM

## 2023-11-08 DIAGNOSIS — M9904 Segmental and somatic dysfunction of sacral region: Secondary | ICD-10-CM

## 2023-11-08 DIAGNOSIS — G8929 Other chronic pain: Secondary | ICD-10-CM

## 2023-11-08 DIAGNOSIS — M9908 Segmental and somatic dysfunction of rib cage: Secondary | ICD-10-CM | POA: Diagnosis not present

## 2023-11-08 DIAGNOSIS — M9901 Segmental and somatic dysfunction of cervical region: Secondary | ICD-10-CM

## 2023-11-08 NOTE — Assessment & Plan Note (Signed)
 Known unfortunately degenerative disc and lumbar stenosis.  With patient again about icing regimen and home exercises, continue to work on core strength and avoid any type of repetitive extension of the back.  Have discussed different medications.  Discussed icing regimen and home exercises.  Discussed which activities to do and which ones to avoid.  Follow-up again in 6 to 8 weeks otherwise.

## 2023-11-08 NOTE — Patient Instructions (Signed)
 Metatarsal pads Wart remover cream daily with bandaid for 1 week then use pumice stone See me again in 8 weeks

## 2023-11-12 DIAGNOSIS — L304 Erythema intertrigo: Secondary | ICD-10-CM | POA: Insufficient documentation

## 2023-11-13 ENCOUNTER — Other Ambulatory Visit (HOSPITAL_BASED_OUTPATIENT_CLINIC_OR_DEPARTMENT_OTHER): Payer: Self-pay | Admitting: Obstetrics and Gynecology

## 2023-11-13 DIAGNOSIS — M8580 Other specified disorders of bone density and structure, unspecified site: Secondary | ICD-10-CM

## 2023-11-13 DIAGNOSIS — Z1330 Encounter for screening examination for mental health and behavioral disorders, unspecified: Secondary | ICD-10-CM | POA: Diagnosis not present

## 2023-11-13 DIAGNOSIS — Z1231 Encounter for screening mammogram for malignant neoplasm of breast: Secondary | ICD-10-CM | POA: Diagnosis not present

## 2023-11-13 DIAGNOSIS — Z124 Encounter for screening for malignant neoplasm of cervix: Secondary | ICD-10-CM | POA: Diagnosis not present

## 2023-11-13 DIAGNOSIS — Z1211 Encounter for screening for malignant neoplasm of colon: Secondary | ICD-10-CM | POA: Diagnosis not present

## 2023-11-13 DIAGNOSIS — Z780 Asymptomatic menopausal state: Secondary | ICD-10-CM | POA: Diagnosis not present

## 2023-11-13 DIAGNOSIS — L304 Erythema intertrigo: Secondary | ICD-10-CM | POA: Diagnosis not present

## 2023-11-13 DIAGNOSIS — Z01419 Encounter for gynecological examination (general) (routine) without abnormal findings: Secondary | ICD-10-CM | POA: Diagnosis not present

## 2023-11-21 DIAGNOSIS — E119 Type 2 diabetes mellitus without complications: Secondary | ICD-10-CM | POA: Diagnosis not present

## 2023-12-05 ENCOUNTER — Ambulatory Visit: Admitting: Dermatology

## 2023-12-05 ENCOUNTER — Encounter: Payer: Self-pay | Admitting: Dermatology

## 2023-12-05 VITALS — BP 122/78 | HR 76

## 2023-12-05 DIAGNOSIS — R238 Other skin changes: Secondary | ICD-10-CM | POA: Diagnosis not present

## 2023-12-05 DIAGNOSIS — L304 Erythema intertrigo: Secondary | ICD-10-CM

## 2023-12-05 DIAGNOSIS — Z7189 Other specified counseling: Secondary | ICD-10-CM | POA: Diagnosis not present

## 2023-12-05 DIAGNOSIS — R9230 Dense breasts, unspecified: Secondary | ICD-10-CM | POA: Insufficient documentation

## 2023-12-05 DIAGNOSIS — B009 Herpesviral infection, unspecified: Secondary | ICD-10-CM | POA: Insufficient documentation

## 2023-12-05 NOTE — Patient Instructions (Addendum)
 Date: Wed Dec 05 2023  Hello Renee [name],  Thank you for visiting today. Here is a summary of the key instructions:  - Skin Care:   - Apply a thin layer of Cicalfate balm to the affected area in the morning and afternoon   - Use powder to keep the area dry   - Continue using Dove soap, CeraVe, or Neutrogena facial cleanser (we gave you samples of other products you can also try)   - Start using daily moisturizer   - Use lotions for summer months and creams for winter months   - Wear sunscreen when outdoors for extended periods  - General Instructions:   - Send messages through MyChart for any questions   - Remove makeup before sleeping  - Follow-up:   - Continue current regimen as it is showing improvement  Please reach out if you have any questions or concerns.  Warm regards,  Dr. Delon Lenis, Dermatology       Important Information   Due to recent changes in healthcare laws, you may see results of your pathology and/or laboratory studies on MyChart before the doctors have had a chance to review them. We understand that in some cases there may be results that are confusing or concerning to you. Please understand that not all results are received at the same time and often the doctors may need to interpret multiple results in order to provide you with the best plan of care or course of treatment. Therefore, we ask that you please give us  2 business days to thoroughly review all your results before contacting the office for clarification. Should we see a critical lab result, you will be contacted sooner.     If You Need Anything After Your Visit   If you have any questions or concerns for your doctor, please call our main line at [phone]. If no one answers, please leave a voicemail as directed and we will return your call as soon as possible. Messages left after 4 pm will be answered the following business day.    You may also send us  a message via MyChart. We  typically respond to MyChart messages within 1-2 business days.  For prescription refills, please ask your pharmacy to contact our office. Our fax number is [phone].  If you have an urgent issue when the clinic is closed that cannot wait until the next business day, you can page your doctor at the number below.     Please note that while we do our best to be available for urgent issues outside of office hours, we are not available 24/7.    If you have an urgent issue and are unable to reach us , you may choose to seek medical care at your doctor's office, retail clinic, urgent care center, or emergency room.   If you have a medical emergency, please immediately call 911 or go to the emergency department. In the event of inclement weather, please call our main line at [phone] for an update on the status of any delays or closures.  Dermatology Medication Tips: Please keep the boxes that topical medications come in in order to help keep track of the instructions about where and how to use these. Pharmacies typically print the medication instructions only on the boxes and not directly on the medication tubes.   If your medication is too expensive, please contact our office at [phone] or send us  a message through MyChart.    We are unable to tell  what your co-pay for medications will be in advance as this is different depending on your insurance coverage. However, we may be able to find a substitute medication at lower cost or fill out paperwork to get insurance to cover a needed medication.    If a prior authorization is required to get your medication covered by your insurance company, please allow us  1-2 business days to complete this process.   Drug prices often vary depending on where the prescription is filled and some pharmacies may offer cheaper prices.   The website www.goodrx.com contains coupons for medications through different pharmacies. The prices here do not account  for what the cost may be with help from insurance (it may be cheaper with your insurance), but the website can give you the price if you did not use any insurance.  - You can print the associated coupon and take it with your prescription to the pharmacy.  - You may also stop by our office during regular business hours and pick up a GoodRx coupon card.  - If you need your prescription sent electronically to a different pharmacy, notify our office through [facility] or by phone at [phone]

## 2023-12-05 NOTE — Progress Notes (Signed)
   Follow-Up Visit   Subjective  Renee [name] is a 73 y.o. female who presents for the following: Intertrigo  Patient present today for follow up visit for Intertrigo. Patient was last evaluated on 10/10/23. At this visit patient was prescribed Nystatin -TMC. Patient reports she tried the medication for 2 weeks. She stated that she was previously prescribed the same medication (Nystatin -TMC). She reports the [place] Cicalfate worked well for her when she applied it. Patient reports sxs are improved. Today, she rates her discomfort 3 out of 10 compared to 9 out of 10. Patient was evaluated by OBGYN who prescribed Nystatin  Powder which she feels worked best for. Patient denies medication changes.  The following portions of the chart were reviewed this encounter and updated as appropriate: medications, allergies, medical history  Review of Systems:  No other skin or systemic complaints except as noted in HPI or Assessment and Plan.  Objective  Well appearing patient in no apparent distress; mood and affect are within normal limits.  A focused examination was performed of the following areas: Groin and buttock  Relevant exam findings are noted in the Assessment and Plan.    Assessment & Plan  1. Perineal Irritation / INTERTRIGO - Assessment: Patient reports persistent perineal irritation, primarily in the area just anterior to the anal passage. Symptoms include a sensation of rawness and rubbing, without itching. The irritation is exacerbated by moisture and movement. Previous treatments included nystatin -triamcinolone , which provided only short-term relief. Recent management with keeping the area dry and using powder has improved symptoms, reducing irritation to a 1/10 when dry. Visual examination reveals improvement compared to previous visits.  - Plan:    Continue current management of keeping the area dry    Apply a thin layer of Cicalfate balm in the morning and afternoon to create a  barrier and reduce friction    Use powder to maintain dryness    Continue this regimen for several months to fully heal the affected skin    Reassess at next follow-up visit  2. Skincare Regimen - Assessment: Patient currently uses Dove soap, CeraVe facial cleanser, and occasionally Neutrogena products for facial cleansing. No consistent moisturizer or sunscreen use reported. Patient expresses interest in establishing a proper skincare routine and guidance on makeup use.  - Plan:    Continue using current gentle cleansers (Dove, CeraVe, or Neutrogena)    Start using CeraVe moisturizer:     - CeraVe lotion for summer months     - CeraVe cream for winter months    Incorporate sunscreen into daily routine, especially for extended outdoor activities    Recommend visiting [facility] or [employer] for personalized makeup advice    Educate on importance of removing makeup before sleep    Provide samples of recommended skincare products    Follow up at next visit to assess efficacy of new skincare regimen   Return if symptoms worsen or fail to improve, for Intertrigo.  I, Jetta Ager, am acting as Neurosurgeon for [employer], DO.  Documentation: I have reviewed the above documentation for accuracy and completeness, and I agree with the above.  Delon Lenis, DO

## 2023-12-16 ENCOUNTER — Other Ambulatory Visit: Payer: Self-pay | Admitting: Internal Medicine

## 2023-12-16 DIAGNOSIS — E7849 Other hyperlipidemia: Secondary | ICD-10-CM

## 2023-12-16 DIAGNOSIS — E7801 Familial hypercholesterolemia: Secondary | ICD-10-CM

## 2023-12-16 DIAGNOSIS — I2510 Atherosclerotic heart disease of native coronary artery without angina pectoris: Secondary | ICD-10-CM

## 2024-01-02 NOTE — Progress Notes (Unsigned)
 [name] [address] [place] [address] Phone: [phone] Subjective:   Renee [name], am serving as a scribe for Dr. Arthea Claudene.  I'm seeing this patient by the request  of:  Renee Charleston, MD  CC: Back and neck pain follow-up  YEP:Dlagzrupcz  Renee [name] is a 73 y.o. female coming in with complaint of back and neck pain. OMT 11/08/2023. Patient states that she feels like she needs to stretch more. Trying to declutter her house and doing a lot of bending. Notices tension in the lower back after performing repetitive lumbar flexion.   Medications patient has been prescribed: None  Taking:         Reviewed prior external information including notes and imaging from previsou exam, outside providers and external EMR if available.   As well as notes that were available from care everywhere and other healthcare systems.  Past medical history, social, surgical and family history all reviewed in electronic medical record.  No pertanent information unless stated regarding to the chief complaint.   Past Medical History:  Diagnosis Date   Diabetes mellitus without complication (HCC)    HSV infection    Hyperlipidemia    Hypertension     Allergies  Allergen Reactions   Codeine Nausea Only     Review of Systems:  No headache, visual changes, nausea, vomiting, diarrhea, constipation, dizziness, abdominal pain, skin rash, fevers, chills, night sweats, weight loss, swollen lymph nodes, body aches, joint swelling, chest pain, shortness of breath, mood changes. POSITIVE muscle aches  Objective  Blood pressure 110/78, pulse (!) 59, height 5' 4 (1.626 m), SpO2 100%.   General: No apparent distress alert and oriented x3 mood and affect normal, dressed appropriately.  HEENT: Pupils equal, extraocular movements intact  Respiratory: Patient's speak in full sentences and does not appear short of breath  Cardiovascular: No lower  extremity edema, non tender, no erythema  Gait relatively normal MSK:  Back does have some loss lordosis noted.  Some tenderness to palpation in the paraspinal musculature.  Tightness noted that does affect patient's and especially extension of the back.  Osteopathic findings   T3 extended rotated and side bent right inhaled rib T9 extended rotated and side bent left L2 flexed rotated and side bent right Sacrum right on right     Assessment and Plan:  Low back pain Known moderate arthritic changes seen.  Patient has some signs and symptoms consistent with more of a spinal stenosis.  Discussed icing regimen and home exercises.  Increase activity slowly.  Discussed with patient about icing regimen and home exercises.  Discussed stretching after repetitive motions.  No change in medications.  Follow-up again in 6 to 8 weeks for further evaluation.  Did respond well to osteopathic manipulation for nonallopathic findings    Nonallopathic problems  Decision today to treat with OMT was based on Physical Exam  After verbal consent patient was treated with HVLA, ME, FPR techniques in  rib, thoracic, lumbar, and sacral  areas  Patient tolerated the procedure well with improvement in symptoms  Patient given exercises, stretches and lifestyle modifications  See medications in patient instructions if given  Patient will follow up in 4-8 weeks     The above documentation has been reviewed and is accurate and complete Renee Lininger M Andre Swander, DO         Note: This dictation was prepared with Dragon dictation along with smaller phrase technology. Any transcriptional errors that result from  this process are unintentional.

## 2024-01-03 ENCOUNTER — Ambulatory Visit: Admitting: Family Medicine

## 2024-01-03 ENCOUNTER — Encounter: Payer: Self-pay | Admitting: Family Medicine

## 2024-01-03 VITALS — BP 110/78 | HR 59 | Ht 64.0 in

## 2024-01-03 DIAGNOSIS — M545 Low back pain, unspecified: Secondary | ICD-10-CM

## 2024-01-03 DIAGNOSIS — M9903 Segmental and somatic dysfunction of lumbar region: Secondary | ICD-10-CM

## 2024-01-03 DIAGNOSIS — M9904 Segmental and somatic dysfunction of sacral region: Secondary | ICD-10-CM

## 2024-01-03 DIAGNOSIS — G8929 Other chronic pain: Secondary | ICD-10-CM

## 2024-01-03 DIAGNOSIS — M9902 Segmental and somatic dysfunction of thoracic region: Secondary | ICD-10-CM | POA: Diagnosis not present

## 2024-01-03 NOTE — Assessment & Plan Note (Signed)
 Known moderate arthritic changes seen.  Patient has some signs and symptoms consistent with more of a spinal stenosis.  Discussed icing regimen and home exercises.  Increase activity slowly.  Discussed with patient about icing regimen and home exercises.  Discussed stretching after repetitive motions.  No change in medications.  Follow-up again in 6 to 8 weeks for further evaluation.  Did respond well to osteopathic manipulation for nonallopathic findings

## 2024-01-03 NOTE — Patient Instructions (Signed)
 Stretch hip flexor Remember go go go stretch then sit See me in 2-3 months

## 2024-01-08 LAB — AMB RESULTS CONSOLE CBG: Glucose: 108

## 2024-01-08 LAB — HEMOGLOBIN A1C: Hemoglobin A1C: 14.9

## 2024-01-08 NOTE — Progress Notes (Signed)
 Patient came to mobile screening for Blood pressure and glucose test. [place] Flyer given. No current SDOH needs.

## 2024-01-17 DIAGNOSIS — H16223 Keratoconjunctivitis sicca, not specified as Sjogren's, bilateral: Secondary | ICD-10-CM | POA: Diagnosis not present

## 2024-01-17 DIAGNOSIS — H43813 Vitreous degeneration, bilateral: Secondary | ICD-10-CM | POA: Diagnosis not present

## 2024-01-17 DIAGNOSIS — H40053 Ocular hypertension, bilateral: Secondary | ICD-10-CM | POA: Diagnosis not present

## 2024-01-17 DIAGNOSIS — H2513 Age-related nuclear cataract, bilateral: Secondary | ICD-10-CM | POA: Diagnosis not present

## 2024-02-04 ENCOUNTER — Telehealth: Payer: Self-pay | Admitting: Internal Medicine

## 2024-02-04 DIAGNOSIS — E7801 Familial hypercholesterolemia: Secondary | ICD-10-CM

## 2024-02-04 NOTE — Telephone Encounter (Signed)
 Patient calling to have a lipid panel done. She states that her energy has been exteremly low. Please advise

## 2024-02-04 NOTE — Telephone Encounter (Signed)
 Patient did not have Lipid panel drawn in February as recommended at office visit. Order now expired.   Lipid panel ordered and released to [facility]. Patient states she will have fasting labs drawn tomorrow.  She also reports feeling run down for the past several months now. She will be seeing her PCP in mid September for her annual physical. Advised her to discuss this with PCP, patient verbalized understanding.

## 2024-02-15 DIAGNOSIS — E7801 Familial hypercholesterolemia: Secondary | ICD-10-CM | POA: Diagnosis not present

## 2024-02-15 LAB — LIPID PANEL

## 2024-02-16 ENCOUNTER — Ambulatory Visit: Payer: Self-pay | Admitting: Internal Medicine

## 2024-02-16 LAB — LIPID PANEL
Chol/HDL Ratio: 2.3 ratio (ref 0.0–4.4)
Cholesterol, Total: 159 mg/dL (ref 100–199)
HDL: 68 mg/dL (ref >39)
LDL Chol Calc (NIH): 82 mg/dL (ref 0–99)
Triglycerides: 40 mg/dL (ref 0–149)
VLDL Cholesterol Cal: 9 mg/dL (ref 5–40)

## 2024-02-26 DIAGNOSIS — E119 Type 2 diabetes mellitus without complications: Secondary | ICD-10-CM | POA: Diagnosis not present

## 2024-02-26 DIAGNOSIS — Z1331 Encounter for screening for depression: Secondary | ICD-10-CM | POA: Diagnosis not present

## 2024-02-26 DIAGNOSIS — Z0000 Encounter for general adult medical examination without abnormal findings: Secondary | ICD-10-CM | POA: Diagnosis not present

## 2024-02-26 DIAGNOSIS — E785 Hyperlipidemia, unspecified: Secondary | ICD-10-CM | POA: Diagnosis not present

## 2024-02-26 DIAGNOSIS — Z1211 Encounter for screening for malignant neoplasm of colon: Secondary | ICD-10-CM | POA: Diagnosis not present

## 2024-02-26 DIAGNOSIS — R911 Solitary pulmonary nodule: Secondary | ICD-10-CM | POA: Diagnosis not present

## 2024-02-26 DIAGNOSIS — I10 Essential (primary) hypertension: Secondary | ICD-10-CM | POA: Diagnosis not present

## 2024-02-26 DIAGNOSIS — Z6830 Body mass index (BMI) 30.0-30.9, adult: Secondary | ICD-10-CM | POA: Diagnosis not present

## 2024-02-26 DIAGNOSIS — I2510 Atherosclerotic heart disease of native coronary artery without angina pectoris: Secondary | ICD-10-CM | POA: Diagnosis not present

## 2024-03-05 ENCOUNTER — Ambulatory Visit: Admitting: Family Medicine

## 2024-03-05 NOTE — Progress Notes (Signed)
 The patient attended a screening event on 01/08/2024 where her BP screening results was 119/71, non-fasting glucose was 108 and hemoglobin A1C 6.0. At the event the patient noted she has [employer] and does not smoke. Patient did not have any SDOH insecurities. Pt did not list pcp. At the event pt was given [employer] flyer by clinician.  Per chart review pt pcp is Lamar Ng, MD and the last office visit was 08/17/2023 for essential hypertension at [facility]. According to chart pt is currently on amlodipine  and Valsartan -hydrochlorothiazide  to manage hypertension. Post event initial f/u [facility] called pt pcp office on 03/05/2024 to confirm that pt was last seen on 02/26/2024 and pt has a future appt on 03/03/2025 with pcp. Chart review also indicates that pt is being seen by cardiologist and endocrinologist. No additional Health equity team support indicated at this time.

## 2024-03-11 DIAGNOSIS — H2513 Age-related nuclear cataract, bilateral: Secondary | ICD-10-CM | POA: Diagnosis not present

## 2024-03-11 DIAGNOSIS — H43813 Vitreous degeneration, bilateral: Secondary | ICD-10-CM | POA: Diagnosis not present

## 2024-03-11 DIAGNOSIS — H16223 Keratoconjunctivitis sicca, not specified as Sjogren's, bilateral: Secondary | ICD-10-CM | POA: Diagnosis not present

## 2024-03-11 DIAGNOSIS — H40053 Ocular hypertension, bilateral: Secondary | ICD-10-CM | POA: Diagnosis not present

## 2024-03-13 ENCOUNTER — Other Ambulatory Visit: Payer: Self-pay | Admitting: Internal Medicine

## 2024-03-20 ENCOUNTER — Encounter: Payer: Self-pay | Admitting: Family Medicine

## 2024-03-20 ENCOUNTER — Ambulatory Visit: Admitting: Family Medicine

## 2024-03-20 VITALS — BP 110/80 | HR 76 | Ht 64.0 in | Wt 167.0 lb

## 2024-03-20 DIAGNOSIS — L84 Corns and callosities: Secondary | ICD-10-CM

## 2024-03-20 DIAGNOSIS — M9902 Segmental and somatic dysfunction of thoracic region: Secondary | ICD-10-CM

## 2024-03-20 DIAGNOSIS — M9901 Segmental and somatic dysfunction of cervical region: Secondary | ICD-10-CM | POA: Diagnosis not present

## 2024-03-20 DIAGNOSIS — M9904 Segmental and somatic dysfunction of sacral region: Secondary | ICD-10-CM | POA: Diagnosis not present

## 2024-03-20 DIAGNOSIS — M79672 Pain in left foot: Secondary | ICD-10-CM | POA: Diagnosis not present

## 2024-03-20 DIAGNOSIS — M9908 Segmental and somatic dysfunction of rib cage: Secondary | ICD-10-CM | POA: Diagnosis not present

## 2024-03-20 DIAGNOSIS — G8929 Other chronic pain: Secondary | ICD-10-CM

## 2024-03-20 DIAGNOSIS — M545 Low back pain, unspecified: Secondary | ICD-10-CM | POA: Diagnosis not present

## 2024-03-20 DIAGNOSIS — M9903 Segmental and somatic dysfunction of lumbar region: Secondary | ICD-10-CM | POA: Diagnosis not present

## 2024-03-20 NOTE — Assessment & Plan Note (Signed)
 Low back does have some loss of lordosis noted.  Tightness noted.  Will continue to monitor.  No change in medications.  Follow-up again in 6 to 8 weeks

## 2024-03-20 NOTE — Patient Instructions (Addendum)
 Good to see you. Podiatry referral to [facility] [place] over at [facility]. Tylenol  3 times a day.  Have a great Friday! See me again in 2 months.

## 2024-03-20 NOTE — Progress Notes (Signed)
  [facility]arly[facility] Claud[facility] [address] [place] [address] Phone: [phone] Subjective:   Renee [name] am a scribe for Dr. Claudene.   I'm seeing this patient by the request  of:  Frederik Charleston, MD  CC: Back and neck pain follow-up  YEP:Dlagzrupcz  Renee [name] is a 73 y.o. female coming in with complaint of back and neck pain. OMT 01/03/2024. Patient states here for MSK. Wants advice on shoes that she brought with her. Would like a referral for the corn on her foot.   Medications patient has been prescribed: None  Taking:         Reviewed prior external information including notes and imaging from previsou exam, outside providers and external EMR if available.   As well as notes that were available from care everywhere and other healthcare systems.  Past medical history, social, surgical and family history all reviewed in electronic medical record.  No pertanent information unless stated regarding to the chief complaint.   Past Medical History:  Diagnosis Date   Diabetes mellitus without complication (HCC)    HSV infection    Hyperlipidemia    Hypertension     Allergies  Allergen Reactions   Codeine Nausea Only     Review of Systems:  No headache, visual changes, nausea, vomiting, diarrhea, constipation, dizziness, abdominal pain, skin rash, fevers, chills, night sweats, weight loss, swollen lymph nodes, body aches, joint swelling, chest pain, shortness of breath, mood changes. POSITIVE muscle aches  Objective  Blood pressure 110/80, pulse 76, height 5' 4 (1.626 m), weight 167 lb (75.8 kg), SpO2 96%.   General: No apparent distress alert and oriented x3 mood and affect normal, dressed appropriately.  HEENT: Pupils equal, extraocular movements intact  Respiratory: Patient's speak in full sentences and does not appear short of breath  Cardiovascular: No lower extremity edema, non tender, no erythema  Back does have some loss  lordosis noted.  Tenderness to palpation noted.  Patient does have some loss lordosis noted.  Tightness with FABER test right greater than left.  Osteopathic findings  C2 flexed rotated and side bent right C6 flexed rotated and side bent left T3 extended rotated and side bent right inhaled rib T9 extended rotated and side bent left L2 flexed rotated and side bent right Sacrum right on right       Assessment and Plan:  Low back pain Low back does have some loss of lordosis noted.  Tightness noted.  Will continue to monitor.  No change in medications.  Follow-up again in 6 to 8 weeks    Nonallopathic problems  Decision today to treat with OMT was based on Physical Exam  After verbal consent patient was treated with HVLA, ME, FPR techniques in cervical, rib, thoracic, lumbar, and sacral  areas avoided HVLA on the neck.  Patient tolerated the procedure well with improvement in symptoms  Patient given exercises, stretches and lifestyle modifications  See medications in patient instructions if given  Patient will follow up in 4-8 weeks   Foot corn will send him to podiatry to have removed.    The above documentation has been reviewed and is accurate and complete Renee Banko M Magaby Rumberger, DO         Note: This dictation was prepared with Dragon dictation along with smaller phrase technology. Any transcriptional errors that result from this process are unintentional.

## 2024-03-26 DIAGNOSIS — Z1211 Encounter for screening for malignant neoplasm of colon: Secondary | ICD-10-CM | POA: Diagnosis not present

## 2024-03-27 DIAGNOSIS — I10 Essential (primary) hypertension: Secondary | ICD-10-CM | POA: Diagnosis not present

## 2024-03-27 DIAGNOSIS — R5382 Chronic fatigue, unspecified: Secondary | ICD-10-CM | POA: Diagnosis not present

## 2024-04-09 DIAGNOSIS — H2512 Age-related nuclear cataract, left eye: Secondary | ICD-10-CM | POA: Diagnosis not present

## 2024-04-09 DIAGNOSIS — H2513 Age-related nuclear cataract, bilateral: Secondary | ICD-10-CM | POA: Diagnosis not present

## 2024-04-09 DIAGNOSIS — H04123 Dry eye syndrome of bilateral lacrimal glands: Secondary | ICD-10-CM | POA: Diagnosis not present

## 2024-04-09 DIAGNOSIS — E119 Type 2 diabetes mellitus without complications: Secondary | ICD-10-CM | POA: Diagnosis not present

## 2024-04-09 DIAGNOSIS — H40053 Ocular hypertension, bilateral: Secondary | ICD-10-CM | POA: Diagnosis not present

## 2024-04-15 DIAGNOSIS — M19072 Primary osteoarthritis, left ankle and foot: Secondary | ICD-10-CM | POA: Diagnosis not present

## 2024-04-15 DIAGNOSIS — M7741 Metatarsalgia, right foot: Secondary | ICD-10-CM | POA: Diagnosis not present

## 2024-04-22 ENCOUNTER — Telehealth: Payer: Self-pay | Admitting: Internal Medicine

## 2024-04-22 MED ORDER — AMLODIPINE BESYLATE 5 MG PO TABS: 5.0000 mg | ORAL_TABLET | Freq: Every day | ORAL | 1 refills | Status: AC

## 2024-04-22 NOTE — Telephone Encounter (Signed)
*  STAT* If patient is at the pharmacy, call can be transferred to refill team.   1. Which medications need to be refilled? (please list name of each medication and dose if known) amLODipine  (NORVASC ) 5 MG tablet    2. Would you like to learn more about the convenience, safety, & potential cost savings by using the [facility]? no   3. Are you open to using the [facility] (Type [facility].  ).no    4. Which pharmacy/location (including street and city if local pharmacy) is medication to be sent to?[id number] - [place], [place] - [address] AT [address] OF [facility] [address]    5. Do they need a 30 day or 90 day supply? 90 day    Pt is out of medication

## 2024-04-22 NOTE — Telephone Encounter (Signed)
 Refill sent in

## 2024-04-25 DIAGNOSIS — L089 Local infection of the skin and subcutaneous tissue, unspecified: Secondary | ICD-10-CM | POA: Diagnosis not present

## 2024-05-21 ENCOUNTER — Ambulatory Visit: Admitting: Family Medicine

## 2024-05-28 NOTE — Progress Notes (Unsigned)
°  [facility] [address] [place] [address] Phone: [phone] Subjective:    I'm seeing this patient by the request  of:  Frederik Charleston, MD  CC:   Renee [name]  Alexica Schlossberg is a 73 y.o. female coming in with complaint of back and neck pain. OMT 03/20/2024. Patient states   Medications patient has been prescribed: None  Taking:         Reviewed prior external information including notes and imaging from previsou exam, outside providers and external EMR if available.   As well as notes that were available from care everywhere and other healthcare systems.  Past medical history, social, surgical and family history all reviewed in electronic medical record.  No pertanent information unless stated regarding to the chief complaint.   Past Medical History:  Diagnosis Date   Diabetes mellitus without complication (HCC)    HSV infection    Hyperlipidemia    Hypertension     Allergies[1]   Review of Systems:  No headache, visual changes, nausea, vomiting, diarrhea, constipation, dizziness, abdominal pain, skin rash, fevers, chills, night sweats, weight loss, swollen lymph nodes, body aches, joint swelling, chest pain, shortness of breath, mood changes. POSITIVE muscle aches  Objective  There were no vitals taken for this visit.   General: No apparent distress alert and oriented x3 mood and affect normal, dressed appropriately.  HEENT: Pupils equal, extraocular movements intact  Respiratory: Patient's speak in full sentences and does not appear short of breath  Cardiovascular: No lower extremity edema, non tender, no erythema  Gait MSK:  Back   Osteopathic findings  C2 flexed rotated and side bent right C6 flexed rotated and side bent left T3 extended rotated and side bent right inhaled rib T9 extended rotated and side bent left L2 flexed rotated and side bent right Sacrum right on right       Assessment and  Plan:  No problem-specific Assessment & Plan notes found for this encounter.    Nonallopathic problems  Decision today to treat with OMT was based on Physical Exam  After verbal consent patient was treated with HVLA, ME, FPR techniques in cervical, rib, thoracic, lumbar, and sacral  areas  Patient tolerated the procedure well with improvement in symptoms  Patient given exercises, stretches and lifestyle modifications  See medications in patient instructions if given  Patient will follow up in 4-8 weeks             Note: This dictation was prepared with Dragon dictation along with smaller phrase technology. Any transcriptional errors that result from this process are unintentional.            [1]  Allergies Allergen Reactions   Codeine Nausea Only

## 2024-06-02 ENCOUNTER — Encounter: Payer: Self-pay | Admitting: Family Medicine

## 2024-06-02 ENCOUNTER — Ambulatory Visit: Admitting: Family Medicine

## 2024-06-02 VITALS — BP 118/78 | HR 72 | Ht 64.0 in

## 2024-06-02 DIAGNOSIS — M9904 Segmental and somatic dysfunction of sacral region: Secondary | ICD-10-CM | POA: Diagnosis not present

## 2024-06-02 DIAGNOSIS — M9903 Segmental and somatic dysfunction of lumbar region: Secondary | ICD-10-CM

## 2024-06-02 DIAGNOSIS — M9901 Segmental and somatic dysfunction of cervical region: Secondary | ICD-10-CM

## 2024-06-02 DIAGNOSIS — M545 Low back pain, unspecified: Secondary | ICD-10-CM | POA: Diagnosis not present

## 2024-06-02 DIAGNOSIS — M9902 Segmental and somatic dysfunction of thoracic region: Secondary | ICD-10-CM

## 2024-06-02 DIAGNOSIS — M9908 Segmental and somatic dysfunction of rib cage: Secondary | ICD-10-CM

## 2024-06-02 DIAGNOSIS — G8929 Other chronic pain: Secondary | ICD-10-CM | POA: Diagnosis not present

## 2024-06-02 NOTE — Patient Instructions (Signed)
 See me in 2 months

## 2024-06-02 NOTE — Assessment & Plan Note (Signed)
 Discussed icing regimen and home exercises, which activities to do and which ones to avoid.  Continue to stay active.  Increase activity slowly.  Follow-up again in 6 to 8 weeks.

## 2024-06-13 ENCOUNTER — Other Ambulatory Visit (HOSPITAL_BASED_OUTPATIENT_CLINIC_OR_DEPARTMENT_OTHER): Payer: Self-pay

## 2024-07-18 ENCOUNTER — Telehealth: Payer: Self-pay | Admitting: Internal Medicine

## 2024-07-18 DIAGNOSIS — T466X5A Adverse effect of antihyperlipidemic and antiarteriosclerotic drugs, initial encounter: Secondary | ICD-10-CM

## 2024-07-18 DIAGNOSIS — I2510 Atherosclerotic heart disease of native coronary artery without angina pectoris: Secondary | ICD-10-CM

## 2024-07-18 DIAGNOSIS — E7849 Other hyperlipidemia: Secondary | ICD-10-CM

## 2024-07-18 DIAGNOSIS — I10 Essential (primary) hypertension: Secondary | ICD-10-CM

## 2024-07-18 NOTE — Addendum Note (Signed)
 Addended by: JOSHUA ANDREZ PARAS on: 07/18/2024 09:33 AM   Modules accepted: Orders

## 2024-07-18 NOTE — Telephone Encounter (Signed)
Ordered labs and informed pt.

## 2024-07-18 NOTE — Telephone Encounter (Signed)
 Patient is calling to see if she needs any labs done before her 02/12 appt

## 2024-07-24 ENCOUNTER — Ambulatory Visit: Admitting: Internal Medicine

## 2024-08-04 ENCOUNTER — Ambulatory Visit: Admitting: Family Medicine
